# Patient Record
Sex: Male | Born: 1935 | Race: White | Hispanic: No | Marital: Married | Smoking: Former smoker
Health system: Southern US, Community
[De-identification: ages and names within clinical notes are randomized; demographics above are authoritative.]

## PROBLEM LIST (undated history)

## (undated) DIAGNOSIS — R410 Disorientation, unspecified: Secondary | ICD-10-CM

## (undated) DIAGNOSIS — M1990 Unspecified osteoarthritis, unspecified site: Secondary | ICD-10-CM

## (undated) DIAGNOSIS — E785 Hyperlipidemia, unspecified: Secondary | ICD-10-CM

## (undated) DIAGNOSIS — Z8619 Personal history of other infectious and parasitic diseases: Secondary | ICD-10-CM

## (undated) DIAGNOSIS — G9341 Metabolic encephalopathy: Secondary | ICD-10-CM

## (undated) DIAGNOSIS — I951 Orthostatic hypotension: Secondary | ICD-10-CM

## (undated) DIAGNOSIS — I2510 Atherosclerotic heart disease of native coronary artery without angina pectoris: Secondary | ICD-10-CM

## (undated) DIAGNOSIS — Z9289 Personal history of other medical treatment: Secondary | ICD-10-CM

## (undated) DIAGNOSIS — I10 Essential (primary) hypertension: Secondary | ICD-10-CM

## (undated) DIAGNOSIS — C801 Malignant (primary) neoplasm, unspecified: Secondary | ICD-10-CM

## (undated) HISTORY — DX: Metabolic encephalopathy: G93.41

## (undated) HISTORY — DX: Unspecified osteoarthritis, unspecified site: M19.90

## (undated) HISTORY — DX: Hyperlipidemia, unspecified: E78.5

## (undated) HISTORY — DX: Personal history of other medical treatment: Z92.89

## (undated) HISTORY — DX: Personal history of other infectious and parasitic diseases: Z86.19

## (undated) HISTORY — PX: LUMBAR DISC SURGERY: SHX700

## (undated) HISTORY — PX: BACK SURGERY: SHX140

## (undated) HISTORY — DX: Disorientation, unspecified: R41.0

## (undated) HISTORY — DX: Orthostatic hypotension: I95.1

## (undated) HISTORY — DX: Atherosclerotic heart disease of native coronary artery without angina pectoris: I25.10

## (undated) HISTORY — DX: Essential (primary) hypertension: I10

## (undated) HISTORY — PX: OTHER SURGICAL HISTORY: SHX169

---

## 1997-08-26 ENCOUNTER — Ambulatory Visit (HOSPITAL_COMMUNITY): Admission: RE | Admit: 1997-08-26 | Discharge: 1997-08-26 | Payer: Self-pay | Admitting: *Deleted

## 1997-09-09 ENCOUNTER — Ambulatory Visit (HOSPITAL_COMMUNITY): Admission: RE | Admit: 1997-09-09 | Discharge: 1997-09-09 | Payer: Self-pay | Admitting: *Deleted

## 1997-09-29 ENCOUNTER — Ambulatory Visit (HOSPITAL_COMMUNITY): Admission: RE | Admit: 1997-09-29 | Discharge: 1997-09-29 | Payer: Self-pay | Admitting: *Deleted

## 1998-01-04 ENCOUNTER — Encounter: Payer: Self-pay | Admitting: *Deleted

## 1998-01-04 ENCOUNTER — Inpatient Hospital Stay (HOSPITAL_COMMUNITY): Admission: RE | Admit: 1998-01-04 | Discharge: 1998-01-05 | Payer: Self-pay | Admitting: *Deleted

## 1999-03-19 ENCOUNTER — Encounter: Admission: RE | Admit: 1999-03-19 | Discharge: 1999-03-19 | Payer: Self-pay | Admitting: Family Medicine

## 1999-03-20 ENCOUNTER — Encounter: Admission: RE | Admit: 1999-03-20 | Discharge: 1999-03-20 | Payer: Self-pay | Admitting: Family Medicine

## 1999-03-20 ENCOUNTER — Encounter: Payer: Self-pay | Admitting: Family Medicine

## 1999-04-16 HISTORY — PX: CORONARY ARTERY BYPASS GRAFT: SHX141

## 1999-04-17 ENCOUNTER — Inpatient Hospital Stay (HOSPITAL_COMMUNITY): Admission: EM | Admit: 1999-04-17 | Discharge: 1999-04-26 | Payer: Self-pay | Admitting: Emergency Medicine

## 1999-04-17 ENCOUNTER — Encounter: Payer: Self-pay | Admitting: Psychiatry

## 1999-04-20 ENCOUNTER — Encounter: Payer: Self-pay | Admitting: Thoracic Surgery (Cardiothoracic Vascular Surgery)

## 1999-04-21 ENCOUNTER — Encounter: Payer: Self-pay | Admitting: Thoracic Surgery (Cardiothoracic Vascular Surgery)

## 1999-04-22 ENCOUNTER — Encounter: Payer: Self-pay | Admitting: Thoracic Surgery (Cardiothoracic Vascular Surgery)

## 1999-04-23 ENCOUNTER — Encounter: Payer: Self-pay | Admitting: Thoracic Surgery (Cardiothoracic Vascular Surgery)

## 1999-04-24 ENCOUNTER — Encounter: Payer: Self-pay | Admitting: Thoracic Surgery (Cardiothoracic Vascular Surgery)

## 1999-04-25 ENCOUNTER — Encounter: Payer: Self-pay | Admitting: Thoracic Surgery (Cardiothoracic Vascular Surgery)

## 2005-05-09 ENCOUNTER — Ambulatory Visit: Payer: Self-pay | Admitting: Family Medicine

## 2005-06-27 ENCOUNTER — Ambulatory Visit: Payer: Self-pay | Admitting: Family Medicine

## 2005-06-29 ENCOUNTER — Encounter: Admission: RE | Admit: 2005-06-29 | Discharge: 2005-06-29 | Payer: Self-pay | Admitting: Family Medicine

## 2006-01-02 ENCOUNTER — Ambulatory Visit: Payer: Self-pay | Admitting: Family Medicine

## 2006-02-02 ENCOUNTER — Encounter: Admission: RE | Admit: 2006-02-02 | Discharge: 2006-02-02 | Payer: Self-pay | Admitting: Family Medicine

## 2006-03-12 ENCOUNTER — Ambulatory Visit (HOSPITAL_COMMUNITY): Admission: RE | Admit: 2006-03-12 | Discharge: 2006-03-13 | Payer: Self-pay | Admitting: Orthopaedic Surgery

## 2007-03-31 ENCOUNTER — Ambulatory Visit: Payer: Self-pay | Admitting: Family Medicine

## 2007-03-31 DIAGNOSIS — I2510 Atherosclerotic heart disease of native coronary artery without angina pectoris: Secondary | ICD-10-CM

## 2007-03-31 DIAGNOSIS — K589 Irritable bowel syndrome without diarrhea: Secondary | ICD-10-CM | POA: Insufficient documentation

## 2007-03-31 DIAGNOSIS — I10 Essential (primary) hypertension: Secondary | ICD-10-CM

## 2007-03-31 DIAGNOSIS — E785 Hyperlipidemia, unspecified: Secondary | ICD-10-CM

## 2007-04-13 ENCOUNTER — Ambulatory Visit: Payer: Self-pay | Admitting: Gastroenterology

## 2007-04-16 LAB — HM COLONOSCOPY: HM Colonoscopy: NORMAL

## 2007-04-27 ENCOUNTER — Ambulatory Visit: Payer: Self-pay | Admitting: Gastroenterology

## 2007-04-27 ENCOUNTER — Encounter: Payer: Self-pay | Admitting: Family Medicine

## 2007-05-19 ENCOUNTER — Ambulatory Visit: Payer: Self-pay | Admitting: Gastroenterology

## 2007-05-19 LAB — CONVERTED CEMR LAB
ALT: 14 units/L (ref 0–53)
AST: 23 units/L (ref 0–37)
Albumin: 2.9 g/dL — ABNORMAL LOW (ref 3.5–5.2)
Alkaline Phosphatase: 73 units/L (ref 39–117)
Amylase: 47 units/L (ref 27–131)
BUN: 11 mg/dL (ref 6–23)
Basophils Relative: 0 % (ref 0.0–1.0)
Calcium: 8.7 mg/dL (ref 8.4–10.5)
Chloride: 106 meq/L (ref 96–112)
Eosinophils Relative: 7.5 % — ABNORMAL HIGH (ref 0.0–5.0)
Ferritin: 8.7 ng/mL — ABNORMAL LOW (ref 22.0–322.0)
Folate: 7.2 ng/mL
GFR calc non Af Amer: 87 mL/min
Iron: 41 ug/dL — ABNORMAL LOW (ref 42–165)
Lipase: 16 units/L (ref 11.0–59.0)
Monocytes Relative: 6.6 % (ref 3.0–11.0)
Platelets: 225 10*3/uL (ref 150–400)
RDW: 14.5 % (ref 11.5–14.6)
Sed Rate: 7 mm/hr (ref 0–20)
TSH: 2.9 microintl units/mL (ref 0.35–5.50)
Vitamin B-12: 223 pg/mL (ref 211–911)
WBC: 5.7 10*3/uL (ref 4.5–10.5)

## 2007-05-20 ENCOUNTER — Ambulatory Visit: Payer: Self-pay | Admitting: Gastroenterology

## 2007-05-20 ENCOUNTER — Encounter: Payer: Self-pay | Admitting: Family Medicine

## 2007-05-20 ENCOUNTER — Encounter: Payer: Self-pay | Admitting: Gastroenterology

## 2007-05-21 ENCOUNTER — Ambulatory Visit: Payer: Self-pay | Admitting: Cardiology

## 2007-05-22 ENCOUNTER — Observation Stay (HOSPITAL_COMMUNITY): Admission: EM | Admit: 2007-05-22 | Discharge: 2007-05-23 | Payer: Self-pay | Admitting: Internal Medicine

## 2007-05-22 ENCOUNTER — Ambulatory Visit: Payer: Self-pay | Admitting: Vascular Surgery

## 2007-05-29 ENCOUNTER — Ambulatory Visit: Payer: Self-pay | Admitting: Internal Medicine

## 2007-06-10 ENCOUNTER — Encounter: Payer: Self-pay | Admitting: Family Medicine

## 2007-06-10 ENCOUNTER — Ambulatory Visit: Payer: Self-pay | Admitting: Vascular Surgery

## 2007-07-16 ENCOUNTER — Encounter: Payer: Self-pay | Admitting: Family Medicine

## 2007-07-17 ENCOUNTER — Encounter: Payer: Self-pay | Admitting: Family Medicine

## 2007-11-11 ENCOUNTER — Ambulatory Visit: Payer: Self-pay | Admitting: Family Medicine

## 2007-11-11 DIAGNOSIS — Z8711 Personal history of peptic ulcer disease: Secondary | ICD-10-CM

## 2007-11-11 DIAGNOSIS — G609 Hereditary and idiopathic neuropathy, unspecified: Secondary | ICD-10-CM

## 2007-11-11 DIAGNOSIS — M949 Disorder of cartilage, unspecified: Secondary | ICD-10-CM

## 2007-11-11 DIAGNOSIS — M899 Disorder of bone, unspecified: Secondary | ICD-10-CM | POA: Insufficient documentation

## 2007-11-11 DIAGNOSIS — D649 Anemia, unspecified: Secondary | ICD-10-CM | POA: Insufficient documentation

## 2007-11-11 DIAGNOSIS — K5909 Other constipation: Secondary | ICD-10-CM

## 2007-11-11 DIAGNOSIS — E039 Hypothyroidism, unspecified: Secondary | ICD-10-CM

## 2007-11-16 LAB — CONVERTED CEMR LAB
Basophils Absolute: 0 10*3/uL (ref 0.0–0.1)
Hemoglobin: 12.7 g/dL — ABNORMAL LOW (ref 13.0–17.0)
Lymphocytes Relative: 27.2 % (ref 12.0–46.0)
MCHC: 35 g/dL (ref 30.0–36.0)
Neutro Abs: 2.5 10*3/uL (ref 1.4–7.7)
RDW: 13.2 % (ref 11.5–14.6)
Vitamin B-12: 204 pg/mL — ABNORMAL LOW (ref 211–911)

## 2007-12-22 ENCOUNTER — Telehealth: Payer: Self-pay | Admitting: Family Medicine

## 2008-04-26 ENCOUNTER — Encounter: Payer: Self-pay | Admitting: Family Medicine

## 2008-06-03 ENCOUNTER — Ambulatory Visit: Payer: Self-pay | Admitting: Family Medicine

## 2008-06-03 DIAGNOSIS — M67919 Unspecified disorder of synovium and tendon, unspecified shoulder: Secondary | ICD-10-CM | POA: Insufficient documentation

## 2008-06-03 DIAGNOSIS — M719 Bursopathy, unspecified: Secondary | ICD-10-CM

## 2008-07-20 ENCOUNTER — Ambulatory Visit: Payer: Self-pay | Admitting: Family Medicine

## 2008-07-20 DIAGNOSIS — M129 Arthropathy, unspecified: Secondary | ICD-10-CM

## 2008-07-20 DIAGNOSIS — M19019 Primary osteoarthritis, unspecified shoulder: Secondary | ICD-10-CM | POA: Insufficient documentation

## 2008-07-20 DIAGNOSIS — M7500 Adhesive capsulitis of unspecified shoulder: Secondary | ICD-10-CM

## 2008-07-20 LAB — CONVERTED CEMR LAB: Hemoglobin: 12.1 g/dL

## 2008-08-01 ENCOUNTER — Encounter: Payer: Self-pay | Admitting: Family Medicine

## 2008-09-20 DIAGNOSIS — Z9289 Personal history of other medical treatment: Secondary | ICD-10-CM

## 2008-09-20 HISTORY — DX: Personal history of other medical treatment: Z92.89

## 2009-02-23 ENCOUNTER — Ambulatory Visit: Payer: Self-pay | Admitting: Family Medicine

## 2009-02-23 DIAGNOSIS — R42 Dizziness and giddiness: Secondary | ICD-10-CM | POA: Insufficient documentation

## 2009-03-03 ENCOUNTER — Encounter: Payer: Self-pay | Admitting: Family Medicine

## 2009-04-04 ENCOUNTER — Telehealth: Payer: Self-pay | Admitting: Family Medicine

## 2009-11-17 ENCOUNTER — Ambulatory Visit: Payer: Self-pay | Admitting: Family Medicine

## 2009-11-17 LAB — CONVERTED CEMR LAB
Bilirubin Urine: NEGATIVE
Ketones, urine, test strip: NEGATIVE
Protein, U semiquant: NEGATIVE
Urobilinogen, UA: 0.2

## 2009-11-21 ENCOUNTER — Encounter: Payer: Self-pay | Admitting: Family Medicine

## 2009-11-21 LAB — CONVERTED CEMR LAB
ALT: 15 units/L (ref 0–53)
Basophils Relative: 0.4 % (ref 0.0–3.0)
Bilirubin, Direct: 0.1 mg/dL (ref 0.0–0.3)
CO2: 30 meq/L (ref 19–32)
Calcium: 10.6 mg/dL — ABNORMAL HIGH (ref 8.4–10.5)
Eosinophils Relative: 2 % (ref 0.0–5.0)
HCT: 44 % (ref 39.0–52.0)
Lymphs Abs: 1.8 10*3/uL (ref 0.7–4.0)
MCV: 93.7 fL (ref 78.0–100.0)
Monocytes Absolute: 0.5 10*3/uL (ref 0.1–1.0)
Potassium: 5.5 meq/L — ABNORMAL HIGH (ref 3.5–5.1)
RBC: 4.7 M/uL (ref 4.22–5.81)
Sodium: 142 meq/L (ref 135–145)
Total Protein: 7.8 g/dL (ref 6.0–8.3)
WBC: 7 10*3/uL (ref 4.5–10.5)

## 2009-11-29 ENCOUNTER — Ambulatory Visit: Payer: Self-pay | Admitting: Family Medicine

## 2009-11-29 LAB — CONVERTED CEMR LAB
Albumin: 3.6 g/dL (ref 3.5–5.2)
Creatinine, Ser: 0.9 mg/dL (ref 0.4–1.5)
Glucose, Bld: 81 mg/dL (ref 70–99)
Phosphorus: 3.3 mg/dL (ref 2.3–4.6)
Potassium: 4 meq/L (ref 3.5–5.1)
Sodium: 142 meq/L (ref 135–145)

## 2009-12-21 ENCOUNTER — Ambulatory Visit: Payer: Self-pay | Admitting: Family Medicine

## 2010-05-06 ENCOUNTER — Encounter: Payer: Self-pay | Admitting: Gastroenterology

## 2010-05-15 ENCOUNTER — Encounter: Payer: Self-pay | Admitting: Family Medicine

## 2010-05-15 NOTE — Letter (Signed)
Summary: Generic Letter  [place] [facility]  [address] [place], [place] [address]   Phone: [phone]  Fax: [phone]    11/21/2009  Austin Rose [address] [place], [place]  [address]  Dear [name],   Dr Abner Greenspan would like you to know that your TSH and CBC are normal. Your calcium and potassium are midly high, so please decrease these in your diet.  Dr Abner Greenspan would like you to come back towards the end of this week or beginning of next week to recheck your renal panel. Please contact our office for an appointment in the lab.  Any other questions or concerns please call [phone].     Sincerely,   Josph Macho RMA

## 2010-05-15 NOTE — Assessment & Plan Note (Signed)
Summary: 77yo ROV/cb   Vital Signs:  Patient profile:   77 year old male Height:      68 inches (172.72 cm) Weight:      162 pounds (73.64 kg) O2 Sat:      98 % on Room air Temp:     98.0 degrees F (36.67 degrees C) oral Pulse rate:   60 / minute BP sitting:   100 / 66  (left arm) Cuff size:   regular  Vitals Entered By: Josph Macho RMA (December 21, 2009 11:28 AM)  O2 Flow:  Room air CC: 1 month office visit/ CF Is Patient Diabetic? No   History of Present Illness: Patient in today for reevaluation. Last month he came in feeling weak and light headed. He was noted to be dry and on multiple meds tha tcould make him light headed. We dropped his Temazepam down from 30 to 15 mg and he is reporting that he continues to sleep well and is having decreased episodes of feeling light headed. The episodes often occur with arising and quick changes in position. No associated symptoms such as syncope, presyncope, palpitations, CP, SOB, HA or other neurologic complaints. He reports he has improved his by mouth  intake is eating more meals and less snack food and has increased his clear fluid intake. His only complaint of is his persistent burning in his feet and an occasional twitch in his left foot usually when he is sleeping at night. He denies pain/swelling but notes if he gets up and walks around and takes a Lorcet the twitch resolves. [facility] in [facility] couple weeks. He continues to have some heartburn at times but did not pick up his Ranitidine. He reports he forgot we sent it in.  Current Medications (verified): 1)  Simvastatin 40 Mg  Tabs (Simvastatin) .... 1/2 Tablet Daily 2)  Lorcet 10/650 10-650 Mg  Tabs (Hydrocodone-Acetaminophen) .[name].. 1 Q4h As Needed Pain ,not Over 4 Per Day 3)  Ferrous Sulfate 325 (65 Fe) Mg  Tabs (Ferrous Sulfate) 4)  B Complex Vitamins  Caps (B Complex Vitamins) .[name].. 1 Bid 5)  Temazepam 15 Mg Caps (Temazepam) .[name].. 1 Cap By Mouth At Bedtime As  Needed Insomnia 6)  Ranitidine Hcl 150 Mg Tabs (Ranitidine Hcl) .[name].. 1 Tab By Mouth Bid  Allergies (verified): 1)  ! Pcn  Past History:  Past medical history reviewed for relevance to current acute and chronic problems. Social history (including risk factors) reviewed for relevance to current acute and chronic problems.  Past Medical History: Reviewed history from 03/31/2007 and no changes required. ARTHIRITS Coronary artery disease Hypertension Hyperlipidemia  Social History: Reviewed history and no changes required.  Review of Systems      See HPI  Physical Exam  General:  Well-developed,well-nourished,in no acute distress; alert,appropriate and cooperative throughout examination Head:  Normocephalic and atraumatic without obvious abnormalities. No apparent alopecia or balding. Mouth:  Slightly dry mucus membranes Neck:  No deformities, masses, or tenderness noted. Lungs:  Normal respiratory effort, chest expands symmetrically. Lungs are clear to auscultation, no crackles or wheezes. Heart:  Normal rate and regular rhythm. S1 and S2 normal without gallop, click, rub or other extra sounds. Abdomen:  Bowel sounds positive,abdomen soft and non-tender without masses, organomegaly or hernias noted. Extremities:  No clubbing, cyanosis, edema, or deformity noted  Psych:  Cognition and judgment appear intact. Alert and cooperative with normal attention span and concentration. No apparent delusions, illusions, hallucinations  Impression & Recommendations:  Problem # 1:  DIZZINESS (ICD-780.4) Improved with decreased Temazepam offered to decrease dosing further but patient declined for now. If symptoms worsen he will call for reevaluation and decrease in meds. Still under hydrating, recommend increasing fluids more on a daily basis. Report any worsening of symptoms  Problem # 2:  GASTRIC ULCER, HX OF (ICD-V12.71)  His updated medication list for this problem includes:     Ranitidine Hcl 150 Mg Tabs (Ranitidine hcl) .[name]... 1 tab by mouth bid Restarted med and report worsening symptoms  Problem # 3:  HYPOTHYROIDISM (ICD-244.9) Well treated is having labs drawn at [facility] later this month, will bring [place] results  Problem # 4:  PERIPHERAL NEUROPATHY (ICD-356.9) Will continue Gabapentin two times a day for now, would consider increasing to see if symptoms improve but given his current symptoms will hold off  Complete Medication List: 1)  Simvastatin 40 Mg Tabs (Simvastatin) .... 1/2 tablet daily 2)  Lorcet 10/650 10-650 Mg Tabs (Hydrocodone-acetaminophen) .[name].. 1 q4h as needed pain ,not over 4 per day 3)  Ferrous Sulfate 325 (65 Fe) Mg Tabs (Ferrous sulfate) 4)  B Complex Vitamins Caps (B complex vitamins) .[name].. 1 bid 5)  Temazepam 15 Mg Caps (Temazepam) .[name].. 1 cap by mouth at bedtime as needed insomnia 6)  Ranitidine Hcl 150 Mg Tabs (Ranitidine hcl) .[name].. 1 tab by mouth bid  Patient Instructions: 1)  Please schedule a follow-up appointment in 3 months .  2)  Make sure you drink plenty of fluids, roughly 64 oz daily Prescriptions: RANITIDINE HCL 150 MG TABS (RANITIDINE HCL) 1 tab by mouth bid  #60 x 1   Entered and Authorized by:   Danise Edge MD   Signed by:   Danise Edge MD on 12/21/2009   Method used:   [facility]        [facility] [address] [phone]* (retail)       [address]       [place], [place]  [address]       Ph: [phone] or [phone]       Fax: [phone]   RxID:   [id number]

## 2010-05-15 NOTE — Assessment & Plan Note (Signed)
Summary: dizziness/njr   Vital Signs:  Patient profile:   77 year old male Height:      68 inches (172.72 cm) Weight:      156 pounds (70.91 kg) BMI:     23.81 O2 Sat:      87 % on Room air Temp:     97.8 degrees F (36.56 degrees C) oral Pulse rate:   61 / minute BP sitting:   120 / 82  (left arm) Cuff size:   regular  Vitals Entered By: Josph Macho RMA (November 17, 2009 1:52 PM)  O2 Flow:  Room air  Serial Vital Signs/Assessments:  Time      Position  BP       Pulse  Resp  Temp     By                              62                    Danise Edge MD                                PEF    PreRx  PostRx Time      O2 Sat  O2 Type     L/min  L/min  L/min   By           93  %   Room air                          Danise Edge MD  CC: Dizziness X1 week/ CF Is Patient Diabetic? No   History of Present Illness: Patient in today with his wife for evaluation of dizziness. he has been having trouble for quite some time but over the past week it had become slightly more pronounced. He is seeing Dr Scotty Court here but also seeing the [place]. His wife is accompanying him and she reports he does not take his meds as prescribed, nor does he eat well or hydrate well. Patient agrees with her upon questioning. He skips meals, eats alot of junk food and simple carbs. Does not drink 64 oz of fluids daily. He has not suffered any falls or had any head trauma. He has more of a sense of being off balance and a little light headed. No sense of true spinning. It happens upon arising quickly or after prolonged walking. No associated CP/palp/SOB/f/c/GI or GU c/o. Meds are reviewed and he is not taking most of his meds. No Maxzide, no prevacid and he has had some increase in heartburn since he stopped it. Is taking Tums frequently and still having dyspepsia.  Current Medications (verified): 1)  Simvastatin 40 Mg  Tabs (Simvastatin) .... 1/2 Tablet Daily 2)  Temazepam 30 Mg  Caps (Temazepam) .[name].. 1 At Bedtime  As  Needed  Sleep 3)  Prochlorperazine Maleate 10 Mg  Tabs (Prochlorperazine Maleate) .[name].. 1 Every 6-8 Hrs To Prevent Nausea 4)  Gabapentin 300 Mg  Caps (Gabapentin) .... Take 1 By Mouth Fro 1 Day Then 1 By Mouth Twice A Day Then Take 1 By Mouth Three Times A Day 5)  Lorcet 10/650 10-650 Mg  Tabs (Hydrocodone-Acetaminophen) .[name].. 1 Q4h As Needed Pain ,not Over 4 Per Day 6)  Ferrous Sulfate 325 (65 Fe) Mg  Tabs (Ferrous Sulfate) 7)  Diclofenac Sodium 75 Mg Tbec (Diclofenac Sodium) .[name].. 1 Two Times A Day For Arthritis 8)  Prevacid 15 Mg Cpdr (Lansoprazole) .... 2 Qd 9)  Amitiza 24 Mcg Caps (Lubiprostone) .[name].. 1 Each Day For Constipation 10)  B Complex Vitamins  Caps (B Complex Vitamins) .[name].. 1 Bid 11)  Maxzide 75-50 Mg Tabs (Triamterene-Hctz) .[name].. 1 Tab Each Morning For Edem  Allergies (verified): 1)  ! Pcn  Past History:  Past medical history reviewed for relevance to current acute and chronic problems. Social history (including risk factors) reviewed for relevance to current acute and chronic problems.  Past Medical History: Reviewed history from 03/31/2007 and no changes required. ARTHIRITS Coronary artery disease Hypertension Hyperlipidemia  Social History: Reviewed history and no changes required.  Review of Systems      See HPI  Physical Exam  General:  Well-developed,well-nourished,in no acute distress; alert,appropriate and cooperative throughout examination Head:  Normocephalic and atraumatic without obvious abnormalities. No apparent alopecia or balding. Eyes:  No corneal or conjunctival inflammation noted. EOMI.  Ears:  External ear exam shows no significant lesions or deformities.  Otoscopic examination reveals clear canals, tympanic membranes are intact bilaterally without bulging, retraction, inflammation or discharge. Hearing is grossly normal bilaterally. Nose:  External nasal examination shows no deformity or inflammation. Nasal mucosa are pink and moist without  lesions or exudates. Mouth:  dry mucus membranes Neck:  No deformities, masses, or tenderness noted. Lungs:  Normal respiratory effort, chest expands symmetrically. Lungs are clear to auscultation, no crackles or wheezes. Heart:  Normal rate and regular rhythm. S1 and S2 normal without gallop, click, rub or other extra sounds.grade 2 /6 systolic murmur.   Abdomen:  Bowel sounds positive,abdomen soft and non-tender without masses, organomegaly or hernias noted. Extremities:  No clubbing, cyanosis, edema, or deformity noted with normal full range of motion of all joints.   Neurologic:  No cranial nerve deficits noted. Station and gait are normal. Plantar reflexes are down-going bilaterally. DTRs are symmetrical throughout. Sensory, motor and coordinative functions appear intact. Cervical Nodes:  No lymphadenopathy noted Psych:  Cognition and judgment appear intact. Alert and cooperative with normal attention span and concentration. No apparent delusions, illusions, hallucinations   Impression & Recommendations:  Problem # 1:  DIZZINESS (ICD-780.4)  The following medications were removed from the medication list:    Meclizine Hcl 25 Mg Tabs (Meclizine hcl) .[name]... 1 morn midafternoon and hs to prevent dizziness Symptoms more suggestive of dehydration and postural hypotension. Encouraged improved hydration and small frequent meals with lean proteins and complex carbs. Report worsening or persistent symptoms  Problem # 2:  GASTRIC ULCER, HX OF (ICD-V12.71)  The following medications were removed from the medication list:    Prevacid 15 Mg Cpdr (Lansoprazole) .[name]... 2 qd His updated medication list for this problem includes:    Ranitidine Hcl 150 Mg Tabs (Ranitidine hcl) .[name]... 1 tab by mouth bid Had stopped the Prevacid will have him start Ranitidine and if inadequate response will restart PPI, avoid offending foods  Problem # 3:  HYPERTENSION (ICD-401.9)  The following medications were removed  from the medication list:    Maxzide 75-50 Mg Tabs (Triamterene-hctz) .[name]... 1 tab each morning for edem Well controlled at today's visit, no change to meds  Problem # 4:  HYPOTHYROIDISM (ICD-244.9) Due to fatigue/light headedness repeat TSH, CBC and renal panel drawn today no change to therapy  Complete Medication List: 1)  Simvastatin 40 Mg Tabs (Simvastatin) .... 1/2 tablet daily 2)  Lorcet  10/650 10-650 Mg Tabs (Hydrocodone-acetaminophen) .[name].. 1 q4h as needed pain ,not over 4 per day 3)  Ferrous Sulfate 325 (65 Fe) Mg Tabs (Ferrous sulfate) 4)  B Complex Vitamins Caps (B complex vitamins) .[name].. 1 bid 5)  Temazepam 15 Mg Caps (Temazepam) .[name].. 1 cap by mouth at bedtime as needed insomnia 6)  Ranitidine Hcl 150 Mg Tabs (Ranitidine hcl) .[name].. 1 tab by mouth bid  Other Orders: Urinalysis-dipstick only (Medicare patient) ([address]UE) Venipuncture ([address]) Specimen Handling ([address]) TLB-Renal Function Panel (80069-RENAL) TLB-CBC Platelet - w/Differential (85025-CBCD) TLB-Hepatic/Liver Function Pnl (80076-HEPATIC) TLB-TSH (Thyroid Stimulating Hormone) (47829-FAO)  Patient Instructions: 1)  Please schedule a follow-up appointment in 1 month bring meds to visit. 2)  Stop the Temazepam 30mg  and try lesser 15mg  dose as needed for poor sleep 3)  Need to eat healthy food every 4 hours, avoid simple carbs, drink 64 oz of clear liquids daily 4)  Report any concerning symptoms 5)  Take the Ranitidine twice daily for heartburn Prescriptions: RANITIDINE HCL 150 MG TABS (RANITIDINE HCL) 1 tab by mouth bid  #60 x 1   Entered and Authorized by:   Danise Edge MD   Signed by:   Danise Edge MD on 11/17/2009   Method used:   [facility]        [facility] [address] [phone]* (retail)       [address]       [place], [place]  [address]       Ph: [phone] or [phone]       Fax: [phone]   RxID:   [id number] TEMAZEPAM 15 MG CAPS (TEMAZEPAM) 1 cap by mouth at  bedtime as needed insomnia  #30 x 1   Entered and Authorized by:   Danise Edge MD   Signed by:   Danise Edge MD on 11/17/2009   Method used:   Print then Give to Patient   RxID:   [id number]   Laboratory Results   Urine Tests    Routine Urinalysis   Color: yellow Appearance: Clear Glucose: negative   (Normal Range: Negative) Bilirubin: negative   (Normal Range: Negative) Ketone: negative   (Normal Range: Negative) Spec. Gravity: >=1.030   (Normal Range: 1.003-1.035) Blood: negative   (Normal Range: Negative) pH: 6.0   (Normal Range: 5.0-8.0) Protein: negative   (Normal Range: Negative) Urobilinogen: 0.2   (Normal Range: 0-1) Nitrite: negative   (Normal Range: Negative) Leukocyte Esterace: negative   (Normal Range: Negative)    Comments: Rita Ohara  November 17, 2009 4:24 PM

## 2010-05-16 ENCOUNTER — Ambulatory Visit (INDEPENDENT_AMBULATORY_CARE_PROVIDER_SITE_OTHER): Payer: Medicare PPO | Admitting: Family Medicine

## 2010-05-16 ENCOUNTER — Encounter: Payer: Self-pay | Admitting: Family Medicine

## 2010-05-16 VITALS — BP 122/80 | HR 76 | Temp 98.1°F | Wt 162.0 lb

## 2010-05-16 DIAGNOSIS — M129 Arthropathy, unspecified: Secondary | ICD-10-CM

## 2010-05-16 DIAGNOSIS — H8309 Labyrinthitis, unspecified ear: Secondary | ICD-10-CM | POA: Insufficient documentation

## 2010-05-16 DIAGNOSIS — D649 Anemia, unspecified: Secondary | ICD-10-CM

## 2010-05-16 DIAGNOSIS — I951 Orthostatic hypotension: Secondary | ICD-10-CM | POA: Insufficient documentation

## 2010-05-16 LAB — HEMOGLOBIN: Hemoglobin: 14 g/dL (ref 13.5–17.5)

## 2010-05-16 NOTE — Progress Notes (Signed)
  Subjective:    Patient ID: Austin Rose, male    DOB: 1925/07/18, 77 y.o.   MRN: [id number]  HPIThis 77 yr old WM is  In complaining of dizziness for some \\time , in past has responded to meclizine. Stopped med and had bad episode 3 days ago . Has nausea rarely. Notices dizziness with changing position, more svee at times than other time. No chest pain nor dyspnea. Has been on Fe for anemia, no change in stools, no bleeding. No headaches nor weakness. Has ben seen at [facility] and no explanation    Review of Systems  Constitutional: Negative.   HENT: Negative.   Eyes: Negative.   Respiratory: Negative.   Cardiovascular: Negative.   Gastrointestinal: Negative.   Genitourinary: Negative.   Musculoskeletal: Positive for back pain and arthralgias.  Neurological: Positive for dizziness.  See HPI and review     Objective:   Physical Exam  Constitutional: He is oriented to person, place, and time. He appears well-developed and well-nourished. No distress.  HENT:  Head: Normocephalic and atraumatic.  Right Ear: External ear normal.  Left Ear: External ear normal.  Nose: Nose normal.  Mouth/Throat: Oropharynx is clear and moist.  Eyes: Right eye exhibits no discharge. Left eye exhibits no discharge.       No nystagmus  Cardiovascular: Normal rate, regular rhythm, normal heart sounds and intact distal pulses.  Exam reveals no gallop and no friction rub.   No murmur heard. Pulmonary/Chest: No respiratory distress. He has no wheezes. He has no rales. He exhibits no tenderness.  Neurological: He is alert and oriented to person, place, and time. He has normal reflexes.       Slight chaange in gait, rhomberg negative  Skin: He is not diaphoretic.  Psychiatric: He has a normal mood and affect.          Assessment & Plan:  Patient has had dizzines for sometime and thought to be chronic labyryrnthitis but now found to have orthostatic hypotension, sitting 120/78, standing 100/60 with  dizziness. To continue meclizine but to aADD HYDROflurocortisone .1 mg AM and midafternoon. Also to check Hgb today since has had anemia in past. To call or come in for follow up in 2 weeks. Continue other medications

## 2010-05-16 NOTE — Patient Instructions (Addendum)
Your blood pressure is dropping when you stand up called orthostatic hypotension I am calling in a new medication for you to take every day, continue taking hydroxyzine until  You n longer are dizzy To check blood count , hgb, today, will call results Call if not any better

## 2010-07-01 ENCOUNTER — Other Ambulatory Visit: Payer: Self-pay | Admitting: Family Medicine

## 2010-07-17 ENCOUNTER — Inpatient Hospital Stay (HOSPITAL_COMMUNITY)
Admission: EM | Admit: 2010-07-17 | Discharge: 2010-07-25 | DRG: 392 | Disposition: A | Discharge status: Skilled Nursing Facility | Payer: Medicare PPO | Attending: Internal Medicine | Admitting: Internal Medicine

## 2010-07-17 ENCOUNTER — Emergency Department (HOSPITAL_COMMUNITY): Payer: Medicare PPO

## 2010-07-17 DIAGNOSIS — I951 Orthostatic hypotension: Secondary | ICD-10-CM | POA: Diagnosis present

## 2010-07-17 DIAGNOSIS — E871 Hypo-osmolality and hyponatremia: Secondary | ICD-10-CM | POA: Diagnosis present

## 2010-07-17 DIAGNOSIS — I2510 Atherosclerotic heart disease of native coronary artery without angina pectoris: Secondary | ICD-10-CM | POA: Diagnosis present

## 2010-07-17 DIAGNOSIS — Z951 Presence of aortocoronary bypass graft: Secondary | ICD-10-CM

## 2010-07-17 DIAGNOSIS — K529 Noninfective gastroenteritis and colitis, unspecified: Principal | ICD-10-CM | POA: Diagnosis present

## 2010-07-17 DIAGNOSIS — H8110 Benign paroxysmal vertigo, unspecified ear: Secondary | ICD-10-CM | POA: Diagnosis present

## 2010-07-17 LAB — URINALYSIS, ROUTINE W REFLEX MICROSCOPIC
Bilirubin Urine: NEGATIVE
Glucose, UA: NEGATIVE mg/dL
Hgb urine dipstick: NEGATIVE
Ketones, ur: NEGATIVE mg/dL
Nitrite: NEGATIVE
Specific Gravity, Urine: 1.009 (ref 1.005–1.030)
pH: 7 (ref 5.0–8.0)

## 2010-07-17 LAB — COMPREHENSIVE METABOLIC PANEL
ALT: 19 U/L (ref 0–53)
AST: 20 U/L (ref 0–37)
Albumin: 3.4 g/dL — ABNORMAL LOW (ref 3.5–5.2)
CO2: 24 mEq/L (ref 19–32)
Calcium: 8.6 mg/dL (ref 8.4–10.5)
Creatinine, Ser: 1.05 mg/dL (ref 0.4–1.5)
GFR calc Af Amer: >60 mL/min (ref >60)
GFR calc non Af Amer: >60 mL/min (ref >60)
Sodium: 129 mEq/L — ABNORMAL LOW (ref 135–145)
Total Protein: 6.6 g/dL (ref 6.0–8.3)

## 2010-07-17 LAB — DIFFERENTIAL
Basophils Absolute: 0 10*3/uL (ref 0.0–0.1)
Basophils Relative: 0 % (ref 0–1)
Eosinophils Absolute: 0.1 10*3/uL (ref 0.0–0.7)
Monocytes Absolute: 1 10*3/uL (ref 0.1–1.0)
Neutro Abs: 11.7 10*3/uL — ABNORMAL HIGH (ref 1.7–7.7)
Neutrophils Relative %: 82 % — ABNORMAL HIGH (ref 43–77)

## 2010-07-17 LAB — CBC
Hemoglobin: 14.3 g/dL (ref 13.0–17.0)
MCH: 30.6 pg (ref 26.0–34.0)
MCHC: 33.7 g/dL (ref 30.0–36.0)
Platelets: 211 10*3/uL (ref 150–400)

## 2010-07-17 MED ORDER — IOHEXOL 300 MG/ML  SOLN
100.0000 mL | Freq: Once | INTRAMUSCULAR | Status: AC | PRN
  Administered 2010-07-17: 100 mL via INTRAVENOUS

## 2010-07-18 LAB — DIFFERENTIAL
Basophils Absolute: 0 10*3/uL (ref 0.0–0.1)
Eosinophils Relative: 1 % (ref 0–5)
Lymphocytes Relative: 16 % (ref 12–46)
Lymphs Abs: 1.4 10*3/uL (ref 0.7–4.0)
Monocytes Absolute: 0.4 10*3/uL (ref 0.1–1.0)
Monocytes Relative: 5 % (ref 3–12)
Neutro Abs: 6.6 10*3/uL (ref 1.7–7.7)

## 2010-07-18 LAB — LIPID PANEL
LDL Cholesterol: 64 mg/dL (ref 0–99)
Total CHOL/HDL Ratio: 2.1 RATIO
Triglycerides: 108 mg/dL (ref <150)
VLDL: 22 mg/dL (ref 0–40)

## 2010-07-18 LAB — CBC
HCT: 41.6 % (ref 39.0–52.0)
Hemoglobin: 13.4 g/dL (ref 13.0–17.0)
MCHC: 32.2 g/dL (ref 30.0–36.0)
MCV: 91.6 fL (ref 78.0–100.0)
RDW: 14.2 % (ref 11.5–15.5)

## 2010-07-18 LAB — BASIC METABOLIC PANEL
BUN: 17 mg/dL (ref 6–23)
CO2: 25 mEq/L (ref 19–32)
Calcium: 8.6 mg/dL (ref 8.4–10.5)
Glucose, Bld: 102 mg/dL — ABNORMAL HIGH (ref 70–99)
Sodium: 136 mEq/L (ref 135–145)

## 2010-07-18 LAB — TSH: TSH: 1.29 u[IU]/mL (ref 0.350–4.500)

## 2010-07-19 LAB — BASIC METABOLIC PANEL
CO2: 23 mEq/L (ref 19–32)
Chloride: 111 mEq/L (ref 96–112)
GFR calc Af Amer: >60 mL/min (ref >60)
Potassium: 3.8 mEq/L (ref 3.5–5.1)
Sodium: 139 mEq/L (ref 135–145)

## 2010-07-19 LAB — OVA AND PARASITE EXAMINATION

## 2010-07-19 LAB — CARDIAC PANEL(CRET KIN+CKTOT+MB+TROPI)
CK, MB: 0.9 ng/mL (ref 0.3–4.0)
Relative Index: INVALID (ref 0.0–2.5)
Total CK: 35 U/L (ref 7–232)

## 2010-07-19 LAB — CBC
Hemoglobin: 11.9 g/dL — ABNORMAL LOW (ref 13.0–17.0)
Platelets: 146 10*3/uL — ABNORMAL LOW (ref 150–400)
RBC: 4.08 MIL/uL — ABNORMAL LOW (ref 4.22–5.81)
WBC: 5.9 10*3/uL (ref 4.0–10.5)

## 2010-07-19 LAB — URINE CULTURE: Colony Count: 40000

## 2010-07-20 DIAGNOSIS — Z9289 Personal history of other medical treatment: Secondary | ICD-10-CM

## 2010-07-20 HISTORY — DX: Personal history of other medical treatment: Z92.89

## 2010-07-20 LAB — CORTISOL: Cortisol, Plasma: 11.8 ug/dL

## 2010-07-21 LAB — BASIC METABOLIC PANEL
Chloride: 107 mEq/L (ref 96–112)
Creatinine, Ser: 1.2 mg/dL (ref 0.4–1.5)
GFR calc Af Amer: >60 mL/min (ref >60)
Sodium: 137 mEq/L (ref 135–145)

## 2010-07-22 LAB — STOOL CULTURE

## 2010-07-23 LAB — BASIC METABOLIC PANEL
CO2: 24 mEq/L (ref 19–32)
Calcium: 8.5 mg/dL (ref 8.4–10.5)
GFR calc Af Amer: >60 mL/min (ref >60)
GFR calc non Af Amer: >60 mL/min (ref >60)
Sodium: 139 mEq/L (ref 135–145)

## 2010-07-23 NOTE — H&P (Signed)
NAME:  Austin, Rose NO.:  [id number]  MEDICAL RECORD NO.:  [id number]           PATIENT TYPE:  E  LOCATION:  [facility]                         FACILITY:  [facility]  PHYSICIAN:  Homero Fellers, MD   DATE OF BIRTH:  Aug 23, 1925  DATE OF ADMISSION:  07/17/2010 DATE OF DISCHARGE:                             HISTORY & PHYSICAL   PRIMARY CARE PHYSICIAN:  Dr. Scotty Court.  CHIEF COMPLAINT:  Diarrhea and weakness.  HISTORY OF PRESENT ILLNESS:  77 year old Caucasian gentleman who presented with profuse diarrhea for the past 2 days with vague abdominal pain.  The family described diarrhea of 10-15 times per day with no evidence of blood in the stool.  There is no vomiting or nausea.  There is no fever as well.  The patient has not been on any antibiotics recently and has had fairly good health.  He fell a few days ago and had to come to the emergency room for repair of the laceration on the back of his head.  There was no syncope present with the fall.  The patient denied any chest pain or shortness of breath.  No headaches, urinary symptoms, or leg swelling.  PAST MEDICAL HISTORY:  Significant for coronary artery disease status post CABG about 10 years ago, hyperlipidemia, and insomnia.  MEDICATIONS: 1. Meclizine 25 mg t.i.d. 2. Maxzide 75/50 one tab daily. 3. Omeprazole 20 mg daily. 4. Prednisone 20 mg daily. 5. Zocor 20 mg daily. 6. Temazepam 15 mg q.h.s.  ALLERGIES:  PENICILLIN.  SOCIAL HISTORY:  No smoking, alcohol, or drugs.  FAMILY HISTORY:  Noncontributory.  REVIEW OF SYSTEMS:  10-point review of systems is negative except as above.  PHYSICAL EXAMINATION:  VITAL SIGNS:  Blood pressure is 106/70, pulse 60, respirations 14, temperature is 98, O2 sat 96%. GENERAL EXAM:  The patient is comfortable, in no distress.  Appears weak and sleepy, but is arousable. NECK:  Supple. HEENT:  Mouth is dry. LUNGS:  Clear bilaterally to auscultation.  No wheezing or  crackles. HEART:  S1-S2.  No murmurs, rubs, or gallop. ABDOMEN:  Full, soft, vague lower abdominal tenderness.  Overt bowel sounds present.  No masses. EXTREMITIES:  No edema, clubbing, or cyanosis. NEUROLOGICAL EXAM:  Alert and oriented x3.  Cranial nerves 2-12 intact. Speech is clear.  No motor deficits. SKIN:  No rash or lesion.  LABORATORY:  White count is 14,000 with a left shift.  Hemoglobin is 14.3, platelet count is 211.  Chemistry:  Sodium is 129, potassium 4.1, BUN 25, creatinine 1.05.  Liver enzymes are normal.  Urinalysis is normal.  CT of the abdomen and pelvis with contrast showed evidence of proctitis and colitis, which favor infection.  There is also extensive aortic branch vessel atherosclerosis, including the superior mesenteric artery. There is no evidence of ischemic bowel however, reported.  He also has bilateral kidney stones and probable gallstones.  The prostate is also enlarged, with bladder wall thickening.  ASSESSMENT:  This is an 77 year old man admitted with: 1. Colitis and proctitis, likely secondary to infectious etiology. 2. Leukocytosis with a left shift. 3. Profuse diarrhea, likely secondary #1 above. 4.  Weakness, likely from excessive fluid loss. 5. Hyponatremia. 6. Clinical dehydration. 7. Coronary artery disease status post coronary artery bypass graft,     with no evidence of chest pain at this time.  PLAN:  Admit to Telemetry.  The patient will get blood culture, stool studies including stool culture, ova and parasite, and stool for C difficile.  Will put him on IV Levaquin and Flagyl.  Check PSA, follow electrolytes.  Place on gentle IV fluids to correct dehydration.  He will also be on DVT prophylaxis.  His condition is stable.     Homero Fellers, MD     FA/MEDQ  D:  07/17/2010  T:  07/17/2010  Job:  [id number]  Electronically Signed by Homero Fellers  on 07/23/2010 09:21:06 PM

## 2010-07-24 LAB — CULTURE, BLOOD (ROUTINE X 2)
Culture  Setup Time: 201204040332
Culture: NO GROWTH
Culture: NO GROWTH

## 2010-08-01 NOTE — Discharge Summary (Signed)
NAME:  Austin Rose, CHAPUT NO.:  [id number]  MEDICAL RECORD NO.:  [id number]           PATIENT TYPE:  I  LOCATION:  1438                         FACILITY:  [facility]  PHYSICIAN:  Clydia Llano, MD       DATE OF BIRTH:  [birth year]  DATE OF ADMISSION:  07/17/2010 DATE OF DISCHARGE:                        DISCHARGE SUMMARY - REFERRING   PRIMARY CARE PHYSICIAN:  Tawny Asal, MD  REASON FOR ADMISSION:  Diarrhea and weakness.  DISCHARGE DIAGNOSES: 1. Acute colitis and proctitis, assumed infectious. 2. Diarrhea, resolved. 3. Acute-on-chronic dizziness. 4. Orthostatic hypotension. 5. Benign paroxysmal positional vertigo, BPPV. 6. Hyponatremia. 7. History of coronary artery disease status post coronary artery     bypass grafting.  DISCHARGE MEDICATIONS: 1. Midodrine 5 mg p.o. 3 times daily with meals. 2. Ambien 5 mg daily at bedtime as needed for sleep. 3. Ferrous sulfate 325 mg p.o. daily. 4. Hydrocodone/APAP 10/650 mg p.o. every 4 hours as needed for pain. 5. Meclizine 25 mg 3 times a day to prevent dizziness. 6. Omeprazole 20 mg p.o. daily. 7. Simvastatin 40 mg half-tablet p.o. daily. 8. Vitamin B complex 1 tablet p.o. daily.  DISCONTINUED MEDICATIONS:  Stop taking the following medication: 1. Temazepam 15 mg daily at bedtime as needed. 2. Maxzide 75/50 p.o. daily.  BRIEF HISTORY AND EXAMINATION:  Austin Rose is an 77 year old Caucasian gentleman with history of CABG.  The patient brought to the hospital because of profuse diarrhea and abdominal pain.  Family described the diarrhea as 10 to 15 times per day with no evidence of blood in her stool.  There is no vomiting or nausea.  There is no fever as well.  The patient is not being on any antibiotic recently and has fairly been in good health.  Upon evaluation in the emergency room, the patient was found that he was here a few days ago and he has laceration in the back of his head secondary to a fall and  syncopal episode.  Initial evaluation with CT scan of abdomen showed proctitis/colitis and severe aortic arch atherosclerosis.  The patient admitted for further evaluation.  RADIOLOGIC DATA: 1. CT head without contrast showed no acute intracranial abnormalities     and small-vessel skin changes and brain atrophy. 2. CT abdomen and pelvis showed (a)  Proctitis/colitis, fever,     infection, inflammatory bowel disease can look similar but somewhat     atypical in this age group.  (b)  Extensive aortic and branch     vessel atherosclerosis including within the superior mesenteric     artery.  This could be hemodynamically significant and was detailed     in the prior study from February 2009, CT[facility].  However, the bowel     ischemia and does not typically involve the rectum/anus area.     There is a bilateral renal calculi.  BRIEF HOSPITAL STAY: 1. Proctitis/colitis, acute.  Probably, this is what caused the     abdominal pain and the diarrhea.  The patient tested negative for     C. diff.  Ova and parasites were negative.  Stool culture also is  negative.  The patient was treated with Cipro and Flagyl for 4 days     and then switched to Bactrim to complete total of 7 days of     antibiotics.  The patient is doing fine now, no pain, no diarrhea     since the second day. 2. Dizziness.  The patient has acute-on-chronic dizziness.  He has     exacerbation of his background of chronic dizziness.  The patient     evaluated by cardiology and occupational therapy.  The patient has     BPPV, benign paroxysmal positional vertigo, when he leans towards     the left side.  The patient also severely orthostatic.  It was felt     initially to be secondary to volume depletion from the diarrhea.     The patient had aggressive hydration with IV fluids, total of more     than 7 liters of IV fluids were given.  The patient was still     expressing symptoms of orthostatic hypotension.  Cardiology      recommended to start Midodrine at 5 mg and that helped somewhat.     The patient's echocardiogram showed grade 1 diastolic dysfunction     with left ventricular ejection fraction at about 58%.  Cardiology     recommended to continue the Midodrine, discontinue any blood     pressure medication as well as need followup as outpatient.  The     patient also needs compression stocking up to the thighs. 3. Hyponatremia.  The patient when came in he was mildly hyponatremic     with sodium of 129 and was probably secondary to dehydration.  The     very next day his sodium was corrected to 136.  The patient's     cortisol level was normal.  The patient's TSH level was normal. 4. Diarrhea which was secondary to colitis that has resolved     completely.  DISPOSITION:  Disposition to skilled nursing facility.  According to OT/PT recommendation.  DISCHARGE INSTRUCTIONS: 1. Diet, regular diet. 2. Activity as tolerated. 3. Disposition to skilled nursing facility. 4. Follow up with Dr. Rennis Golden with [facility]     [facility] for orthostatic hypotension.  SPECIAL INSTRUCTION:  Compression stockings up to the thigh bilaterally.     Clydia Llano, MD     ME/MEDQ  D:  07/24/2010  T:  07/24/2010  Job:  [id number]  cc:   Italy Hilty, MD  Electronically Signed by Clydia Llano  on 08/01/2010 08:17:26 PM

## 2010-08-06 ENCOUNTER — Telehealth: Payer: Self-pay | Admitting: *Deleted

## 2010-08-06 NOTE — Telephone Encounter (Signed)
Pt is refusing all PT and OT from [facility]

## 2010-08-14 NOTE — Telephone Encounter (Signed)
Will see pt this week

## 2010-08-16 ENCOUNTER — Ambulatory Visit (INDEPENDENT_AMBULATORY_CARE_PROVIDER_SITE_OTHER): Payer: Medicare PPO | Admitting: Family Medicine

## 2010-08-16 VITALS — BP 102/60 | HR 114 | Temp 98.5°F | Resp 16 | Wt 171.0 lb

## 2010-08-16 DIAGNOSIS — I951 Orthostatic hypotension: Secondary | ICD-10-CM

## 2010-08-16 DIAGNOSIS — M129 Arthropathy, unspecified: Secondary | ICD-10-CM

## 2010-08-16 DIAGNOSIS — H8309 Labyrinthitis, unspecified ear: Secondary | ICD-10-CM

## 2010-08-16 DIAGNOSIS — M1990 Unspecified osteoarthritis, unspecified site: Secondary | ICD-10-CM

## 2010-08-16 DIAGNOSIS — D649 Anemia, unspecified: Secondary | ICD-10-CM

## 2010-08-16 DIAGNOSIS — I2510 Atherosclerotic heart disease of native coronary artery without angina pectoris: Secondary | ICD-10-CM

## 2010-08-16 MED ORDER — MIDODRINE HCL 5 MG PO TABS: ORAL_TABLET | ORAL | Status: DC

## 2010-08-17 ENCOUNTER — Encounter: Payer: Self-pay | Admitting: Family Medicine

## 2010-08-17 ENCOUNTER — Other Ambulatory Visit: Payer: Self-pay | Admitting: Family Medicine

## 2010-08-17 NOTE — Progress Notes (Signed)
  Subjective:    Patient ID: Austin Rose, male    DOB: 01/03/1926, 77 y.o.   MRN: [id number] This 77 year old white male pain or was recently admitted to the hospital with diverticulitis and a problem with orthostatic hypotension, dizziness and continued joint pain all of the hospitalization he was admitted to [facility] rehabilitation [facility] for one week and then seeing me one week later He relates he feels much better but continues to have dizziness unfortunately they left all the medicine for orthostatic hypotension the medicine is midodrine 5 mg t.i.d. And will be restarted he continues to take meclizine For his chronic arthritis painful shoulders knees and back he takes prednisone 20 mg per day as well as hydrocodone for pain Blood pressure is 102/60 today with his problem of dizziness or vertigo her recommended that he does not drive He was seen in the hospital x2 with cardiologist as well as in the past week and is doing fine IBS is well controlled      HPI    Review of Systemssee history of present illness     Objective:   Physical Exam weight 171 blood pressure 102/60 The patient is a frail well-developed well nourished male who is alert cooperative and pleasant and in no distress but it is noted when he stands from sitting position he is dizzy and asked to stand for some time before he walks HEENT  negative no nystagmus carotid pulses are good noted Dopplers were negative Heart no cardiomegaly heart sounds are good without murmurs regular Lungs decreased bowel sounds no rales no wheezing no dullness Abdomen liver spleen and kidneys are nonpalpable no masses felt bowel sounds were normal Rectal not done Extremities no edema Neurological negative        Assessment & Plan:  Orthostatic hypo-tension to treat with midodrine 5mg  1AM, ,1 6pm Arthritis continue prednisone 20 mg and use hydrocodone as needed Anemia continue for sulfate Chronic dizziness  continue meclizine as needed 25 mg t.i.d.

## 2010-08-21 ENCOUNTER — Other Ambulatory Visit: Payer: Self-pay

## 2010-08-21 MED ORDER — HYDROCODONE-ACETAMINOPHEN 10-650 MG PO TABS: 1.0000 | ORAL_TABLET | ORAL | Status: DC | PRN

## 2010-08-21 NOTE — Telephone Encounter (Signed)
rx phoned in to [facility] for hydrocodone-acet 10-650 With 5 refills

## 2010-08-28 NOTE — H&P (Signed)
NAME:  SEAN, MALINOWSKI NO.:  [id number]   MEDICAL RECORD NO.:  [id number]          PATIENT TYPE:  INP   LOCATION:  5114                         FACILITY:  [facility]   PHYSICIAN:  Iva Boop, MD,FACGDATE OF BIRTH:  Feb 25, 1926   DATE OF ADMISSION:  05/22/2007  DATE OF DISCHARGE:  05/23/2007                              HISTORY & PHYSICAL   CHIEF COMPLAINT:  Anorexia and weight loss.   HISTORY OF PRESENT ILLNESS:  This is a 77 year old white male who was  seen October 3 by Dr. Jarold Motto in the office.  The patient was referred  because of a 40 pound weight loss over the past 2 years, progressive  anorexia, early satiety.  He does have chronic constipation, but this is  not a new problem.  He underwent upper endoscopy on February 4 showing  retained food, diffuse gastric and duodenal inflammation with  ulcerations, the pyloric outlet was stenosed and was dilated.  Overall,  the with imaging was concerning for ischemia of the gut.   The patient had a CT scan angiogram yesterday, showing extensive  atherosclerosis of the aortic and visceral branches.  The proximal SMA,  proximal right renal artery and left common femoral artery all showed at  least moderate to significant stenosis.  Incidentally, he also had renal  calculi bilaterally.   Dr. Jarold Motto is concerned that the patient could acutely thrombose his  mesenteric blood supply and have catastrophic problems.  So he made a  call to the patient and told him to come the hospital today for direct  admission.  The patient actually says that early satiety and burping,  which he had before the procedure, have improved.  He is now passing  more flatus.  He still is not eating much.  He had a bowel movement this  morning.  He has been taking his twice daily Aciphex which was  prescribed to him beginning on February 4.  The patient denies abdominal  pain.  He denies dizziness, denies chest pain, cough.   REVIEW OF  SYSTEMS:  He has slight chronic dependent left lower extremity  edema on the left leg which was where the graft was harvested from.  The  patient denies nocturia.  Has had increased flatus.  No presyncope.  No  significant fatigue.  No headaches.  No visual changes.  Does have some  lower extremity numbness.  No rash.  No history of thyroid disorders.  Otherwise, review of systems is negative.   ALLERGIES:  None.   MEDICATIONS:  1. Simvastatin 20 mg once daily.  2. Gabapentin 300 mg twice daily.  3. Aspirin 325 mg once daily.  4. Diclofenac twice daily.   PAST MEDICAL HISTORY:  1. Coronary artery disease.  He had a CABG in 2001.  2. Peripheral neuropathy  3. Cerebral small-vessel disease.  4. Hyperlipidemia.  5. Degenerative disk disease.  He underwent a laminectomy involving L3-      L4 and L5-S1 remotely, and a CT scan from shows that he has      recurrent degenerative disk disease and herniated disk.  SOCIAL HISTORY:  The patient lives in [place] with his wife.  He is a  retired Education administrator.  He has an eighth grade education.  He has greater than  50 pack-year history of smoking, which he quit in 1982, but he does  still chew tobacco.  Does not consume alcoholic beverages and has not  had history of excessive intake of alcohol history.   FAMILY HISTORY:  Noncontributory.   LABORATORIES:  From February 3, hemoglobin 11.7, hematocrit 35.8.  MCV  88.  Platelet count 225,000, white blood cell count 5.7.  B12 level 223,  folate level 7.2.  Sodium 140, potassium 4.8, chloride 106, CO2 30,  glucose 102, BUN 11, creatinine 0.9.  Total bilirubin 0.5, alkaline  phosphatase 73, AST 23, ALT 14.  Albumin 2.9.  Iron 41, iron saturation  13.9.  Transferrin level 210.2.  Amylase 47.  ESR 7.  Lipase 16.  Ferritin 8.7.  TSH 2.9.  H.  Pylori testing was negative.  These labs  are from February 3 with the exception of the H.  Pylori which was from  February 4.   PHYSICAL EXAMINATION:  VITAL  SIGNS:  Temperature 97.8, pulse 54,  respirations 21, blood pressure 128/76, room air saturation 96%.  Patient is a pleasant elderly white male.  He does not appear in any  distress.  Does not appear acutely or chronically ill.  HEENT: Exam sclerae are nonicteric.  Conjunctiva is pink.  Oropharynx  moist and clear.  NECK:  No JVD, no masses, no thyromegaly.  PULMONARY:  Clear to auscultation and percussion bilaterally.  Somewhat  diminished breath sounds.  No cough.  CARDIOVASCULAR:  Regular rate and rhythm.  S1-S2 audible.  No murmurs,  rubs or gallops.  GI:  Soft, nontender, nondistended.  Bowel sounds are active.  No  audible bruits.  No pulsatile masses.  RECTAL/GU:  Exams were deferred.  EXTREMITIES:  There is some slight nonpitting edema of the left lower  extremity.  Scars on his leg consistent with vein graft harvest in the  past.  Feet are warm and dry.  Dorsalis pedal pulses are 2-3+  bilaterally.  Capillary refill is not delayed.  Nails notable for fungal  type onychomycosis changes.  NEUROLOGIC:  No tremor.  The grip and pedal strength are 5/5  bilaterally.  Patient able to stand and sit up without assistance.[name]  He  is alert and oriented x3.  PSYCHIATRIC:  The patient appears to be in good spirits.   IMPRESSION:  1. Mesenteric ischemia.  Has what looks like ischemic type changes of      the gastric duodenum on recent endoscopy.  Fear that the patient      may acutely occlude his blood supply and causes catastrophic      ischemia and necrosis.  The patient being admitted to accelerate      his vascular workup.  2. History of coronary disease.  Status post coronary artery bypass      grafting in 2001.  3. Weight loss and anorexia secondary to #1.  4. Status post pyloric outlet obstruction with some retained food on      recent endoscopy.  The patient actually has a somewhat improved      food tolerance since the pylorus was dilated 2 days ago.   PLAN:  1. The patient  admitted to Dr. Izola Price service.  Calls have      been made to both [facility] as well as to  the      vascular surgeon for evaluation.  The patient will probably need a      mesenteric      angiogram, but will leave that decision to Dr. Darrick Penna, vascular      surgeon.  2. Will continue the patient's outpatient medications with the      exception of the diclofenac which we will hold.  Will substitute      with Tylenol for pain control.  Diet will be a heart healthy low      residue diet.      Jennye Moccasin, PA-C      Iva Boop, MD,FACG  Electronically Signed    SG/MEDQ  D:  05/22/2007  T:  05/25/2007  Job:  [id number]

## 2010-08-28 NOTE — Procedures (Signed)
MESENTERIC ARTERIAL DUPLEX EVALUATION   INDICATION:  Abdominal pain and weight loss.  Patient started or began  losing weight and appetite approximately 2 years ago.  Patient has had  recent weight gain since a colon procedure but still has lack of  appetite.   HISTORY:  Diabetes:  No.  Cardiac:  CABG x4 in 2003.  Hypertension:  No.  Smoking:  No.   Mesenteric Duplex Findings:  Aorta - Proximal                            37  Aorta - Mid                                 34  Aorta - Distal                              33   Celiac Trunk - Proximal (Origin)             202/50  Celiac Trunk - Distal                       196/52   Hepatic Artery                              135/42  Splenic Artery                              73/27   Superior Mesenteric Artery-Origin           140/35  Superior Mesenteric Artery-Proximal         192/43  Superior Mesenteric Artery-Mid              147/28  Superior Mesenteric Artery-Distal           196/52   Inferior Mesenteric Artery-Proximal         179   IMPRESSION:  Normal duplex evaluation without evidence of significant  flow restriction in the celiac, superior and inferior mesenteric  arteries.   ___________________________________________  Janetta Hora Fields, MD   PB/MEDQ  D:  06/10/2007  T:  06/11/2007  Job:  [id number]

## 2010-08-28 NOTE — Assessment & Plan Note (Signed)
[facility]                         GASTROENTEROLOGY OFFICE NOTE   NAME:Austin Rose, Austin Rose                     MRN:          [id number]  DATE:05/19/2007                            DOB:          1925/04/19    RE-DICTATION:  Austin Rose is a 77 year old white male referred for evaluation of  anorexia and 40-pound weight loss.   Austin Rose has had chronic constipation for many years, with laxative  dependency.  He had a negative colonoscopy several weeks in our  endoscopy lab.  He denies melena, hematochezia, or lower abdominal pain,  but does describe upper GI early satiety, nausea, and reflux symptoms,  without dysphagia or specific hepatobiliary complaints.  He has not had  prior endoscopies or ultrasound exams.  He has no history of hepatitis,  pancreatitis, or any symptoms such as clay-colored stool, dark urine,  icterus, fever, or chills.  He denies any specific food intolerances,  but has generalized anorexia.  His GI history is otherwise  noncontributory.   PAST MEDICAL HISTORY:  1. Coronary artery disease and hypercholesterolemia, with bypass      surgery in 2001.  2. He also has a peripheral neuropathy and is on gabapentin.   I do not have any recent laboratory data or x-ray reports from Dr.  Dianna Rose, his primary care physician.  He is followed by Dr.  Armanda Rose at [facility] for his cardiovascular problems.   In addition to the above-mentioned problems, he suffers from  hypercholesterolemia and has peripheral neuropathy.  He has mild COPD  with dyspnea on exertion.   DRUGS:  1. Simvastatin 20 mg a day.  2. Gabapentin 300 mg twice a day.  3. Aspirin 325 mg a day.  4. Diclofenac twice a day.   FAMILY HISTORY:  Noncontributory.   SOCIAL HISTORY:  He is married and lives with his wife.  He has an  eighth grade education.  Retired.  Worked as a Education administrator.  He used to  smoke a pack a day for 50 years, but quit in  1982, allegedly.  He  continues to use smokeless tobacco.  He does not abuse ethanol, and  gives no history of alcoholic dependency.   REVIEW OF SYSTEMS:  Otherwise negative, except for some dyspnea on  exertion.  He denies any current cardiovascular, genitourinary, other  neurological or psychiatric problems.   PHYSICAL EXAMINATION:  GENERAL:  He is an elderly, thin, slightly pale-  appearing white male in no acute distress.  VITAL SIGNS:  He is 5 feet 9-1/2 inches tall, and weighs 147 pounds.  Blood pressure 134/64, and pulse was 60 and regular.  HEENT:  I could not appreciate stigmata of chronic liver disease or  thyromegaly.  CHEST:  Generally clear.  HEART:  He was in a regular rhythm, without murmurs, gallops, or rubs.  ABDOMEN:  There was no hepatosplenomegaly, abdominal masses, or  tenderness.  EXTREMITIES:  Peripheral extremities were unremarkable.  PSYCHIATRIC:  Mental status was clear.  RECTAL:  Deferred.   ASSESSMENT:  1. Anorexia and weight loss, with early satiety, belching, and  burping, all suggestive of either an upper gastrointestinal      motility disorder or partial gastric outlet obstruction.  With his      age and weight loss, an occult malignancy is certainly possible,      although I can get no specific hepatobiliary complaints or history      of dysphagia or chronic GERD.  2. Chronic functional constipation, exacerbated by anorexia.  3. Hypercholesterolemia, with coronary artery disease and previous      bypass surgery.  4. History of peripheral neuropathy, on gabapentin.   RECOMMENDATIONS:  1. Screening laboratory parameters, including CBC, metabolic profile,      amylase, lipase, sed rate, and sprue panel, along with anemia      panel.  2. Outpatient ultrasound, endoscopic exam.  3. MiraLax at bedtime on a regular basis.  4. Trial of AcipHex 20 mg q.a.d.  5. Continue other medications per Dr. Scotty Rose.     Austin Rea. Jarold Motto, MD, Caleen Essex,  FAGA  Electronically Signed    DRP/MedQ  DD: 05/19/2007  DT: 05/19/2007  Job #: [id number]   cc:   Austin Rose., MD

## 2010-08-28 NOTE — Discharge Summary (Signed)
NAME:  CLAYTEN, ALLCOCK NO.:  [id number]   MEDICAL RECORD NO.:  [id number]          PATIENT TYPE:  INP   LOCATION:  5114                         FACILITY:  [facility]   PHYSICIAN:  Iva Boop, MD,FACGDATE OF BIRTH:  [birth year]   DATE OF ADMISSION:  05/22/2007  DATE OF DISCHARGE:  05/23/2007                               DISCHARGE SUMMARY   ADMISSION DIAGNOSES:  1. Anorexia.  2. Weight loss.  3. Mesenteric ischemia?   DISCHARGE DIAGNOSES:  1. Anorexia.  2. Weight loss.  3. Mesenteric ischemia?  4. Superior mesenteric artery stenosis only, likely does not have      significant mesenteric ischemia.   CONSULTATIONS:  1. [facility].  2. Janetta Hora. Darrick Penna, M.D.   HISTORY OF PRESENT ILLNESS:  Please see the admission note for full  details on this observation status patient.  Briefly, he was placed  under observation because of  concerns about diffuse mucosal ulceration  of the stomach and duodenum and a CT scan demonstrating significant  stenosis of his superior mesenteric artery.  He had an upper endoscopy  by Dr. Sheryn Bison on May 20, 2007 with the findings as above.  He had some pyloric stenosis, dilated, at that time. He actually feels a  little bit better and is able to eat somewhat better.  His diclofenac  and aspirin have been held.  Dr. Landry Dyke associates from [facility] saw the patient, as well as Dr. Darrick Penna, and neither  thought we needed to do any further work up at this time.  Dr. Darrick Penna  plans to see him back in the office and reassess his symptoms and  consider a mesenteric duplex scan looking for possible mesenteric  ischemia, otherwise.  Thus, the patient will be released from  observation.   DISCHARGE MEDICATIONS:  1. Simvastatin 20 mg daily.  2. Gabapentin 300 mg twice daily.  3. Zegerid 40 mg twice daily.   FOLLOWUP:  He is to call Dr. Darrick Penna' office for followup.  Followup will  be arranged  with Dr. Norval Gable office as well after the visit to Dr.  Darrick Penna.      Iva Boop, MD,FACG  Electronically Signed     CEG/MEDQ  D:  05/23/2007  T:  05/25/2007  Job:  [id number]   cc:   [name], MD, Caleen Essex, FAGA  Janetta Hora. Darrick Penna, MD  Nicki Guadalajara, M.D.

## 2010-08-28 NOTE — Assessment & Plan Note (Signed)
OFFICE VISIT   Austin Rose, Austin Rose  DOB:  02-Jul-1925                                       06/10/2007  CHART#:[id number]   The patient is a 77 year old male who was recently seen in the hospital  for possible mesenteric ischemia.  He had recently had a pyloric  dilatation for gastric outlet obstruction, and had had relief of his  symptoms from this.  He has had weight loss over the last year.  However, since having his gastric outlet obstruction corrected, he has  gained 7 to 8 pounds over the last 8 months.  He reports no postprandial  abdominal pain.  He has had no abdominal pain for the past 2 to 3 weeks.  He is eating 2 to 3 meals per day.  He does have some mild anorexia, but  in describing his diet and meals to me, it did not seem that  significant.  He states that the postprandial fullness that he had been  experiencing, has been better since his dilation procedure.   EXAM:  Blood pressure is 87/60 in the left arm, 115/69 in the right arm.  Abdomen is soft and nontender with no masses.  There are no bruits in  the abdomen.   His medications currently include AcipHex, simvastatin, gabapentin.  He  has no known drug allergies.   He had a mesenteric arterial duplex today, which showed no evidence of  significant flow restriction in the celiac, superior mesenteric artery,  or inferior mesenteric artery.  Of note, he recently had a CT angiogram,  which showed calcification around all of these arteries, but due to the  calcification, I believe artifact was limiting the CT ability to  determine stenosis.  I am much more convinced of the duplex findings.  This also agrees with his symptoms not being related to mesenteric  artery stenosis.   Overall, the patient has a duplex scan, which shows no significant flow  limiting lesions in his mesenteric arteries.  His symptoms have also  improved.  He did have some discrepancy of blood pressure in his left  arm,  suggesting subclavian artery stenosis, but this is asymptomatic.  I  believe the best option for him is to follow up in 6 months' time.  If  his symptoms continue to be improved after his dilatation procedure, I  do not think he needs any further intervention at this point.   Janetta Hora. Fields, MD  Electronically Signed   CEF/MEDQ  D:  06/10/2007  T:  06/11/2007  Job:  [id number]   cc:   Ellin Saba., MD  Vania Rea Jarold Motto, MD, Caleen Essex, FAGA

## 2010-08-31 NOTE — Op Note (Signed)
NAME:  CARMINO, OCAIN              ACCOUNT NO.:  [id number]   MEDICAL RECORD NO.:  [id number]          PATIENT TYPE:  AMB   LOCATION:  SDS                          FACILITY:  [facility]   PHYSICIAN:  Mark C. Ophelia Charter, M.D.    DATE OF BIRTH:  1925-07-06   DATE OF PROCEDURE:  03/12/2006  DATE OF DISCHARGE:                               OPERATIVE REPORT   PREOPERATIVE DIAGNOSES:  1. 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  2. Recurrent right L4-5 foraminal stenosis.   POSTOPERATIVE DIAGNOSES:  1. 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  2. Recurrent right L4-5 foraminal stenosis.   PROCEDURE:  Redo L4-5 decompression, bilaterally microdiskectomy, right  L5-S1 repeat foraminotomy and central decompression.   SURGEON:  Mark C. Ophelia Charter, M.D.   ASSISTANT:  Wende Neighbors, P.A.-C.   ANESTHESIA:  General plus local.   ESTIMATED BLOOD LOSS:  200 cc.   COMPLICATIONS:  None.   DESCRIPTION OF PROCEDURE:  After induction of general anesthesia and  orotracheal intubation, preoperative Ancef and time-out, standard  DuraPrep was used with the patient on the [place] frame.  The back was  prepped with DuraPrep and the area was squared with towels, Betadine, Vi-  Drape and laminectomy sheet and drape.  Needle localization with a  spinal needle.  Cross-table lateral x-ray confirmed the appropriate  level and a time-out was taken.  The old incision had been marked with a  sterile skin marker and a sterile skin marker was used to mark the place  for the needle, which was exactly at the 4-5 level.  Dissection started  caudally and the spinous process was half present at S1.  The lamina was  identified and the dura was exposed after removing a portion of the S1  lamina on the right, and then following along the gutter, following the  dura up using microdissection, with the operating microscope  draped and  brought in.  Disk at L5-S1 was identified.  The foramina were tight and  there were chunks of thick ligaments and recurrent growth off the  facets, and some extension at the top portion of L5, almost to the  midline.  Chunks of bone were removed with microdissection.  Chunks of  ligament and scar tissue were cut off the dura sharply, with care taken  to preserve the dura.  Continued dissection of the lateral gutter was  performed.  The nerve root was identified and the 4-5 disk showed a  very, very large rupture, which was scarred in with chunks of disk.  Microdissection was used to gently separate it, and gradually chunks of  the disk were removed.  Some of it was calcified and required the use of  the Kerrison to remove some portions of it.  I continued sweeping with  an Epstein until a hockey stick could be passed anterior to the dura  without compression.  On the opposite side, microdissection was  performed as well of chunks of disk, although there was more disk  present  on the right than the left.  There was minimal remaining disk  stuck to the midline just anterior to the dura and the foramen was  enlarged.  On the shoulder of the nerve root, there was a small defect  in the pia.  A single 6-0 Prolene was placed.  Prior to placing a single  suture, there was a check and there was no dural leakage.  A single  suture was checked again, and again there was no dural leak.  The nerve  root was completely free.  It was checked in the axilla anteriorly and  posteriorly and some additional spurs were removed.  Bone was removed  out to the level of the pedicle, and at the L4 level, there had been  recurrent bone causing stenosis dorsally to the dura, and this was  removed in chunks with microdissection, separating the dura out with the  dural separator and microdissection instruments with microcurets, and  then removing the bone with a 10-mm Kerrison.  With good decompression   on both sides at both levels, the foramen decompressed on the right at 5-  1, the operative field was irrigated.  The fascia was closed with 0  Vicryl deep, 2-0 Vicryl in the subcutaneous tissue, 4-0 Vicryl  subcuticular closure.  Tincture of benzoin and Steri-Strips.  Marcaine  infiltration.  Postoperative dressing and tape.  Instrument count and  needle count were correct.      Mark C. Ophelia Charter, M.D.  Electronically Signed     MCY/MEDQ  D:  03/12/2006  T:  03/13/2006  Job:  [id number]

## 2010-08-31 NOTE — Op Note (Signed)
[facility]  Patient:    Austin Rose                      MRN: [id number] Proc. Date: 04/20/99 Adm. Date:  [id number] Attending:  Charlett Lango CC:         Lennette Bihari, M.D.             Leroy Sea., M.D.                           Operative Report  PREOPERATIVE DIAGNOSIS:  Severe three-vessel coronary disease with unstable angina.  POSTOPERATIVE DIAGNOSIS:  Severe three-vessel coronary disease with unstable angina.  PROCEDURE:  Median sternotomy, coronary artery bypass grafting x 4 (left internal mammary artery to the left anterior descending artery, saphenous vein graft to he first obtuse marginal, saphenous vein graft to the posterior descending and posterolateral branches of the right coronary).  SURGEON:  Salvatore Decent. Dorris Fetch, M.D.  ASSISTANT:  Areta Haber, P.A.  ANESTHESIA:  General.  FINDINGS:  Severe diffuse, heavily calcified, heavily diseased coronary arteries with poor targets.  Fair quality vein grafts, good quality LIMA.  The patient is a poor candidate for redo surgery.  CLINICAL NOTE:  Mr. [name] is a 77 year old gentleman with a past history of myocardial infarction and stable angina.  He now presents with unstable angina nd underwent cardiac catheterization.  Cardiac catheterization revealed severe three vessel disease with heavily diseased, calcified and diffusely diseased coronary  arteries.  Overall left ventricular function was well preserved.  The patient was referred for coronary artery bypass grafting.  The indications, risks and benefits of the procedure were discussed in detail with the patient.  He understood that  because of his heavily calcified vessels than incomplete revascularization was ore likely than the average case, as was the possibility of continued cardiac symptoms postoperatively.  The patient understood these risks, benefits and alternatives and agreed to  proceed with coronary artery bypass grafting.  DESCRIPTION OF PROCEDURE:  Mr. [name] was brought to the preoperative holding area on April 20, 1999.  Lines were placed to monitor arterial, central venous, and  pulmonary arterial pressure.  EKG leads were placed for continuous telemetry.  The patient was taken to the operating room, anesthetized and intubated.  A Foley catheter was placed and intravenous antibiotics were administered. The chest, abdomen and legs were prepped and draped in the usual fashion.  A median sternotomy was performed and the left internal mammary artery was harvested in the standard fashion.  The patient was fully heparinized prior to dividing the distal end of the mammary vessel.  Simultaneously, incision was made in the medial aspect of the left leg and the greater saphenous vein was harvested from the ankle to the thigh.  Saphenous vein was of fair quality.  It was relatively small in size.  There was no varicosities or sclerotic areas.  There was good flow through the cut end of the mammary artery when the distal end was divided.  The mammary was placed in a prepared ___ sponge and placed into the left pleural space.  The pericardium was opened, the ascending aorta was palpated. There was no palpable atherosclerotic disease.  The aorta was cannulated via concentric tool, Ethibond pledgets and pursestring sutures.  The dual stage of this cannula was placed via pursestring suture in the right atrial appendage.  Cardiopulmonary bypass was instituted and the patient  was cooled to 32 degrees Celsius.  The coronary arteries were inspected and anastomotic sites chosen.  The conduits were inspected and cut to length.  A foam pad was placed in the pericardium.  A temperature probe was placed in the myocardial septum and a cardioplegia cannula was placed in the ascending aorta.  The aorta was cross-clamped.  The left ventricle was entered via the  left aortic root. Cardiac arrest was then achieved with combination of cold antegrade blood  cardioplegia and topical iced saline.  After achieving complete diastolic arrest and myocardial septal temperature of 10 degrees Celsius the following the distal anastomoses were performed.  First, a reverse saphenous vein graft was placed sequentially to the posterior descending and posterolateral branches of the right coronary system.  The posterior descending was a small fair quality vessel, 1.3 mm in diameter approximately. There was diffuse atherosclerotic disease, although, there was no disease at the site of the anastomosis.  This anastomosis was performed side-to-side off a branch of the vein graft and the anastomosis was probed proximally and distally before  tying the suture and there was good flow with flushing of the graft with ice cold heparinized saline.  Next and end-to-side anastomosis was constructed using the  same segment of saphenous vein to the posterolateral branch of the right coronary. It was a 1.5 mm fair vessel, again, with diffuse atherosclerotic plaques, although, probe did pass to the distal end of the vessel.  The anastomosis was performed ith a running 7-0 Prolene suture in an end-to-side fashion, again, was probed proximally and distally to ensure patency before tying the suture.  There was good flow through the graft with flushing.  Cardioplegia was administered down the vein graft.  Next, a reverse saphenous vein graft was placed end-to-side to the first obtuse  marginal branch of the left circumflex coronary.  This was a large branch.  The  lumen was only 1.5 mm in diameter.  It was poor quality vessel.  It was intramyocardial at the site of the anastomosis which was relatively distal on the vessel because of diffuse disease.  The vein graft was of fair quality.  The anastomosis was performed end-to-side with a running 7-0 Prolene  suture. Again, there was good flow with flushing of the graft and additional cardioplegia was administered down both vein grafts.  Next, the left internal mammary artery was brought through a window in the  pericardium anterior to the left phrenic nerve.  The distal end was spatulated nd anastomosed end-to-side to the distal LAD.  The distal LAD was a heavily diseased calcified vessel throughout its course.  It was totally occluded at the apex. he site of the anastomosis had moderate atherosclerotic disease and only a 1 mm probe would pass proximally and distally.  The anastomosis was performed end-to-side ith a running 8-0 Prolene suture.  At the completion of the anastomosis, bulldog clamps were removed from the mammary artery. Immediate and rapid septal rewarming was noted.  The aortic cross-clamp was removed.  Total cross-clamp time was 64 minutes.  The vein grafts were cut to length.  A partial occlusion clamp was placed on the ascending aorta. The cardioplegia cannula was removed.  The proximal vein graft  anastomoses were performed to 4.0 mm punch aortotomies with running 6-0 Prolene  sutures placed in the final proximal anastomosis.  The patient was placed in the Trendelenburg position, the clamp was slowly removed before tying final suture o the air could escape as the aorta filled  with blood.  The anastomotic suture then was tied, air was aspirated from the vein grafts, bulldog clamps were removed and flow was restored.  All proximal and distal anastomoses were inspected for hemostasis. The patient was rewarmed.  Epicardial pacing wires were placed on the right ventricle and right atrium.  The patient was weaned from cardiopulmonary bypass.  When the core temperature reached 37 degrees Celsius, the patient weaned from bypass with inotropic support.  Total bypass time was 120 minutes.  A test dose of protamine was administered and was well tolerated.  The  atrial and aortic cannula were removed.  The remainder of the protamine was administered without incident. An additional dose of antibiotics was given.  The chest irrigated with 1L of warm normal saline containing 1 g of Vancomycin.  Hemostasis was achieved.  The mediastinal fat was reapproximated over the aorta and base of the heart with interrupted 3-0 silk sutures.  The left pleural and two mediastinal chest tubes were placed through separate subcostal incisions and secured with #1 silk sutures.  The sternum was closed with stainless wires. Pectoralis fascia was closed with a running #1 Vicryl suture.  The subcutaneous tissue was closed with a running 2-0 Vicryl suture and the skin was closed with a 3-0 Vicryl subcuticular suture.  The leg incision was closed in two layers with a running 2-0 Vicryl subcutaneous suture, the skin was closed with staples.  All sponge, needle and instrument counts were correct at the end of the procedure. The patient remained hemodynamically stable throughout the post bypass course and was taken from the operating room to the surgical intensive care unit intubated and in stable condition. DD:  04/20/99 TD:  04/22/99 Job: [id number]

## 2010-08-31 NOTE — Discharge Summary (Signed)
. [facility]  Patient:    Austin Rose, Austin Rose                       MRN: [id number] Adm. Date:  04/17/99 Disc. Date: 04/26/99 Attending:  Salvatore Decent. Dorris Fetch, M.D. Dictator:   Maple Mirza, P.A. CC:         Lennette Bihari, M.D.             Leroy Sea., M.D.             Salvatore Decent Dorris Fetch, M.D.                           Discharge Summary  DATE OF BIRTH: Jan 04, 1926  FINAL DIAGNOSES: 1. Unstable angina. 2. Atherosclerotic coronary artery disease. 3. Hypertensive, asymptomatic, postoperatively. 4. Otitis media,  postoperatively placed on oral Cipro for an eight-day course. 5. Hyperlipidemia on admission laboratories.  SECONDARY DIAGNOSES: 1. History of cerebral small-vessel diseased. 2. History of tobacco habituation, having quit several years ago.  PROCEDURES: 1. April 18, 1999, left heart catheterization, left ventriculogram, and coronary    angiography, Dr. Nicki Guadalajara. This study demonstrated severe three-vessel    atherosclerotic coronary artery disease.  The left main had a 40% ostial    stenosis.  The left anterior descending had a 70% ostial stenosis and a 70%    stenosis between the first and second diagonals.  The apical left anterior    descending coronary artery was totally occluded.  The first diagonal had a 90%    proximal stenosis.  The second diagonal had a 95% proximal stenosis.  The left    circumflex had an 80% proximal stenosis.  The A-V groove was totally occluded    after the marginal.  The marginal had a diffuse 90 to 95% proximal stenosis.  The right coronary artery had a 90% proximal stenosis, a 70 to 80% midpoint    stenosis before the posterior descending coronary artery.  The posterior    descending coronary artery had an 80% ostial, 70% distal right coronary artery    inferior left ventricular branch. 2. April 20, 1999, coronary artery bypass graft surgery x 4, Dr. Charlett Lango,  surgeon.  In this procedure, the left internal mammary artery was    connected in an end-to-side fashion to the left anterior descending    coronary artery.  A reverse saphenous vein graft was fashioned from the aorta    to the first obtuse marginal, and the sequential reverse saphenous vein graft    was fashioned from the aorta to the OM and then to the posterior descending    coronary artery.  DISCHARGE DISPOSITION:  Austin Rose was judged a suitable candidate for discharge on postoperative day #6 after undergoing coronary artery bypass graft  surgery.  He has experienced no cardiac dysrhythmias.  He was relieved of all supplemental oxygen by postoperative day #3.  He is ambulating independently. is left thigh incision is draining copious serous fluid.  There is no evidence of erythema or swelling.  The sternotomy incision is healing well.  His mental status has been clear in the postoperative period.  He is taking oral nourishment and tolerating it well.  He has full GI tract function.  His pain is well controlled with oral analgesia.  DISCHARGE MEDICATIONS:  He goes home on the following medications. 1. Percocet 5/325 one to two  tablets p.o. q.4-6h. p.r.n. pain. 2. Tenormin 25 mg daily. 3. Cipro 500 mg b.i.d. for a 4-day course. 4. Niferex 150 mg b.i.d. with food. 5. Zocor 20 mg at bedtime. 6. Enteric-coated aspirin 325 mg daily.  DISCHARGE DIET:  Low-sodium, low-cholesterol diet.  DISCHARGE ACTIVITY:  Ambulation as tolerated.  He is not to lift any weight greater than 10 pounds nor to drive for the next six weeks.  WOUND CARE:  He may shower daily, keeping his incisions clean and dry.  He will  have home health to provide daily wound care at home, checking the wound and dressing it daily.  FOLLOWUP:  He also will present to the offices of [facility] Wednesday, May 02, 1999, for staple removal.  The office will  call  him with a time for that appointment, and at that time, he will have an appointment to see Dr. Dorris Fetch three weeks after discharge.  He is also asked to call Dr. Tresa Endo for a two-week followup.  Chest x-ray will be taken by his cardiologist, Dr. Tresa Endo, at that time, and he will bring the chest x-ray to his  appointment with Dr. Charlett Lango.  BRIEF HISTORY:  This is a 77 year old male with a history of angina remotely but no previous cardiac workup.  The patient was painting with oil paint on the morning of January 2 when he had sudden onset of substernal chest pain with radiation to the left elbow.  He went outside, and the chest pain did eventually resolve, but the elbow pain persisted.  He had no concurrent shortness of breath, nausea, vomiting, diaphoresis, palpitations, syncope.  In the emergency room at [facility] where he presented April 17, 1999, the elbow pain had resolved.  He was seen there and ruled out for a myocardial infarction; serial cardiac enzymes were negative.  He was placed on IV heparin and a nitroglycerin drip.  He was given aspirin, Plavix, and started on Lopressor 25 mg p.o. b.i.d.   His lipid status as also checked.  He was scheduled for left heart catheterization.  The left heart catheterization was done April 18, 1999.  This study showed severe three-vessel atherosclerotic coronary artery disease as dictated above.  In addition, he underwent carotid duplex ultrasound which showed no evidence of significant internal carotid artery stenoses.  Ankle-brachial indexes, however, were somewhat revealing.  The ankle-brachial index on the right was 0.54, on the left was 1.0.  The surgeons of the [facility] were consulted.  Dr. Charlett Lango saw Austin Rose, reviewed the angiography, and recommended coronary artery bypass graft surgery. He described the risks and benefits of this procedure,  and Austin Rose elected to undergo the procedure.  This was done on April 20, 1999.  Four bypasses were placed as previously described.  Austin Rose tolerated the procedure well and was transferred in a stable satisfactory condition to the intensive care unit.  On the day of surgery, his cardiac index was 2.99, hematocrit 29%.  He was extubated on the day of surgery.  On postoperative day #1, his cardiac index remained good at 2.35, hematocrit 24. He was given albumin for continued hypotension.  He was started on a renal dose of dopamine to initiate gentle diuresis.  On postoperative day #2, his hematocrit was 22.5%, creatinine 1.3.  He was achieving 97% oxygen saturation on 2 liters of nasal cannula.  On postoperative day #3, it was decided that the renal dose dopamine could  be weaned.  His blood pressure was 120/60.  He was achieving 97% oxygen saturation on room air.  He was still, however, 11 pounds of fluid positive.  By postoperative day #4, his weight had decreased by 4 pounds to 202 pounds. His preoperative weight was 195 pounds.  Hemoglobin was 8.3.  He was achieving 96% oxygen saturation on room air.  He was complaining of pressure in the ear, and otoscopic examination showed evidence of fluid behind the tympanum.  He was started on Cipro for this and continued on this as an outpatient.  On postoperative day #5, hemoglobin was stable at 8.3.  He was ambulating independently.  The left thigh continued with serous drainage, and dressings were changed several times daily.  He remained hypotensive, and his Tenormin dose was held.  By postoperative day #6, he was taken off Altace, Lasix, and potassium.  His blood pressure did improve.  He was given his daily dose of Tenormin.  Home physical therapy and wound care was arranged for Austin Rose, and he was judged suitable or discharge on postoperative day #6 with the medications and followup as dictated  above.DD:   04/26/99 TD:  04/26/99 Job: [id number]

## 2010-08-31 NOTE — Cardiovascular Report (Signed)
[place]. [facility]  Patient:    Austin Rose                      MRN: [id number] Proc. Date: 04/18/99 Adm. Date:  [id number] Attending:  Armanda Magic CC:         Leroy Sea., M.D.             Lennette Bihari, M.D.             Armanda Magic, M.D.                        Cardiac Catheterization  INDICATIONS:  Mr. [name] is a 77 year old white male patient of Dr. Cecille Rubin.  The patient was admitted to [facility] yesterday, April 17, 1999, with recurrent episodes of left arm and chest discomfort suggesting unstable angina pectoris.  Initial CPK enzymes were negative.  The patient was treated with IV heparin, nitroglycerin, and started on aspirin and plavix and is stable for cardiac catheterization.  HEMODYNAMIC DATA:             Central aortic pressure was 115/67.                               Left ventricular pressure 115/24.  ANGIOGRAPHIC DATA:  There was significant coronary calcification involving the eft main LAD circumflex and right coronary arteries.  The left main coronary artery had 40% ostial tapering and bifurcated into an LAD and left circumflex system.  The LAD had 70% ostial stenosis and that had diffuse 70% stenosis between the first and second diagonal vessel followed by a 90% stenosis after the takeoff of the second diagonal vessel.  The apical LAD was totally occluded.  The first diagonal vessel was diffuse with disease and had 90% proximal stenosis and then had a long 95% stenosis of a small inferior branch of this first diagonal vessel.  The second diagonal vessel had 95% stenosis.  The circumflex vessel had 80% proximal stenosis and gave rise to a major obtuse  marginal vessel.  The AV groove circumflex was totally occluded after the marginal takeoff.  The marginal vessel had diffuse 90-95% stenosis proximally and then had 40% distal stenosis.  The right coronary artery was diffusely  diseased and had 90% proximal stenosis followed by segmental tandem 70-80% mid stenosis, 80% stenosis of each of the crux, 90% stenosis before the PDA takeoff, 80% ostial PDA narrowing and 70% stenosis f the distal RCA inferior LV branch.  There was collaterals to the AV groove circumflex from the distal right coronary artery.  The left internal mammary artery was suitable for CVG surgery although there was mild 20% narrowing proximal to the LIMA takeoff in the subclavian vessel just proximal to the vertebral artery takeoff which had mild narrowing of 20%.  Distal aortography did not demonstrate any significant aortoiliac disease although there was mild vessel tortuosity.  Biplanes and left tomography revealed mild apical hypocontractility and also mild hypocontractility in the distal anterolateral wall with otherwise preserved global LV function.  IMPRESSION: 1. Mild apical and anterolateral hypocontractility. 2. Severe multivessel native coronary obstructive disease with dense coronary    calcification, 40% ostial left main stenosis, diffuse 70% ostial, 70% proximal,    90% mid and total occlusion of the apical LAD with 90% first diagonal and 95%    second diagonal stenosis;  proximal 80% circumflex stenosis with diffuse 90-95%    circumflex marginal stenosis, 40% distal marginal stenosis, and total occlusion    of the AV groove circumflex; and diffuse 90%, 70%, 80% and 90% proximal mid    distal RCA stenosis with 80% PDA, 70% infralateral branch stenosis.  RECOMMENDATION:  CVG revascularization surgery. DD:  04/18/99 TD:  04/18/99 Job: [id number]

## 2010-10-18 ENCOUNTER — Other Ambulatory Visit: Payer: Self-pay | Admitting: Family Medicine

## 2010-10-19 ENCOUNTER — Other Ambulatory Visit: Payer: Self-pay

## 2010-10-19 MED ORDER — TEMAZEPAM 15 MG PO CAPS: 15.0000 mg | ORAL_CAPSULE | Freq: Every evening | ORAL | Status: DC | PRN

## 2010-10-19 NOTE — Telephone Encounter (Signed)
rx has been faxed to pharmacy

## 2011-02-25 ENCOUNTER — Other Ambulatory Visit: Payer: Self-pay | Admitting: Family Medicine

## 2011-02-25 NOTE — Telephone Encounter (Signed)
Pt last seen 08/16/10. Rx last filled on 08/21/10 #120 with 5 rf.  Pt has an appt with Dr. Fabian Sharp on 06/12/11.Pls advise.

## 2011-02-26 ENCOUNTER — Telehealth: Payer: Self-pay | Admitting: Internal Medicine

## 2011-02-26 NOTE — Telephone Encounter (Signed)
Pt is aware can take up to 72 hours for med refills

## 2011-03-01 MED ORDER — HYDROCODONE-ACETAMINOPHEN 10-650 MG PO TABS
1.0000 | ORAL_TABLET | ORAL | Status: DC | PRN
Start: 2011-03-01 — End: 2011-04-10

## 2011-03-01 NOTE — Telephone Encounter (Signed)
Per Dr. Fabian Sharp- ask pt why he is taking this medication. How often he uses this and how long. Any side effects. She usually doesn't prescribe daily long term narcotics. However can refill one time and need needs ov to discuss. He may need to see a specialist.   Rx called in.  Spoke to pt's wife- he has been taking this for arthritis and has been taking it for a long time. He takes it at least once a day. Wife states that she thinks that he may have come dependant on this. Pt has an appt on 06/12/11 and she is aware that we will discuss going to a specialist.

## 2011-03-01 NOTE — Telephone Encounter (Signed)
Pt is out of hydrocodone please call [facility] [phone]

## 2011-04-10 ENCOUNTER — Ambulatory Visit (INDEPENDENT_AMBULATORY_CARE_PROVIDER_SITE_OTHER): Payer: Medicare PPO | Admitting: Internal Medicine

## 2011-04-10 ENCOUNTER — Encounter: Payer: Self-pay | Admitting: Internal Medicine

## 2011-04-10 VITALS — BP 110/58 | HR 58 | Temp 98.4°F | Wt 186.0 lb

## 2011-04-10 DIAGNOSIS — G609 Hereditary and idiopathic neuropathy, unspecified: Secondary | ICD-10-CM

## 2011-04-10 DIAGNOSIS — J988 Other specified respiratory disorders: Secondary | ICD-10-CM

## 2011-04-10 DIAGNOSIS — J22 Unspecified acute lower respiratory infection: Secondary | ICD-10-CM

## 2011-04-10 DIAGNOSIS — R05 Cough: Secondary | ICD-10-CM

## 2011-04-10 DIAGNOSIS — Z79899 Other long term (current) drug therapy: Secondary | ICD-10-CM

## 2011-04-10 DIAGNOSIS — R059 Cough, unspecified: Secondary | ICD-10-CM

## 2011-04-10 MED ORDER — HYDROCODONE-HOMATROPINE 5-1.5 MG/5ML PO SYRP: 5.0000 mL | ORAL_SOLUTION | ORAL | Status: DC | PRN

## 2011-04-10 MED ORDER — HYDROCODONE-ACETAMINOPHEN 10-650 MG PO TABS: 1.0000 | ORAL_TABLET | ORAL | Status: DC | PRN

## 2011-04-10 NOTE — Patient Instructions (Signed)
This acts like a viral  Respiratory infection.  That should resolved in  7-10 days or so .No current signs of pneumonia.  Call if fever increasing shortness of breath or other concerns .  Cough med ok to try for comfort but can cause drowsiness also. And just use at night .

## 2011-04-10 NOTE — Progress Notes (Signed)
  Subjective:    Patient ID: Austin Rose, male    DOB: 1925-08-25, 77 y.o.   MRN: [id number]  HPI Patient comes in today for SDA  For acute problem evaluation. With wife  Onset for a few days and coughing and worse this am .  Unsure  What this is   Wife worried about  Getting pna.   Seems like a bad cold.  No ent sx  breathing no change in  Baseline   Doe.   Past  Year has had a number of issues vertigo and divertic  And  Had rehab Now    [facility]  With a cane for balance .  Cataract  surgery ;vision good.  No copd dx  Asthma   Ex smoker.   Review of Systems No fever cp sob hemoptysis   Had tie pain at night especially  neuropathy and take narcotics for a while 3-4 x per day for this.   ? Se of some meds   Per dr Scotty Court  Uses sleep meds   Past history family history social history reviewed in the electronic medical record.     Objective:   Physical Exam  WDWN innad  Walks with a cane able to get on table with some problem Looks well  HEENT: Normocephalic ;atraumatic , Eyes;  PERRL, EOMs  Full, lids and conjunctiva clear,,Ears: no deformities, canals nl, TM landmarks normal, Nose: no deformity or discharge mild congestion Mouth : OP clear without lesion or edema . Neck: Supple without adenopathy or masses or bruits Chest:  Clear to A&P without wheezes rales or rhonchi CV:  S1-S2 no gallops or murmurs peripheral perfusion is normal Neuro oreinted non focal gait antalgic and slow but independent     Assessment & Plan:  Cough prob viral rti  No alarm features but is at risk . Close follow up no pna signs seen today  Peripheral neuropathy toe pain  rx with narcotics for help  ? Cause   Apparent hx of of other meds with se but not in the epic  Notes     Risk benefit of medication discussed. And usually more risk than benefit however has been doing this for a while .  Ok to refill for now  Consider other consultation  Perhaps neuro  At his establishment appt.   Reviewed all  meds that can cause sedation and risk for falling   Total visit > 50% spent counseling and coordinating care

## 2011-04-10 NOTE — Assessment & Plan Note (Signed)
Risk benefit of medication discussed. Refill until next appt  rec decrease pill count  .

## 2011-04-29 ENCOUNTER — Other Ambulatory Visit: Payer: Self-pay | Admitting: Family Medicine

## 2011-05-08 ENCOUNTER — Other Ambulatory Visit: Payer: Self-pay | Admitting: Internal Medicine

## 2011-05-09 ENCOUNTER — Other Ambulatory Visit: Payer: Self-pay | Admitting: Family Medicine

## 2011-05-10 ENCOUNTER — Other Ambulatory Visit: Payer: Self-pay | Admitting: Internal Medicine

## 2011-05-13 MED ORDER — MECLIZINE HCL 32 MG PO TABS: 25.0000 mg | ORAL_TABLET | Freq: Three times a day (TID) | ORAL | Status: DC | PRN

## 2011-05-13 NOTE — Telephone Encounter (Signed)
WIll rx for 60 of the lortab only  And refill the meclizine x 1  Each of these meds can cause dizziness falling and  Mental fogginess. We should work on decreasing use of these medications

## 2011-05-13 NOTE — Telephone Encounter (Signed)
Disp 30  he should not take temazepam with hydrocodone and meclizine to avoid  Memory disruption and dizziness.

## 2011-05-13 NOTE — Telephone Encounter (Signed)
Pt also need meclizine for dizziness call [facility] [phone]

## 2011-05-13 NOTE — Telephone Encounter (Signed)
Pt is out °

## 2011-05-13 NOTE — Telephone Encounter (Signed)
Meclizine sent in electronically. Lortab called in.

## 2011-05-14 NOTE — Telephone Encounter (Signed)
Called in.

## 2011-05-22 NOTE — Telephone Encounter (Signed)
Already taken care of

## 2011-06-06 ENCOUNTER — Other Ambulatory Visit: Payer: Self-pay | Admitting: Internal Medicine

## 2011-06-07 NOTE — Telephone Encounter (Signed)
Rx called in 

## 2011-06-07 NOTE — Telephone Encounter (Signed)
Ok to refill 60 x 1 . He has appt next week.

## 2011-06-12 ENCOUNTER — Ambulatory Visit (INDEPENDENT_AMBULATORY_CARE_PROVIDER_SITE_OTHER): Payer: Medicare PPO | Admitting: Internal Medicine

## 2011-06-12 ENCOUNTER — Encounter: Payer: Self-pay | Admitting: Internal Medicine

## 2011-06-12 VITALS — BP 140/90 | HR 69 | Ht 67.5 in | Wt 188.0 lb

## 2011-06-12 DIAGNOSIS — I10 Essential (primary) hypertension: Secondary | ICD-10-CM

## 2011-06-12 DIAGNOSIS — Z79899 Other long term (current) drug therapy: Secondary | ICD-10-CM

## 2011-06-12 DIAGNOSIS — R42 Dizziness and giddiness: Secondary | ICD-10-CM

## 2011-06-12 DIAGNOSIS — G609 Hereditary and idiopathic neuropathy, unspecified: Secondary | ICD-10-CM

## 2011-06-12 DIAGNOSIS — E039 Hypothyroidism, unspecified: Secondary | ICD-10-CM

## 2011-06-12 DIAGNOSIS — I2510 Atherosclerotic heart disease of native coronary artery without angina pectoris: Secondary | ICD-10-CM

## 2011-06-12 DIAGNOSIS — E785 Hyperlipidemia, unspecified: Secondary | ICD-10-CM

## 2011-06-12 LAB — CBC WITH DIFFERENTIAL/PLATELET
Eosinophils Absolute: 0.2 10*3/uL (ref 0.0–0.7)
Lymphocytes Relative: 23.1 % (ref 12.0–46.0)
MCHC: 33.3 g/dL (ref 30.0–36.0)
MCV: 90.8 fl (ref 78.0–100.0)
Monocytes Absolute: 0.4 10*3/uL (ref 0.1–1.0)
Neutrophils Relative %: 66.4 % (ref 43.0–77.0)
Platelets: 177 10*3/uL (ref 150.0–400.0)
RBC: 4.67 Mil/uL (ref 4.22–5.81)
WBC: 6.2 10*3/uL (ref 4.5–10.5)

## 2011-06-12 LAB — BASIC METABOLIC PANEL
BUN: 13 mg/dL (ref 6–23)
CO2: 25 mEq/L (ref 19–32)
Calcium: 9.5 mg/dL (ref 8.4–10.5)
Chloride: 109 mEq/L (ref 96–112)
Creatinine, Ser: 1.2 mg/dL (ref 0.4–1.5)

## 2011-06-12 LAB — HEPATIC FUNCTION PANEL
Alkaline Phosphatase: 60 U/L (ref 39–117)
Bilirubin, Direct: 0.1 mg/dL (ref 0.0–0.3)
Total Bilirubin: 0.6 mg/dL (ref 0.3–1.2)

## 2011-06-12 LAB — VITAMIN B12: Vitamin B-12: 1366 pg/mL — ABNORMAL HIGH (ref 211–911)

## 2011-06-12 MED ORDER — MECLIZINE HCL 25 MG PO TABS: 12.5000 mg | ORAL_TABLET | Freq: Three times a day (TID) | ORAL | Status: AC | PRN

## 2011-06-12 NOTE — Progress Notes (Signed)
Subjective:    Patient ID: Austin Rose, male    DOB: 02/25/1926, 78 y.o.   MRN: [id number]  HPI Patient comes in today transfer patient from Dr. Scotty Court. He also is being seen at the [place] to get his medications without charge. [facility] except for the pain medication meclizine and temazepam.  His ongoing medical problems include: Peripheral neuropathy Feet burning and hurting at times and took pain pill.  And this am and still butns.  For 8- 9 years. He is using hydrocodone twice a day for this pain. Last fall  18 month ago none recently uses a cane pretty well Recurrent dizziness; he is apparently been on meclizine as needed but is pretty much taking it daily down to 1 or 2 a day it says he helps them he denies dizziness from the pain medicine her other medicines  Sleep: He is being given Restoril at times for sleep only takes it occasionally not every night.  Coronary artery disease:  Under specialty care but medications are stable and has no active symptoms at this time on medications and lipid medicines Hypertension:  Controlled Degenerative joint disease history of back surgery has served him well. Review of Systems No chest pain shortness of breath his vision is decreased has had cataract surgery hearing is adequate. No unusual bruising or bleeding recent fractures or falls he gets orthostatic dizziness when he stands up he gets lightheaded and takes a while to get going. He does not think it's from his medicine but it's been there for a while and deals with it. Rest of ROS as per history of present illness He denies specific claudication or pain in his legs when elevated Past history family history social history reviewed  And updated  As best as possible in the electronic medical record.     Objective:   Physical Exam Well-developed well-nourished elderly alert verbal gentleman in no acute distress he rises from the chair  fairly slowly but maneuvers walking with his cane pretty well. There is no foot drop. HEENT grossly nl has no teeth has dentures. Neck: Supple without adenopathy or masses or bruits Chest:  Clear to A&P without wheezes rales or rhonchi CV:  S1-S2 no gallops or murmurs peripheral perfusion is normal but his toes are somewhat cool pulses in his feet are slightly decreased +1 DP No clubbing cyanosis or edema Feet showed decreased sensation around the toes and thickened toenails some shininess no ulcers or calluses. Slight swelling of his left ankle. No redness or warmth       Assessment & Plan:   Peripheral neuropathy  problematic by history doesn't appear to be progressing but is most problematic causing pain for which he takes daily opiates with some help. Discussed potential side effects of the medicine but he feels that he's been pretty stable and is careful Dr. Scotty Court had discussed Lyrica at some point whether the [facility] to prescribe this or not. It is unclear whether he's been tried on this. Samples given of 50 mg Lyrica to take one twice a day with care side effects explained to not take for sleep medicine with this or the same time as the Antivert. This may help his pain more than a narcotic. Caution with sedation at  Initiation.  At this time get labs  And fu depending on results      COnsider ABI   if not done .   His pulse seem decrease  but present although no claudication hx  reported  Care and med management at 2 different facilities is problematic for best care and would prefer the [facility]  Including pain management However this  Will probably not occur at this point.   He is doing this for monetary reasons.   Degenerative joint disease  history of spine surgery stable  Chronic opioid use  Apparently tolerated despite the risk .   High risk med use  antivert and  Temazepam .  Would advise  He stop the temazepam  . Limit the antivert. Curiously the antivert dose was  place as  32 mg in the ehr  which I think is incorrect  But difficult  to track .    Hypertension: stable  Coronary artery disease: stable  Dizziness grabbed in the past as labyrinthitis has been on meclizine but premeds taking it every day discussed that he may want to decrease that as this can cause foggy headedness. Also reports orthostatic dizziness  CHew  Tobacco  Denies  Mouth disease  Not that interested in stopping at his age he says.   Total visit > 50% spent counseling and coordinating care

## 2011-06-12 NOTE — Patient Instructions (Addendum)
You are on 3 medications that cause drowsiness and mental fogginess; pain pill ;the sleeping pill and the meclizine for the dizziness. I would have you try to not take the sleeping pill use pain medicine if needed. Only used the dizzy medicine as needed.  You may benefit from medication just for neuropathy pain one of these medicines his Lyrica.  It could also cause drowsiness but that goes away with time but may help your pain it is a branded medicine would ask the [facility] to prescribe this and see if this helps you.  You can try Lyrica 50 mg 1 twice a day  For  the neuropathy pain.   and can be increased slowly as directed   In the meantime continue using her cane fall prevention we'll get labs today   Impression followup have the [facility] send a copy of any laboratory evaluations that they do. Also have you cardiologist send [place] any evaluations that are done.

## 2011-06-16 ENCOUNTER — Encounter: Payer: Self-pay | Admitting: Internal Medicine

## 2011-06-16 NOTE — Assessment & Plan Note (Signed)
Disc caution and try to wean and limit these   .  Pain med use seems to be stable

## 2011-06-17 ENCOUNTER — Encounter: Payer: Self-pay | Admitting: *Deleted

## 2011-06-17 NOTE — Progress Notes (Signed)
Quick Note:    Letter sent to pt.  ______

## 2011-06-20 ENCOUNTER — Other Ambulatory Visit: Payer: Self-pay | Admitting: Internal Medicine

## 2011-06-20 NOTE — Telephone Encounter (Signed)
Last seen 06/12/11 Last written 05/09/11 # 30  0Rf Please advise

## 2011-06-21 NOTE — Telephone Encounter (Signed)
I advise he get off of this medication   For the reasons we discussed before.  Can give him 15 pills in the meantime to use only  If needed until he can stop the medicine.  This medicine can be risky to  his health.

## 2011-06-30 ENCOUNTER — Telehealth: Payer: Self-pay | Admitting: Internal Medicine

## 2011-07-02 NOTE — Telephone Encounter (Signed)
Rx called in. Pt aware that rx called. Lyrica is not helping. Pt to ask if [facility] will do pain medication.

## 2011-07-02 NOTE — Telephone Encounter (Signed)
Pt is out of hydrocodone

## 2011-07-02 NOTE — Telephone Encounter (Signed)
How is the lyrica doing .   Did he try it.  The next time he goes should ask if they can do his pain medication also.  Refill 60  X 2

## 2011-09-11 ENCOUNTER — Ambulatory Visit: Payer: Medicare PPO | Admitting: Internal Medicine

## 2011-09-20 ENCOUNTER — Telehealth: Payer: Self-pay

## 2011-09-20 NOTE — Telephone Encounter (Signed)
Rx request for hydrocodone-acetaminophen 10-650.  Rx last filled 06/30/11 #60 x 2 rf.  Pt last seen on 06/12/11.  Pls advise.

## 2011-09-23 MED ORDER — HYDROCODONE-ACETAMINOPHEN 10-650 MG PO TABS: 1.0000 | ORAL_TABLET | Freq: Four times a day (QID) | ORAL | Status: DC

## 2011-09-23 NOTE — Telephone Encounter (Signed)
Ok to refill x 2   he is due for an ROV to be completed before further refills.

## 2011-09-23 NOTE — Telephone Encounter (Signed)
Rx called in to [facility]. Called to make pt aware that per Dr. Fabian Sharp an rov is needed before further refills. Left a message with pt's spouse to return call.

## 2011-11-05 ENCOUNTER — Encounter: Payer: Self-pay | Admitting: Internal Medicine

## 2011-11-05 ENCOUNTER — Ambulatory Visit (INDEPENDENT_AMBULATORY_CARE_PROVIDER_SITE_OTHER): Payer: Medicare PPO | Admitting: Internal Medicine

## 2011-11-05 VITALS — BP 100/64 | HR 54 | Temp 98.3°F | Wt 185.0 lb

## 2011-11-05 DIAGNOSIS — M129 Arthropathy, unspecified: Secondary | ICD-10-CM

## 2011-11-05 DIAGNOSIS — I951 Orthostatic hypotension: Secondary | ICD-10-CM

## 2011-11-05 DIAGNOSIS — I2510 Atherosclerotic heart disease of native coronary artery without angina pectoris: Secondary | ICD-10-CM

## 2011-11-05 DIAGNOSIS — Z79899 Other long term (current) drug therapy: Secondary | ICD-10-CM

## 2011-11-05 DIAGNOSIS — G609 Hereditary and idiopathic neuropathy, unspecified: Secondary | ICD-10-CM

## 2011-11-05 DIAGNOSIS — R42 Dizziness and giddiness: Secondary | ICD-10-CM

## 2011-11-05 DIAGNOSIS — G479 Sleep disorder, unspecified: Secondary | ICD-10-CM

## 2011-11-05 DIAGNOSIS — E785 Hyperlipidemia, unspecified: Secondary | ICD-10-CM

## 2011-11-05 MED ORDER — MECLIZINE HCL 25 MG PO TABS: 25.0000 mg | ORAL_TABLET | Freq: Three times a day (TID) | ORAL | Status: AC | PRN

## 2011-11-05 MED ORDER — HYDROCODONE-ACETAMINOPHEN 10-650 MG PO TABS: 1.0000 | ORAL_TABLET | Freq: Four times a day (QID) | ORAL | Status: DC | PRN

## 2011-11-05 NOTE — Patient Instructions (Addendum)
I agree with stopping for temazepam as I recommended I nose asleep can be restless but I think the medicine has more risk than benefit to you.  I believe that the medication that you were using for dizziness as needed with meclizine; this can also make you drowsy but can be used as needed. It  is not usually recommended in your age group because she's to have a risk of falling and fogginess although it can help the feeling of dizziness. We used this for seasickness. I would have you use this only if absolutely necessary because of these reasons.  You can continue the pain medication as you were doing but if having to use much more than average you need to contact us.  Continued to have the [facility] get [place] the information of what they do in her lab work.  We'll have you return  in 6 months or as needed contact us for refills in the meantime.

## 2011-11-05 NOTE — Progress Notes (Signed)
Subjective:    Patient ID: Austin Rose, male    DOB: 1926-03-19, 78 y.o.   MRN: [id number]  HPI Patient comes in today for follow up of  multiple medical problems.  No major change in health status since last visit . He was seen at the [place] where he gets his meds"free" .  Labs done cbc  And lipid at goal. Pain about the same and feels controlled with bid dosing with ocass extra  so he can function and garden. NO falls . Helps burning feet and restless feeling at night.  Given sample of lyrica last visit and didn't work to help him. Has stopped the temazepam as I suggested and has some sleep issues . Still gets ocass vertigo and asks for the dizzy med refill . If needed.   Has had a shot in left knee and helps walkin and left leg pain.  Feels prestty good otherwise  LIPIDS; no se of meds CAD no new sx .  Sees cards routinely  On hydrofluorocortisone bid for dizziness but unsure who is treating him  For this. ? Cards.   Review of Systems  No cp sob bleeding vision good after cataract surgery but feels better with glasses.  No claudication otherwise.  Cough edema change ulcers  On feet. No hearing change  Outpatient Encounter Prescriptions as of 11/05/2011  Medication Sig Dispense Refill  . b complex vitamins capsule Take 1 capsule by mouth 2 (two) times daily.        . ferrous sulfate 325 (65 FE) MG tablet Take 325 mg by mouth daily with breakfast.        . HYDROcodone-acetaminophen (LORCET) 10-650 MG per tablet Take 1 tablet by mouth 4 (four) times daily.  60 tablet  2  . midodrine (PROAMATINE) 5 MG tablet 1 tab  8AM, 1 PM and 1 at 6 PM- Pt gets thru the [place]      . NON FORMULARY 1 tablet. hydroflurocortisone .1 mg AM and midafternoon to prevent low blood pressure and dizziness- Pt gets thru the [place]       . pregabalin (LYRICA) 50 MG capsule Take 1 capsule (50 mg total) by mouth 2 (two) times daily.  not taking     . ranitidine (ZANTAC) 150 MG tablet Take 150 mg by mouth 2 (two) times daily.         . simvastatin (ZOCOR) 40 MG tablet Take 40 mg by mouth. 1/2 tab daily- Gets thru the [place]      . temazepam (RESTORIL) 15 MG capsule take 1 capsule by mouth at bedtime if needed  15 capsule  Not taking   0  Past history family history social history reviewed in the electronic medical record.      Objective:   Physical Exam BP 100/64  Pulse 54  Temp 98.3 F (36.8 C) (Oral)  Wt 185 lb (83.915 kg)  SpO2 96% WDWN in nad non toxic  Looks well. Oriented x 3 and no noted deficits in memory, attention, and speech. HEENT grossly nl  Non focal Neck: Supple without adenopathy or masses or bruits Chest:  Clear to A without wheezes rales or rhonchi CV:  S1-S2 no gallops or murmurs peripheral perfusion is normal MS gait mildy antalgic favors  Left  But steady after arising from chair .  Needs to push off to arise from chair.   reviweed labs from [place]     Assessment & Plan:  DJD opiate pain control for years.  Seems to not have sig se and tolerant of such and on stable dosing continue to follow .   Other meds given at [facility] but disease states management in various places.  PN not progressed per hx  Didn't do well on lyrica  And cost an issue.  Risk of fall but not falling and pretty active for age.  Not diabetic and b12 is good. Hx of hypotension and on florinef  ? When eval was done  dont have records for this   CAD stable LIPIDS  On meds at goal  Intermittent dizziness that  In the past did better with medicine per dr Scotty Court   Reviewed record and appears to have been the antivert that we have limited.   Give small amount of lower dose with Cautions  Sleep off the sleeping pill and although some issue with sleep think risk greater than benefit. Esp since takes narcotic at night . Total visit > 50% spent counseling and coordinating care    ROV in 4-6 months or as needed

## 2011-11-06 DIAGNOSIS — G479 Sleep disorder, unspecified: Secondary | ICD-10-CM | POA: Insufficient documentation

## 2012-01-23 ENCOUNTER — Other Ambulatory Visit: Payer: Self-pay | Admitting: Family Medicine

## 2012-01-27 ENCOUNTER — Telehealth: Payer: Self-pay | Admitting: Family Medicine

## 2012-01-27 NOTE — Telephone Encounter (Signed)
Ok to refill x 3 

## 2012-01-27 NOTE — Telephone Encounter (Signed)
Pt is requesting a refill.  Last seen 11/05/11.  Has fu on 05/07/12.  Last filled on 11/05/11 #60 with 3 additional refills.  Please advise.  Thanks!!!

## 2012-01-28 ENCOUNTER — Other Ambulatory Visit: Payer: Self-pay | Admitting: Internal Medicine

## 2012-01-28 MED ORDER — HYDROCODONE-ACETAMINOPHEN 10-650 MG PO TABS: 1.0000 | ORAL_TABLET | Freq: Four times a day (QID) | ORAL | Status: DC | PRN

## 2012-01-28 NOTE — Telephone Encounter (Signed)
Called and left message on voicemail.

## 2012-03-05 ENCOUNTER — Ambulatory Visit (INDEPENDENT_AMBULATORY_CARE_PROVIDER_SITE_OTHER): Payer: Medicare PPO | Admitting: Family Medicine

## 2012-03-05 DIAGNOSIS — Z23 Encounter for immunization: Secondary | ICD-10-CM

## 2012-03-06 NOTE — Telephone Encounter (Signed)
Opened in error

## 2012-04-06 ENCOUNTER — Other Ambulatory Visit: Payer: Self-pay | Admitting: Family Medicine

## 2012-04-06 NOTE — Telephone Encounter (Signed)
Pt is requesting refills.  Last seen on 11/05/11 and has a follow up on 05/07/12.  Last filled on 01/28/12 #60 with 3 additional refills.  I verified with the pharmacy that the pt does need refills.  All refills have been used.  Please advise.  Thanks!!!

## 2012-04-07 MED ORDER — HYDROCODONE-ACETAMINOPHEN 10-650 MG PO TABS: 1.0000 | ORAL_TABLET | Freq: Four times a day (QID) | ORAL | Status: DC | PRN

## 2012-04-07 NOTE — Telephone Encounter (Signed)
Ok to refill x 3 

## 2012-04-29 ENCOUNTER — Telehealth: Payer: Self-pay | Admitting: Family Medicine

## 2012-04-29 NOTE — Telephone Encounter (Signed)
This patient is requesting a refill through [facility] on [address].  He was last seen on 11/05/11 and has a 6 month follow up on 05/07/12.  Last filled on 04/07/12 #60 with 3 refills.  Please advise.  Thanks!!!

## 2012-04-29 NOTE — Telephone Encounter (Signed)
Refill 60 x 1( are we sure he has used 3 refills less than a month ago ?)

## 2012-05-01 NOTE — Telephone Encounter (Signed)
The patient did have refills on his prescription and filled it on 04/25/12.  No other assistance needed.

## 2012-05-07 ENCOUNTER — Encounter: Payer: Self-pay | Admitting: Internal Medicine

## 2012-05-07 ENCOUNTER — Ambulatory Visit (INDEPENDENT_AMBULATORY_CARE_PROVIDER_SITE_OTHER): Payer: Medicare PPO | Admitting: Internal Medicine

## 2012-05-07 VITALS — BP 108/70 | HR 68 | Temp 97.4°F | Wt 183.0 lb

## 2012-05-07 DIAGNOSIS — I10 Essential (primary) hypertension: Secondary | ICD-10-CM

## 2012-05-07 DIAGNOSIS — Z79899 Other long term (current) drug therapy: Secondary | ICD-10-CM

## 2012-05-07 DIAGNOSIS — G609 Hereditary and idiopathic neuropathy, unspecified: Secondary | ICD-10-CM

## 2012-05-07 DIAGNOSIS — I2510 Atherosclerotic heart disease of native coronary artery without angina pectoris: Secondary | ICD-10-CM

## 2012-05-07 DIAGNOSIS — E785 Hyperlipidemia, unspecified: Secondary | ICD-10-CM

## 2012-05-07 LAB — CBC WITH DIFFERENTIAL/PLATELET
Basophils Relative: 0.8 % (ref 0.0–3.0)
Eosinophils Absolute: 0.2 10*3/uL (ref 0.0–0.7)
HCT: 41.3 % (ref 39.0–52.0)
Hemoglobin: 13.9 g/dL (ref 13.0–17.0)
Lymphocytes Relative: 31.2 % (ref 12.0–46.0)
Lymphs Abs: 1.9 10*3/uL (ref 0.7–4.0)
MCHC: 33.7 g/dL (ref 30.0–36.0)
MCV: 91 fl (ref 78.0–100.0)
Monocytes Absolute: 0.5 10*3/uL (ref 0.1–1.0)
Neutro Abs: 3.4 10*3/uL (ref 1.4–7.7)
RBC: 4.54 Mil/uL (ref 4.22–5.81)
RDW: 14.1 % (ref 11.5–14.6)

## 2012-05-07 LAB — BASIC METABOLIC PANEL
CO2: 24 mEq/L (ref 19–32)
Chloride: 109 mEq/L (ref 96–112)
Glucose, Bld: 81 mg/dL (ref 70–99)
Potassium: 4.2 mEq/L (ref 3.5–5.1)
Sodium: 141 mEq/L (ref 135–145)

## 2012-05-07 LAB — HEPATIC FUNCTION PANEL
Albumin: 4.2 g/dL (ref 3.5–5.2)
Alkaline Phosphatase: 61 U/L (ref 39–117)
Total Protein: 7 g/dL (ref 6.0–8.3)

## 2012-05-07 MED ORDER — HYDROCODONE-ACETAMINOPHEN 10-650 MG PO TABS: ORAL_TABLET | ORAL | Status: DC

## 2012-05-07 NOTE — Patient Instructions (Addendum)
Will notify you  of labs when available. Get cholesterol check   At the [place].    ROV in 6 months  Make sure that  Your other doctors and the V send [place] copies of their labs and notes.

## 2012-05-07 NOTE — Progress Notes (Signed)
Chief Complaint  Patient presents with  . Follow-up    HPI: Patient comes in today for follow up of  multiple medical problems.   [facility] for carotid and vascular assessment  No change in situation . Denies cp sob syncope or bleeding  Tries to pain pill  bid but sometimes  More than that .  Extra at night at times .  Takes hs   . To help get through the night  Checks his feet no ulcers change in vision   Last labs scanned in MAY 2013 and nl lipids checked at [place]   Wife sick recently with nv and wheezing  Was at the ed recently. ROS: See pertinent positives and negatives per HPI.  utd on hcp per pt   Past Medical History  Diagnosis Date  . Arthritis   . CAD (coronary artery disease)   . Hypertension   . Hyperlipemia   . Hx of varicella     Family History  Problem Relation Age of Onset  . Other Mother 21    brain aneursym  . Anuerysm Mother   . Heart attack Father     History   Social History  . Marital Status: Married    Spouse Name: [name]    Number of Children: [name]  . Years of Education: [name]   Social History Main Topics  . Smoking status: Former Games developer  . Smokeless tobacco: Current User    Types: Chew    Last Attempt to Quit: 04/15/1978  . Alcohol Use: No  . Drug Use: No  . Sexually Active: No   Other Topics Concern  . 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 Cyanocobalamin (B-12 PO) Take by mouth.      . ferrous sulfate 325 (65 FE) MG tablet Take 325 mg by mouth daily with breakfast.        . HYDROcodone-acetaminophen (LORCET) 10-650 MG per tablet Take 1 po maximum every 6 hours for pain.  70 tablet  2  . midodrine (PROAMATINE) 5 MG tablet 1 tab   8AM, 1 PM and 1 at 6 PM- Pt gets thru the [place]      . NON FORMULARY 1 tablet. hydroflurocortisone .1 mg AM and midafternoon to prevent low blood pressure and dizziness- Pt gets thru the [place]       . ranitidine (ZANTAC) 150 MG tablet Take 150 mg by mouth 2 (two) times daily.        . simvastatin (ZOCOR) 40 MG tablet Take 40 mg by mouth. 1/2 tab daily- Gets thru the [place]      . [DISCONTINUED] HYDROcodone-acetaminophen (LORCET) 10-650 MG per tablet Take 1 tablet by mouth every 6 (six) hours as needed for pain.  60 tablet  3  . [DISCONTINUED] b complex vitamins capsule Take 1 capsule by mouth 2 (two) times daily.          EXAM:  BP 108/70  Pulse 68  Temp 97.4 F (36.3 C) (Oral)  Wt 183 lb (83.008 kg)  There is no height on file to calculate BMI.  GENERAL: vitals reviewed and listed above, alert, oriented, appears  well hydrated and in no acute distress  HEENT: atraumatic, conjunctiva  clear, no obvious abnormalities on inspection of external nose and ears OP : no lesion edema or exudate  NECK: no obvious masses on inspection palpation  LUNGS: clear to auscultation bilaterally, no wheezes, rales or rhonchi, good air movement  CV: HRRR, no clubbing cyanosis or  peripheral edema nl cap refill  MS: moves all extremities without noticeable focal  abnormality Feet  No ulcer nl pulses some dry scale nails show onychomycotic  No redness Gait  Pretty steady  PSYCH: pleasant and cooperative, no obvious depression or anxiety Lab Results  Component Value Date   WBC 5.9 05/07/2012   HGB 13.9 05/07/2012   HCT 41.3 05/07/2012   PLT 200.0 05/07/2012   GLUCOSE 81 05/07/2012   CHOL  Value: 165        ATP III CLASSIFICATION:  <200     mg/dL   Desirable  102-725  mg/dL   Borderline High  >=366    mg/dL   High        07/17/345   TRIG 108 07/18/2010   HDL 79 07/18/2010   LDLCALC  Value: 64        Total Cholesterol/HDL:CHD Risk Coronary Heart Disease Risk Table                     Men   Women  1/2 Average Risk   3.4    3.3  Average Risk       5.0   4.4  2 X Average Risk   9.6   7.1  3 X Average Risk  23.4   11.0        Use the calculated Patient Ratio above and the CHD Risk Table to determine the patient's CHD Risk.        ATP III CLASSIFICATION (LDL):  <100     mg/dL   Optimal  425-956  mg/dL   Near or Above                    Optimal  130-159  mg/dL   Borderline  387-564  mg/dL   High  >332     mg/dL   Very High 12/19/1882   ALT 11 05/07/2012   AST 19 05/07/2012   NA 141 05/07/2012   K 4.2 05/07/2012   CL 109 05/07/2012   CREATININE 1.2 05/07/2012   BUN 18 05/07/2012   CO2 24 05/07/2012   TSH 3.18 06/12/2011   PSA 0.49 Test Methodology: Hybritech PSA 07/18/2010    ASSESSMENT AND PLAN:  Discussed the following assessment and plan:  1. PERIPHERAL NEUROPATHY  Cyanocobalamin (B-12 PO), Basic metabolic panel, CBC with Differential, Hepatic function panel  2. High risk medication use  Cyanocobalamin (B-12 PO), Basic metabolic panel, CBC with Differential, Hepatic function panel   seems the best option at present and stable  risk benefit disc   3. HYPERTENSION  Cyanocobalamin (B-12 PO), Basic metabolic panel, CBC with Differential, Hepatic function panel  4. CORONARY ARTERY DISEASE     sees cardiology no recent visit  5. HYPERLIPIDEMIA     [facility]  will [facility] med  To 70 per month for now  rov in 6 months refill as needed  Seems to be doing fairly well independent  actually care taking sick wife at present -Patient advised to return or notify health care team  immediately if symptoms worsen or persist or new concerns arise.  Patient Instructions  Will notify you  of labs when available. Get cholesterol check   At the [place].    ROV in 6 months  Make sure that  Your other doctors and the [facility] send [place] copies of their labs and notes.   Neta Mends. Mimie Goering M.D.

## 2012-05-11 ENCOUNTER — Encounter: Payer: Self-pay | Admitting: Family Medicine

## 2012-06-10 ENCOUNTER — Telehealth: Payer: Self-pay | Admitting: Family Medicine

## 2012-06-10 NOTE — Telephone Encounter (Signed)
The patient's strength of hydrocodone is no longer available.  What are you willing to change this to?

## 2012-06-11 ENCOUNTER — Telehealth: Payer: Self-pay | Admitting: Internal Medicine

## 2012-06-11 NOTE — Telephone Encounter (Signed)
Another message has been sent to [facility].  Waiting on her response.  See other message.

## 2012-06-11 NOTE — Telephone Encounter (Signed)
Caller: Austin Rose/Patient; Phone: [phone]; Reason for Call: All the pain medication with 500 mg or more of Tylenol has been removed from the shelves and not available they still can do the 325 mg Tylenol He was written for 650 mg Tylenol in his pain medication.  They will need a new order.  Please

## 2012-06-11 NOTE — Telephone Encounter (Signed)
Change to hydrocodone 10/325 same instructions.

## 2012-06-12 ENCOUNTER — Other Ambulatory Visit: Payer: Self-pay | Admitting: Family Medicine

## 2012-06-12 MED ORDER — HYDROCODONE-ACETAMINOPHEN 10-325 MG PO TABS: 1.0000 | ORAL_TABLET | Freq: Four times a day (QID) | ORAL | Status: DC | PRN

## 2012-06-12 NOTE — Telephone Encounter (Signed)
Changed in the system and called to the pharmacy.  Left on voicemail.

## 2012-06-17 ENCOUNTER — Telehealth: Payer: Self-pay | Admitting: Internal Medicine

## 2012-06-17 NOTE — Telephone Encounter (Signed)
I spoke to the pt.  He does not have a fever.  Has a cough and nasal congestion. Cough is productive of a small amount of yellow phlegm.  No sob or problems breathing.  He would like a cough medication sent to the pharmacy.

## 2012-06-17 NOTE — Telephone Encounter (Signed)
Uncertain if cough medications work.  rx cough meds are hydrocodone type that are in his pain pill.  Can rx hycodan  Disp 180 cc 1-2 tsp q 4-6 hours prn cough but dont take with his pain pill.

## 2012-06-17 NOTE — Telephone Encounter (Signed)
Patient Information:  Caller Name: Klein  Phone: [phone]  Patient: Austin Rose, Austin Rose  Gender: Male  DOB: 05/09/1925  Age: 79 Years  PCP: [facility])  Office Follow Up:  Does the office need to follow up with this patient?: Yes  Instructions For The Office: OFFICE PLEASE CALL PT BACK AT [phone] REGARDING PT REQUESTING COUGH MEDICATION BE CALLED IN TO [facility],  THE NUMBER IS IN HIS CHART.  PT REFUSED APPT.  Thanks.  RN Note:  pt refuses appt, would like note sent to MD requesting cough medication be called in.  Symptoms  Reason For Call & Symptoms: Pt calling today 06/17/12 regarding has a cough and wants to know if MD will call in some cough medication.  Also runny nose in AM.  Says coughs up yellow phlegm.  Reviewed Health History In EMR: Yes  Reviewed Medications In EMR: Yes  Reviewed Allergies In EMR: Yes  Reviewed Surgeries / Procedures: Yes  Date of Onset of Symptoms: 06/10/2012  Treatments Tried: OTC cough meds  Treatments Tried Worked: No  Guideline(s) Used:  Colds  Cough  Disposition Per Guideline:   See Today or Tomorrow in Office  Reason For Disposition Reached:   Continuous (nonstop) coughing interferes with work or school and no improvement using cough treatment per Care Advice  Advice Given:  Coughing Spasms:  Drink warm fluids. Inhale warm mist (Reason: both relax the airway and loosen up the phlegm).  Prevent Dehydration:  Drink adequate liquids.  This will help soothe an irritated or dry throat and loosen up the phlegm.  Call Back If:  Difficulty breathing  You become worse.  Patient Refused Recommendation:  Patient Requests Prescription  requests cough medication be called in.

## 2012-06-18 ENCOUNTER — Other Ambulatory Visit: Payer: Self-pay | Admitting: Family Medicine

## 2012-06-18 MED ORDER — HYDROCODONE-HOMATROPINE 5-1.5 MG/5ML PO SYRP: ORAL_SOLUTION | ORAL | Status: DC

## 2012-06-18 NOTE — Telephone Encounter (Signed)
Patient's wife notified that rx was called to the pharmacy and notified that he should NOT take with his hydrocodone.

## 2012-07-01 ENCOUNTER — Other Ambulatory Visit: Payer: Self-pay | Admitting: Internal Medicine

## 2012-08-24 ENCOUNTER — Telehealth: Payer: Self-pay | Admitting: Internal Medicine

## 2012-08-24 NOTE — Telephone Encounter (Signed)
PT called in to request a refill of his HYDROcodone-acetaminophen (NORCO) 10-325 MG per tablet. Please assist.

## 2012-08-25 ENCOUNTER — Other Ambulatory Visit: Payer: Self-pay | Admitting: Internal Medicine

## 2012-08-25 NOTE — Telephone Encounter (Signed)
Can refill x 2  

## 2012-08-25 NOTE — Telephone Encounter (Signed)
Pt is going out of town tomorrow

## 2012-08-25 NOTE — Telephone Encounter (Signed)
Last seen: 05/07/12 Last filled: 07/01/12 #70 with 2 refills Has a follow up scheduled on 11/04/12. Please advise.  Thanks!!

## 2012-08-26 NOTE — Telephone Encounter (Signed)
Called to the pharmacy and left on voicemail. 

## 2012-10-02 ENCOUNTER — Other Ambulatory Visit: Payer: Self-pay | Admitting: Internal Medicine

## 2012-10-05 NOTE — Telephone Encounter (Signed)
Pt wife calling to inquire about this refill. Please assist.

## 2012-10-27 ENCOUNTER — Other Ambulatory Visit: Payer: Self-pay | Admitting: Internal Medicine

## 2012-10-27 NOTE — Telephone Encounter (Signed)
Last filled on 10/02/12 #70 with 0 additional refills Has an upcoming appointment on 11/04/12 Last seen on 05/07/12 Please advise. Thanks!

## 2012-10-27 NOTE — Telephone Encounter (Signed)
Ok to refill x 2  

## 2012-11-04 ENCOUNTER — Encounter: Payer: Self-pay | Admitting: Internal Medicine

## 2012-11-04 ENCOUNTER — Ambulatory Visit (INDEPENDENT_AMBULATORY_CARE_PROVIDER_SITE_OTHER): Payer: Medicare PPO | Admitting: Internal Medicine

## 2012-11-04 VITALS — BP 108/64 | HR 60 | Temp 97.8°F | Wt 178.0 lb

## 2012-11-04 DIAGNOSIS — I10 Essential (primary) hypertension: Secondary | ICD-10-CM

## 2012-11-04 DIAGNOSIS — G609 Hereditary and idiopathic neuropathy, unspecified: Secondary | ICD-10-CM

## 2012-11-04 DIAGNOSIS — J069 Acute upper respiratory infection, unspecified: Secondary | ICD-10-CM | POA: Insufficient documentation

## 2012-11-04 DIAGNOSIS — Z79899 Other long term (current) drug therapy: Secondary | ICD-10-CM

## 2012-11-04 MED ORDER — HYDROCODONE-HOMATROPINE 5-1.5 MG/5ML PO SYRP: ORAL_SOLUTION | ORAL | Status: DC

## 2012-11-04 NOTE — Progress Notes (Addendum)
Chief Complaint  Patient presents with  . Follow-up    HPI: Patient comes in today for follow up of  multiple medical problems.  Has a bad cold over a week ago.   Coughing up phelgm.   In am . Coughing  Getting better . But could ulse some cough med  To help. ROS: See pertinent positives and negatives per HPI. No cp sob no falling.   But fell off chair in yard.  Plastic chair collapse  Sore on right     Last week.   Sore but not to breath no bruising  .  No fever . Wheezing  Still taking bid pain pill for neuropathic burning pain.    Past Medical History  Diagnosis Date  . Arthritis   . CAD (coronary artery disease)   . Hypertension   . Hyperlipemia   . Hx of varicella     Family History  Problem Relation Age of Onset  . Other Mother 63    brain aneursym  . Anuerysm Mother   . Heart attack Father     History   Social History  . Marital Status: Married    Spouse Name: [name]    Number of Children: [name]  . Years of Education: [name]   Social History Main Topics  . Smoking status: Former Games developer  . Smokeless tobacco: Current User    Types: Chew    Last Attempt to Quit: 04/15/1978  . Alcohol Use: No  . Drug Use: No  . Sexually Active: No   Other Topics Concern  . 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   2 use tobacco no significant alcohol   No ets   Has fa                Outpatient Encounter Prescriptions as of 11/04/2012  Medication Sig Dispense Refill  . Cyanocobalamin (B-12 PO) Take by mouth.      . ferrous sulfate 325 (65 FE) MG tablet Take 325 mg by mouth daily with breakfast.        . HYDROcodone-acetaminophen (NORCO) 10-325 MG per tablet TAKE 1 TABLET BY MOUTH EVERY 6 HOURS AS NEEDED FOR PAIN  70 tablet  1  . midodrine (PROAMATINE) 5 MG tablet 1 tab  8AM, 1 PM and 1 at 6 PM- Pt gets thru the [place]      . NON FORMULARY 1  tablet. hydroflurocortisone .1 mg AM and midafternoon to prevent low blood pressure and dizziness- Pt gets thru the [place]       . ranitidine (ZANTAC) 150 MG tablet Take 150 mg by mouth 2 (two) times daily.        . simvastatin (ZOCOR) 40 MG tablet Take 40 mg by mouth. 1/2 tab daily- Gets thru the [place]      . HYDROcodone-homatropine (HYCODAN) 5-1.5 MG/5ML syrup Take 1-2 tsp every 4-6 hours as needed for cough  180 mL  0  . [DISCONTINUED] HYDROcodone-homatropine (HYCODAN) 5-1.5 MG/5ML syrup Take 1-2 tsp every 4-6 hours as needed for cough  180 mL  0   No facility-administered encounter medications on file as of 11/04/2012.    EXAM:  BP 108/64  Pulse 60  Temp(Src) 97.8 F (36.6 C) (Oral)  Wt 178 lb (80.74 kg)  BMI  27.45 kg/m2  Body mass index is 27.45 kg/(m^2).  GENERAL: vitals reviewed and listed above, alert, oriented, appears well hydrated and in no acute distress  HEENT: atraumatic, conjunctiva  clear, no obvious abnormalities on inspection of external nose and ears OP : no lesion edema or exudate   NECK: no obvious masses on inspection palpation  No adrenopthay  LUNGS: clear to auscultation bilaterally, no wheezes, rales or rhonchi, good air movement Sore ness right upper chest wall but no creptitus bruising  Or  Chest pressure tenderness   CV: HRRR, no clubbing cyanosis or  peripheral edema nl cap refill  Skin sun changes   Some SD changes on face  MS: moves all extremities without noticeable focal  Abnormality FEET   : no ulcers some scale  Thickened toenails  Pulse present  Dec?   PSYCH: pleasant and cooperative, no obvious depression or anxiety Lab Results  Component Value Date   WBC 5.9 05/07/2012   HGB 13.9 05/07/2012   HCT 41.3 05/07/2012   PLT 200.0 05/07/2012   GLUCOSE 81 05/07/2012   CHOL  Value: 165        ATP III CLASSIFICATION:  <200     mg/dL   Desirable  784-696  mg/dL   Borderline High  >=295    mg/dL   High        05/23/4130   TRIG 108 07/18/2010   HDL 79 07/18/2010    LDLCALC  Value: 64        Total Cholesterol/HDL:CHD Risk Coronary Heart Disease Risk Table                     Men   Women  1/2 Average Risk   3.4   3.3  Average Risk       5.0   4.4  2 X Average Risk   9.6   7.1  3 X Average Risk  23.4   11.0        Use the calculated Patient Ratio above and the CHD Risk Table to determine the patient's CHD Risk.        ATP III CLASSIFICATION (LDL):  <100     mg/dL   Optimal  440-102  mg/dL   Near or Above                    Optimal  130-159  mg/dL   Borderline  725-366  mg/dL   High  >440     mg/dL   Very High 06/17/7423   ALT 11 05/07/2012   AST 19 05/07/2012   NA 141 05/07/2012   K 4.2 05/07/2012   CL 109 05/07/2012   CREATININE 1.2 05/07/2012   BUN 18 05/07/2012   CO2 24 05/07/2012   TSH 3.18 06/12/2011   PSA 0.49 Test Methodology: Hybritech PSA 07/18/2010    ASSESSMENT AND PLAN:  Discussed the following assessment and plan:  High risk medication use - risk benefit   PERIPHERAL NEUROPATHY - using norco  no change  HYPERTENSION  Viral URI with cough - resolving  nl chest exam  Can continue same regimen on pain med  For now  [place] does ? Disease management and we are tasked with the pain management.    Depression screen fall prevntion -Patient advised to return or notify health care team  if symptoms worsen or persist or new concerns arise.  Patient Instructions  Your chest exam is  Normal  Today.   But if  coughing getting worse or getting fever .Contact our office.  Cough medicine as needed  Caution to not fall.   Continue to get  [place] copies or your labs as they come in.   ROV in 6 months we can do labs if not done at [place] at that time.   We can do labs at that visit if needed         Neta Mends. Aamna Mallozzi M.D.  July 30. Received and reviewed the laboratory tests done at the [facility] and cells very. Lipids were in good range LDL 60 TSH was 7.26 and his doctor Dr. Deatra James reported they would continue to monitor the labs.

## 2012-11-04 NOTE — Patient Instructions (Addendum)
Your chest exam is  Normal  Today.   But if  coughing getting worse or getting fever .Contact our office.  Cough medicine as needed  Caution to not fall.   Continue to get  [place] copies or your labs as they come in.   ROV in 6 months we can do labs if not done at [place] at that time.   We can do labs at that visit if needed

## 2012-11-30 ENCOUNTER — Other Ambulatory Visit: Payer: Self-pay | Admitting: Internal Medicine

## 2012-12-01 NOTE — Telephone Encounter (Signed)
It appears this is early for refill request if he got   81 with a refill  At [facility].( 35 days)   Can give  Refill x 2   On or after  9 /8/14

## 2012-12-01 NOTE — Telephone Encounter (Signed)
Last filled on 10/27/12 #70 with 1 additional refill Last seen on 11/04/12 No future appt. Please advise. Thanks!

## 2013-01-28 ENCOUNTER — Telehealth: Payer: Self-pay | Admitting: Internal Medicine

## 2013-01-28 NOTE — Telephone Encounter (Signed)
Pt requesting refill of HYDROcodone-acetaminophen (NORCO) 10-325 MG per tablet please call when available for pickup.

## 2013-01-29 MED ORDER — HYDROCODONE-ACETAMINOPHEN 10-650 MG PO TABS: ORAL_TABLET | ORAL | Status: DC

## 2013-01-29 MED ORDER — HYDROCODONE-ACETAMINOPHEN 10-325 MG PO TABS: ORAL_TABLET | ORAL | Status: DC

## 2013-01-29 NOTE — Telephone Encounter (Signed)
Rx last filled 10/27/2012 #70 with 1 rf.  Pt last seen 11/04/12.  Pls advise.

## 2013-01-29 NOTE — Telephone Encounter (Signed)
Can rx  #70  With second rx #70

## 2013-01-29 NOTE — Telephone Encounter (Signed)
Called pt to make aware rx ready for pick up for Monday.

## 2013-03-12 ENCOUNTER — Telehealth: Payer: Self-pay | Admitting: Internal Medicine

## 2013-03-12 NOTE — Telephone Encounter (Signed)
Pt request refill HYDROcodone-acetaminophen (NORCO) 10-325 MG per tablet Pt aware provider out of office today.

## 2013-03-15 ENCOUNTER — Encounter: Payer: Self-pay | Admitting: Internal Medicine

## 2013-03-15 ENCOUNTER — Other Ambulatory Visit: Payer: Self-pay | Admitting: Family Medicine

## 2013-03-15 MED ORDER — HYDROCODONE-ACETAMINOPHEN 10-325 MG PO TABS: ORAL_TABLET | ORAL | Status: DC

## 2013-03-15 NOTE — Telephone Encounter (Signed)
Patient notified to pick up at the front desk.  Pt will also need urine testing and a Feb appt.

## 2013-03-15 NOTE — Telephone Encounter (Signed)
Last filled on 01/29/13 #70 with 0 additional refills Has no future appointment scheduled Last seen on 11/04/12 Please advise. Thanks!

## 2013-03-15 NOTE — Telephone Encounter (Signed)
Can refill x 1   [facility] due in February   Have him schedule

## 2013-04-02 ENCOUNTER — Telehealth: Payer: Self-pay | Admitting: Internal Medicine

## 2013-04-02 NOTE — Telephone Encounter (Signed)
Last filled on 03/15/13 #70 with 0 additional refills Has a future appointment on 05/18/13 Last seen on 11/04/12 Please advise. Thanks!

## 2013-04-02 NOTE — Telephone Encounter (Signed)
Pt needs new rx hydrocodone 10-325mg 

## 2013-04-07 MED ORDER — HYDROCODONE-ACETAMINOPHEN 10-325 MG PO TABS: ORAL_TABLET | ORAL | Status: DC

## 2013-04-07 NOTE — Telephone Encounter (Signed)
Ok to refill x 1  

## 2013-04-07 NOTE — Telephone Encounter (Signed)
Ready for pick up

## 2013-04-21 ENCOUNTER — Telehealth: Payer: Self-pay | Admitting: Internal Medicine

## 2013-04-21 NOTE — Telephone Encounter (Signed)
Last filled on 04/07/2013 #70 with 0 additional refills Has a future follow up scheduled on 05/18/2013 Was last seen on 11/04/2012 Please advise.  Thanks!

## 2013-04-21 NOTE — Telephone Encounter (Signed)
Pt needs new rx hydrocodone °

## 2013-04-22 MED ORDER — HYDROCODONE-ACETAMINOPHEN 10-325 MG PO TABS: ORAL_TABLET | ORAL | Status: DC

## 2013-04-22 NOTE — Telephone Encounter (Signed)
Ok to refill x 1  

## 2013-04-22 NOTE — Telephone Encounter (Signed)
Left message for the pt to return my call. 

## 2013-04-22 NOTE — Telephone Encounter (Signed)
Patient notified to pick up at the front desk. 

## 2013-04-22 NOTE — Telephone Encounter (Signed)
Prescription is available for pickup 

## 2013-05-07 ENCOUNTER — Encounter: Payer: Self-pay | Admitting: Internal Medicine

## 2013-05-10 ENCOUNTER — Telehealth: Payer: Self-pay | Admitting: Internal Medicine

## 2013-05-10 MED ORDER — HYDROCODONE-ACETAMINOPHEN 10-325 MG PO TABS: ORAL_TABLET | ORAL | Status: DC

## 2013-05-10 NOTE — Telephone Encounter (Signed)
Patient notified to pick up at the front desk. 

## 2013-05-10 NOTE — Telephone Encounter (Signed)
done

## 2013-05-10 NOTE — Telephone Encounter (Signed)
Pt needs new rx hydrocodone °

## 2013-05-10 NOTE — Telephone Encounter (Signed)
Last filled by [facility] on 04/22/2013 #70 with 0 additional refills Has a future appointment scheduled for 05/18/2013 Last seen on 11/04/2012. Please advise.   Thanks!

## 2013-05-18 ENCOUNTER — Encounter: Payer: Self-pay | Admitting: Internal Medicine

## 2013-05-18 ENCOUNTER — Ambulatory Visit (INDEPENDENT_AMBULATORY_CARE_PROVIDER_SITE_OTHER): Payer: Medicare PPO | Admitting: Internal Medicine

## 2013-05-18 VITALS — BP 134/80 | Temp 97.7°F | Ht 67.5 in | Wt 176.0 lb

## 2013-05-18 DIAGNOSIS — Z79899 Other long term (current) drug therapy: Secondary | ICD-10-CM

## 2013-05-18 DIAGNOSIS — E559 Vitamin D deficiency, unspecified: Secondary | ICD-10-CM | POA: Insufficient documentation

## 2013-05-18 DIAGNOSIS — Z5189 Encounter for other specified aftercare: Secondary | ICD-10-CM

## 2013-05-18 DIAGNOSIS — R946 Abnormal results of thyroid function studies: Secondary | ICD-10-CM

## 2013-05-18 DIAGNOSIS — R7989 Other specified abnormal findings of blood chemistry: Secondary | ICD-10-CM

## 2013-05-18 DIAGNOSIS — M129 Arthropathy, unspecified: Secondary | ICD-10-CM

## 2013-05-18 DIAGNOSIS — Q849 Congenital malformation of integument, unspecified: Secondary | ICD-10-CM

## 2013-05-18 DIAGNOSIS — L989 Disorder of the skin and subcutaneous tissue, unspecified: Secondary | ICD-10-CM | POA: Insufficient documentation

## 2013-05-18 DIAGNOSIS — I10 Essential (primary) hypertension: Secondary | ICD-10-CM

## 2013-05-18 DIAGNOSIS — I951 Orthostatic hypotension: Secondary | ICD-10-CM

## 2013-05-18 DIAGNOSIS — R52 Pain, unspecified: Secondary | ICD-10-CM | POA: Insufficient documentation

## 2013-05-18 MED ORDER — HYDROCODONE-ACETAMINOPHEN 10-325 MG PO TABS: ORAL_TABLET | ORAL | Status: DC

## 2013-05-18 NOTE — Patient Instructions (Signed)
Get dermatology  To see the area on your ear.  Have [facility] follow up with the low vitamin d and thyroid monitoring .  BP is good today on recheck.  Will refill  Pain med fpor 3 months  . return office visit in 4-5 months or a needed.

## 2013-05-18 NOTE — Progress Notes (Signed)
Pre visit review using our clinic review tool, if applicable. No additional management support is needed unless otherwise documented below in the visit note. Chief Complaint  Patient presents with  . Follow-up    HPI: Patient comes in today for follow up of  multiple medical problems.  Basically gets care at the [facility] including his checkups extensive blood work and other medical management but comes here for narcotic pain medication control and local care. Since his last visit he has had  No falling. Uses a cane at times to prevent a problem  Takes hydrocodone pain pill with hurting  Knee swelling left   [facility]  Feels like could lock up.   sometims at night. He has to take an extra asks about refills that can't be done now with a new DEA scheduled medication change law. Takes it about 3 times a day occasionally more sometimes twice a day.  Lipoids and labs good except tsh 7 range   7 range  For the last 2 years   bp was high and dizziness and take more takes a dizzy pill in the morning given by the [place].  To take b12 twice a day   And dec bp pill . He was noted to have a low vitamin D level to take a supplement it was 19 on the lab sheet  Uncertain whent to go back.  Sees dr berry   For card. No new cardiac symptoms at this time.  Has an area on the left ear to look at it's been going on at least a year or more gets irritated uses a lotion or cream on it and it decreases but never goes away. No pain no bleeding.  Reports that he has had a flu shot and got a pneumonia vaccine also at the [place] at his last visit ROS: See pertinent positives and negatives per HPI.  Past Medical History  Diagnosis Date  . Arthritis   . CAD (coronary artery disease)   . Hypertension   . Hyperlipemia   . Hx of varicella     Family History  Problem Relation Age of Onset  . Other Mother 23    brain aneursym  . Anuerysm Mother   . Heart attack Father     History   Social  History  . Marital Status: Married    Spouse Name: [name]    Number of Children: [name]  . Years of Education: [name]   Social History Main Topics  . Smoking status: Former Research scientist (life sciences)  . Smokeless tobacco: Current User    Types: Chew    Last Attempt to Quit: 04/15/1978  . Alcohol Use: No  . Drug Use: No  . Sexual Activity: No   Other Topics Concern  . 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   2 use tobacco no significant alcohol   No ets   Has fa                Outpatient Encounter Prescriptions as of 05/18/2013  Medication Sig  . Cyanocobalamin (B-12 PO) Take by mouth.  . ferrous sulfate 325 (65 FE) MG tablet Take 325 mg by mouth daily with breakfast.    . HYDROcodone-acetaminophen (NORCO) 10-325 MG per tablet TAKE  1 TABLET BY MOUTH EVERY 6 HOURS AS NEEDED FOR PAIN  . midodrine (PROAMATINE) 5 MG tablet 1 tab  8AM, 1 PM and 1 at 6 PM- Pt gets thru the [place]  . NON FORMULARY 1 tablet. hydroflurocortisone .1 mg AM and midafternoon to prevent low blood pressure and dizziness- Pt gets thru the [place]   . ranitidine (ZANTAC) 150 MG tablet Take 150 mg by mouth 2 (two) times daily.    . simvastatin (ZOCOR) 40 MG tablet Take 40 mg by mouth. 1/2 tab daily- Gets thru the [place]  . [DISCONTINUED] HYDROcodone-acetaminophen ([place]) 10-325 MG per tablet TAKE 1 TABLET BY MOUTH EVERY 6 HOURS AS NEEDED FOR PAIN  . [DISCONTINUED] HYDROcodone-acetaminophen (NORCO) 10-325 MG per tablet TAKE 1 TABLET BY MOUTH EVERY 6 HOURS AS NEEDED FOR PAIN  . [DISCONTINUED] HYDROcodone-acetaminophen (NORCO) 10-325 MG per tablet TAKE 1 TABLET BY MOUTH EVERY 6 HOURS AS NEEDED FOR PAIN  . [DISCONTINUED] HYDROcodone-homatropine (HYCODAN) 5-1.5 MG/5ML syrup Take 1-2 tsp every 4-6 hours as needed for cough    EXAM:  BP 134/80  Temp(Src) 97.7 F (36.5 C) (Oral)  Ht 5' 7.5" (1.715 m)  Wt 176 lb  (79.833 kg)  BMI 27.14 kg/m2  Body mass index is 27.14 kg/(m^2).  GENERAL: vitals reviewed and listed above, alert, oriented, appears well hydrated and in no acute distress HEENT: atraumatic, conjunctiva  clear,  TMs intact left pinna with a blotchy area of redness and some central scaliness with yellow golden crust without blistering and oozing or ulceration. No adenopathy noted. Right ear looks normal. NECK: no obvious masses on inspection palpation  LUNGS: clear to auscultation bilaterally, no wheezes, rales or rhonchi, good air movement CV: HRRR, no clubbing cyanosis or nl cap refill  MS: moves all extremities without noticeable focal  abnormality uses a cane to get up from the chair takes a few seconds to steady himself and walks fairly easily with a cane. PSYCH: pleasant and cooperative, no obvious depression or anxiety Laboratory studies reviewed from the [place] LDL is 55 IMA globin 14.6 creatinine 1.3 calcium 4.0. LFTs are normal TSH 7.331 vitamin D is 19.97 ASSESSMENT AND PLAN:  Discussed the following assessment and plan:  Arthropathy, unspecified, other specified sites  High risk medication use  Pain management - Benefit seems to be more than risk patient aware prescription for 70 given with 2 separate prescriptions to followup in about 4 months. Caution discussed   [facility] at [facility]  Unspecified essential hypertension - Blood pressure is good today on repeat  Chronic orthostatic hypotension  Skin abnormalities - Uncertain cause of left ear pinna skin changes cause of chronicity concern about neoplasm versus dermatitis ADVISE check into seeing dermatology at the [facility]  Ear crusting  Left  And comes   Not better.  No bleeding.  -Patient advised to return or notify health care team  if symptoms worsen or persist or new concerns arise.  Patient Instructions  Get dermatology  To see the area on your ear.  Have [facility] follow [facility] with the low  vitamin d and thyroid monitoring .  BP is good today on recheck.  Will refill  Pain med fpor 3 months  . return office visit in 4-5 months or a needed.    Standley Brooking. Panosh M.D.  Total visit 90mins > 50% spent counseling and coordinating care

## 2013-05-19 ENCOUNTER — Telehealth: Payer: Self-pay | Admitting: Internal Medicine

## 2013-05-19 NOTE — Telephone Encounter (Signed)
Relevant patient education mailed to patient.  

## 2013-06-03 ENCOUNTER — Encounter: Payer: Self-pay | Admitting: Internal Medicine

## 2013-06-03 ENCOUNTER — Ambulatory Visit (INDEPENDENT_AMBULATORY_CARE_PROVIDER_SITE_OTHER): Payer: Medicare PPO | Admitting: Internal Medicine

## 2013-06-03 VITALS — BP 92/60 | HR 60 | Temp 97.9°F | Ht 67.5 in | Wt 172.0 lb

## 2013-06-03 DIAGNOSIS — R053 Chronic cough: Secondary | ICD-10-CM

## 2013-06-03 DIAGNOSIS — R059 Cough, unspecified: Secondary | ICD-10-CM

## 2013-06-03 DIAGNOSIS — J209 Acute bronchitis, unspecified: Secondary | ICD-10-CM

## 2013-06-03 DIAGNOSIS — Z720 Tobacco use: Secondary | ICD-10-CM | POA: Insufficient documentation

## 2013-06-03 DIAGNOSIS — R05 Cough: Secondary | ICD-10-CM

## 2013-06-03 DIAGNOSIS — F17200 Nicotine dependence, unspecified, uncomplicated: Secondary | ICD-10-CM

## 2013-06-03 MED ORDER — HYDROCODONE-HOMATROPINE 5-1.5 MG/5ML PO SYRP: 5.0000 mL | ORAL_SOLUTION | ORAL | Status: AC | PRN

## 2013-06-03 MED ORDER — AZITHROMYCIN 250 MG PO TABS: 250.0000 mg | ORAL_TABLET | ORAL | Status: DC

## 2013-06-03 NOTE — Patient Instructions (Signed)
No pneumonia today  Cough med as tolerated . Antibiotic for poss bacterial bronchitis . Contact us if not a lot better in a week to 10 days. Or as needed

## 2013-06-03 NOTE — Progress Notes (Signed)
Chief Complaint  Patient presents with  . Cough    Cough is productive of a yellow sputum.  Cough is ongoing for 3 weeks.  Has tried OTC Vicks and Delsym with no relief.    HPI: Patient comes in today for SDA for  new problem evaluation. Here with wife also   cough for 3-4 weeks  Chest cold not helping.  No sob . Dry cough.  Worse at night.  Wife says is wet  deslym and vick day time.  Yellow phlegm for 2 weeks. No hemoptysis falling fever .  Chews tobacco  No smoking  ROS: See pertinent positives and negatives per HPI.no new cp sob fever chills   Similar to problem had last year and med helped  Wife not coughing at this time  Past Medical History  Diagnosis Date  . Arthritis   . CAD (coronary artery disease)   . Hypertension   . Hyperlipemia   . Hx of varicella     Family History  Problem Relation Age of Onset  . Other Mother 8    brain aneursym  . Anuerysm Mother   . Heart attack Father     History   Social History  . Marital Status: Married    Spouse Name: [name]    Number of Children: [name]  . Years of Education: [name]   Social History Main Topics  . Smoking status: Former Research scientist (life sciences)  . Smokeless tobacco: Current User    Types: Chew    Last Attempt to Quit: 04/15/1978  . Alcohol Use: No  . Drug Use: No  . Sexual Activity: No   Other Topics Concern  . 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   2 use tobacco no significant alcohol   No ets   Has fa                Outpatient Encounter Prescriptions as of 06/03/2013  Medication Sig  . Cyanocobalamin (B-12 PO) Take by mouth.  . ferrous sulfate 325 (65 FE) MG tablet Take 325 mg by mouth daily with breakfast.    . HYDROcodone-acetaminophen (NORCO) 10-325 MG per tablet TAKE 1 TABLET BY MOUTH EVERY 6 HOURS AS NEEDED FOR PAIN  . midodrine (PROAMATINE) 5 MG tablet 1 tab  8AM, 1  PM and 1 at 6 PM- Pt gets thru the [place]  . NON FORMULARY 1 tablet. hydroflurocortisone .1 mg AM and midafternoon to prevent low blood pressure and dizziness- Pt gets thru the [place]   . ranitidine (ZANTAC) 150 MG tablet Take 150 mg by mouth 2 (two) times daily.    . simvastatin (ZOCOR) 40 MG tablet Take 40 mg by mouth. 1/2 tab daily- Gets thru the [place]  . azithromycin (ZITHROMAX Z-PAK) 250 MG tablet Take 1 tablet (250 mg total) by mouth as directed. Take 2 po first day, then 1 po qd  . HYDROcodone-homatropine (HYCODAN) 5-1.5 MG/5ML syrup Take 5 mLs by mouth every 4 (four) hours as needed for cough.    EXAM:  BP 92/60  Pulse 60  Temp(Src) 97.9 F (36.6 C) (Oral)  Ht 5' 7.5" (1.715 m)  Wt 172 lb (78.019 kg)  BMI 26.53 kg/m2  SpO2 %  Body mass index is 26.53 kg/(m^2).  GENERAL: vitals reviewed and listed  above, alert, oriented, appears well hydrated and in no acute distress Face like SD redness quiet respirations  No distress  Non toxic ocass bronchial cough  HEENT: atraumatic, conjunctiva  clear, tms clear wax in right  OP : no lesion edema or exudate  Tongue brown back from chew no ob lesion face non tender mild nasal congestion NECK: no obvious masses on inspection palpation  LUNGS: clear to auscultation bilaterally, no wheezes, rales or rhonchi, good air movement CV: HRRR, no clubbing cyanosis or   nl cap refill  MS: moves all extremities without noticeable focal  Abnormality walks with cane  Slowly  PSYCH: pleasant and cooperative, no obvious depression or anxiety ASSESSMENT AND PLAN:  Discussed the following assessment and plan:  Acute bronchitis - prolonged  almost a month wihtout improvement no obv pna  empric rx and fu if needed   Cough, persistent  Smokeless tobacco use within past 30 days - disc risk   -Patient advised to return or notify health care team  if symptoms worsen or persist or new concerns arise.  Patient Instructions  No pneumonia today  Cough med as tolerated  . Antibiotic for poss bacterial bronchitis . Contact us if not a lot better in a week to 10 days. Or as needed     Standley Brooking. Diahann Guajardo M.D.  Pre visit review using our clinic review tool, if applicable. No additional management support is needed unless otherwise documented below in the visit note.  Cough tha wont go away.

## 2013-06-04 ENCOUNTER — Encounter: Payer: Self-pay | Admitting: Internal Medicine

## 2013-07-13 ENCOUNTER — Telehealth: Payer: Self-pay | Admitting: Internal Medicine

## 2013-07-13 NOTE — Telephone Encounter (Signed)
Pt request rx HYDROcodone-acetaminophen (NORCO) 10-325 MG per tablet ° °

## 2013-07-14 NOTE — Telephone Encounter (Signed)
I spoke to the pt.  He has used all 3 prescriptions that were given to him in Feb.  He says he is taking 3-4 daily and sometimes at night. Please advise.  Thanks!

## 2013-07-14 NOTE — Telephone Encounter (Signed)
Refill x 2 rx   2 separate rx . Of 90  ( this is  an increase) OV before more refills

## 2013-07-14 NOTE — Telephone Encounter (Signed)
Last filled 05/18/13 #70 with 0 additional refills.  Please advise.

## 2013-07-14 NOTE — Telephone Encounter (Signed)
Please review i think he had 3 rx at last visit and usually 30 is monthly  Allotment.

## 2013-07-15 MED ORDER — HYDROCODONE-ACETAMINOPHEN 10-325 MG PO TABS: ORAL_TABLET | ORAL | Status: DC

## 2013-07-15 NOTE — Telephone Encounter (Signed)
Pt following up on request. Pt is having a lot of feet pain. Would like to pu this afternoon. Advised pt he will need appt before the last rx is filled. Pt verbalized understanding.

## 2013-07-15 NOTE — Telephone Encounter (Signed)
Patient notified to pick up rx at the front desk.

## 2013-08-27 ENCOUNTER — Telehealth: Payer: Self-pay | Admitting: Internal Medicine

## 2013-08-27 NOTE — Telephone Encounter (Signed)
Pt has appt on 08-31-13. Pt wife is requesting new rx hydrocodone

## 2013-08-27 NOTE — Telephone Encounter (Signed)
Pt's calling to get update, pt states nurse would call her back after speaking with dr. Regis Bill.

## 2013-08-27 NOTE — Telephone Encounter (Signed)
Spoke to the patient's wife.  He received two prescriptions on 07/15/13 and has used both of them.  She stated his pain has become worse and is using more medication.  Will speak to you about this when he comes in for appt.  Please advise.  Thanks!

## 2013-08-31 ENCOUNTER — Ambulatory Visit (INDEPENDENT_AMBULATORY_CARE_PROVIDER_SITE_OTHER): Payer: Medicare PPO | Admitting: Internal Medicine

## 2013-08-31 ENCOUNTER — Encounter: Payer: Self-pay | Admitting: Internal Medicine

## 2013-08-31 VITALS — BP 112/68 | HR 55 | Temp 97.9°F | Ht 67.5 in | Wt 172.2 lb

## 2013-08-31 DIAGNOSIS — R52 Pain, unspecified: Secondary | ICD-10-CM

## 2013-08-31 DIAGNOSIS — G609 Hereditary and idiopathic neuropathy, unspecified: Secondary | ICD-10-CM

## 2013-08-31 DIAGNOSIS — Z79899 Other long term (current) drug therapy: Secondary | ICD-10-CM

## 2013-08-31 DIAGNOSIS — I2510 Atherosclerotic heart disease of native coronary artery without angina pectoris: Secondary | ICD-10-CM

## 2013-08-31 DIAGNOSIS — Z5189 Encounter for other specified aftercare: Secondary | ICD-10-CM

## 2013-08-31 MED ORDER — GABAPENTIN 100 MG PO CAPS: 100.0000 mg | ORAL_CAPSULE | Freq: Three times a day (TID) | ORAL | Status: DC

## 2013-08-31 MED ORDER — HYDROCODONE-ACETAMINOPHEN 10-325 MG PO TABS: ORAL_TABLET | ORAL | Status: DC

## 2013-08-31 NOTE — Progress Notes (Signed)
Chief Complaint  Patient presents with  . Follow-up    pain meds stc     HPI: Austin Rose  comes in today for follow up of  multiple medical problems.  Seeing in [place]   To get shot in knee.   shouder still aproblem   Left kenee feels like give out and fall.  Taking pain pills mostly 3 times a day recently In am  Then lunch and at night.    ifdont take at night then gets  movement issues and problems with  feet. Helps sleep. Hasn't tried taking half a pill denies falling walking with a cane to steady self. Needs handicapped form resigned renewed. Cardiovascular no significant changes recently sees cardiology Wife is having chronic pain also and may be going in for back surgery.  ROS: See pertinent positives and negatives per HPI. No current chest pain shortness of breath unusual bleeding. Eyes memory problem other problems taking the pain pill he states he has been on it for multiple years and it has helped him. No progression of burning in his feet.  Past Medical History  Diagnosis Date  . Arthritis   . CAD (coronary artery disease)   . Hypertension   . Hyperlipemia   . Hx of varicella     Family History  Problem Relation Age of Onset  . Other Mother 60    brain aneursym  . Anuerysm Mother   . Heart attack Father     History   Social History  . Marital Status: Married    Spouse Name: [name]    Number of Children: [name]  . Years of Education: [name]   Social History Main Topics  . Smoking status: Former Research scientist (life sciences)  . Smokeless tobacco: Current User    Types: Chew    Last Attempt to Quit: 04/15/1978  . Alcohol Use: No  . Drug Use: No  . Sexual Activity: No   Other Topics Concern  . 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   2 use tobacco no significant alcohol   No ets   Has fa                Outpatient Encounter  Prescriptions as of 08/31/2013  Medication Sig  . Cyanocobalamin (B-12 PO) Take by mouth.  . ferrous sulfate 325 (65 FE) MG tablet Take 325 mg by mouth daily with breakfast.    . HYDROcodone-acetaminophen (NORCO) 10-325 MG per tablet TAKE  1 TABLET BY MOUTH as needed  Maximum EVERY 6 HOURS AS NEEDED FOR PAIN.  . midodrine (PROAMATINE) 5 MG tablet 1 tab  8AM, 1 PM and 1 at 6 PM- Pt gets thru the [place]  . NON FORMULARY 1 tablet. hydroflurocortisone .1 mg AM and midafternoon to prevent low blood pressure and dizziness- Pt gets thru the [place]   . ranitidine (ZANTAC) 150 MG tablet Take 150 mg by mouth 2 (two) times daily.    . simvastatin (ZOCOR) 40 MG tablet Take 40 mg by mouth. 1/2 tab daily- Gets thru the [place]  . [DISCONTINUED] azithromycin (ZITHROMAX Z-PAK) 250 MG tablet Take 1 tablet (250 mg total) by mouth as directed. Take 2 po first day, then 1 po qd  . [DISCONTINUED] HYDROcodone-acetaminophen (NORCO) 10-325 MG per tablet TAKE 1  TABLET BY MOUTH EVERY 6 HOURS AS NEEDED FOR PAIN  . [DISCONTINUED] HYDROcodone-acetaminophen (NORCO) 10-325 MG per tablet TAKE 1 TABLET BY MOUTH EVERY 6 HOURS AS NEEDED FOR PAIN.  . [DISCONTINUED] HYDROcodone-acetaminophen (NORCO) 10-325 MG per tablet TAKE  1 TABLET BY MOUTH as needed  Maximum EVERY 6 HOURS AS NEEDED FOR PAIN.  [name] gabapentin (NEURONTIN) 100 MG capsule Take 1 capsule (100 mg total) by mouth 3 (three) times daily. For nerve pain can increase to 2 3 x per day  . [DISCONTINUED] gabapentin (NEURONTIN) 100 MG capsule Take 1 capsule (100 mg total) by mouth 3 (three) times daily. For nerve pain can increase to 2 3 x per day    EXAM:  BP 112/68  Pulse 55  Temp(Src) 97.9 F (36.6 C) (Oral)  Ht 5' 7.5" (1.715 m)  Wt 172 lb 3.2 oz (78.109 kg)  BMI 26.56 kg/m2  SpO2 97%  Body mass index is 26.56 kg/(m^2).  GENERAL: vitals reviewed and listed above, alert, oriented, appears well hydrated and in no acute distress slightly hard of hearing cognitively intact has a  cane ambulatory. HEENT: atraumatic, conjunctiva  clear, no obvious abnormalities on inspection of external nose and ears NECK: no obvious masses on inspection palpation  LUNGS: clear to auscultation bilaterally, no wheezes, rales or rhonchi,  CV: HRRR, no clubbing cyanosis or  peripheral edema nl cap refill  MS: moves all extremities without noticeable focal  abnormality left knee DJD changes no acute effusion or warmth larger than the right knee antalgic gait but ambulatory and dependent. No significant edema PSYCH: pleasant and cooperative, no obvious depression or anxiety  ASSESSMENT AND PLAN:  Discussed the following assessment and plan:  Pain management  High risk medication use  CORONARY ARTERY DISEASE  PERIPHERAL NEUROPATHY Viewed again medication he is up to 3 a day warned that chronic narcotic use may not help acute pain and had side effects patient is aware. Can try taking a half a pill in the middle the day instead of a whole pill. Also prescription for Neurontin to see if it is helpful with not too many side effects for his neuropathy pain. Have him send [place] a copy of labs that he gets done at the [place]   they don't do pain management. Risk benefit of medication is discussed Don't drive with medication Signed handicapped driver's application reapplication. He is up to date for tox screen plan followup visit in about 4 months. -Patient advised to return or notify health care team  if symptoms worsen ,persist or new concerns arise.  Patient Instructions  Pain pills  Lose their   Effect  Sometimes for  chronic pain.  If taking regularly. Try 1/2 dose at times in day  Can also try adding gabapentin for nerve pain    100 mg -200 mg  Up to  3 x per day in addition.    Standley Brooking. Panosh M.D.

## 2013-08-31 NOTE — Patient Instructions (Signed)
Pain pills  Lose their   Effect  Sometimes for  chronic pain.  If taking regularly. Try 1/2 dose at times in day  Can also try adding gabapentin for nerve pain    100 mg -200 mg  Up to  3 x per day in addition.

## 2013-10-05 ENCOUNTER — Telehealth: Payer: Self-pay | Admitting: Internal Medicine

## 2013-10-05 DIAGNOSIS — H61102 Unspecified noninfective disorders of pinna, left ear: Secondary | ICD-10-CM

## 2013-10-05 NOTE — Telephone Encounter (Signed)
I do not see a recommendation for dermatology in your last note.  Please advise.  Thanks!

## 2013-10-05 NOTE — Telephone Encounter (Signed)
Get more information  I hink we talked about seeing derm  but this was peripheral to visit.so didn't record this. Ok to do referral but need the information in record  And diagnosis

## 2013-10-05 NOTE — Telephone Encounter (Signed)
Pt states dr. Regis Bill informed him to see a dermatologist, pt needs a referral for [facility] dermatologist. Needing referral today if possible, [place] dermatologist have a cancellation for tomorrow at 3:30 pt is trying to get the appt.

## 2013-10-06 NOTE — Telephone Encounter (Signed)
Spoke to the patient and wife.  Painful left pinna. Reviewed note from 05/18/13.  Order placed in the system for referral to [facility].

## 2013-10-08 ENCOUNTER — Other Ambulatory Visit: Payer: Self-pay

## 2013-10-18 ENCOUNTER — Telehealth: Payer: Self-pay | Admitting: Internal Medicine

## 2013-10-18 NOTE — Telephone Encounter (Signed)
Pt req rx HYDROcodone-acetaminophen (NORCO) 10-325 MG per tablet ° °

## 2013-10-18 NOTE — Telephone Encounter (Signed)
Ok to refill x 1  

## 2013-10-18 NOTE — Telephone Encounter (Signed)
Last filled on 08/31/13 #90 with 0 additional refills and last seen on the same day. Has no future appointment scheduled.  Please advise.  Thanks!

## 2013-10-19 MED ORDER — HYDROCODONE-ACETAMINOPHEN 10-325 MG PO TABS: ORAL_TABLET | ORAL | Status: DC

## 2013-10-19 NOTE — Telephone Encounter (Signed)
Patient's wife notified that rx is ready for pickup.

## 2013-11-15 ENCOUNTER — Telehealth: Payer: Self-pay | Admitting: Internal Medicine

## 2013-11-15 MED ORDER — HYDROCODONE-ACETAMINOPHEN 10-325 MG PO TABS: ORAL_TABLET | ORAL | Status: DC

## 2013-11-15 NOTE — Telephone Encounter (Signed)
Pt needs new rx hydrocodone °

## 2013-11-15 NOTE — Telephone Encounter (Signed)
Last filled 10/19/13 #90 with 0 additional refills Has no future appt scheduled. Last seen on 08/31/13. Please advise. Thanks!

## 2013-11-15 NOTE — Telephone Encounter (Signed)
Ok x 1

## 2013-11-15 NOTE — Telephone Encounter (Signed)
Pt's wife notified that rx is ready for pick up. 

## 2013-12-03 ENCOUNTER — Telehealth: Payer: Self-pay | Admitting: Internal Medicine

## 2013-12-03 NOTE — Telephone Encounter (Signed)
Pt needs refill on HYDROcodone-acetaminophen (NORCO) 10-325 MG per tablet. °

## 2013-12-03 NOTE — Telephone Encounter (Signed)
Ok to refill x 1  

## 2013-12-06 MED ORDER — HYDROCODONE-ACETAMINOPHEN 10-325 MG PO TABS: ORAL_TABLET | ORAL | Status: DC

## 2013-12-06 NOTE — Telephone Encounter (Signed)
Pt's wife notified to have the pt pick up at the front desk.

## 2013-12-27 ENCOUNTER — Telehealth: Payer: Self-pay | Admitting: Internal Medicine

## 2013-12-27 NOTE — Telephone Encounter (Signed)
Pt needs new rx hydrocodone °

## 2013-12-27 NOTE — Telephone Encounter (Signed)
Also tox screen show positive for temazepam and gabapentin. Temazepam is not on his active med list last prescription in the electronic record was 2013 We'll ask patient about this at his visit.

## 2013-12-27 NOTE — Telephone Encounter (Signed)
Ok to refill x 1  Needs ROV before runs out of this refill

## 2013-12-28 MED ORDER — HYDROCODONE-ACETAMINOPHEN 10-325 MG PO TABS: ORAL_TABLET | ORAL | Status: DC

## 2013-12-28 NOTE — Telephone Encounter (Signed)
Pt notified to pick up rx at the front desk.  Made a follow up appt with him on 01/20/14 @ 9:30 PM.

## 2014-01-18 ENCOUNTER — Telehealth: Payer: Self-pay | Admitting: Internal Medicine

## 2014-01-18 NOTE — Telephone Encounter (Signed)
Pt needs rx hydrocodone

## 2014-01-18 NOTE — Telephone Encounter (Signed)
Ok to refill x 1   Please keep appt this week

## 2014-01-19 MED ORDER — HYDROCODONE-ACETAMINOPHEN 10-325 MG PO TABS: ORAL_TABLET | ORAL | Status: DC

## 2014-01-19 NOTE — Telephone Encounter (Signed)
Pt notified to pick up at the front desk.  Advised to keep his appointment on 01/20/14 @ 9:30.

## 2014-01-20 ENCOUNTER — Encounter: Payer: Self-pay | Admitting: Internal Medicine

## 2014-01-20 ENCOUNTER — Ambulatory Visit (INDEPENDENT_AMBULATORY_CARE_PROVIDER_SITE_OTHER): Payer: Medicare PPO | Admitting: Internal Medicine

## 2014-01-20 VITALS — BP 116/70 | Temp 97.9°F | Ht 67.5 in | Wt 171.2 lb

## 2014-01-20 DIAGNOSIS — R52 Pain, unspecified: Secondary | ICD-10-CM

## 2014-01-20 DIAGNOSIS — I2510 Atherosclerotic heart disease of native coronary artery without angina pectoris: Secondary | ICD-10-CM | POA: Insufficient documentation

## 2014-01-20 DIAGNOSIS — T887XXA Unspecified adverse effect of drug or medicament, initial encounter: Secondary | ICD-10-CM | POA: Insufficient documentation

## 2014-01-20 DIAGNOSIS — M150 Primary generalized (osteo)arthritis: Secondary | ICD-10-CM

## 2014-01-20 DIAGNOSIS — R54 Age-related physical debility: Secondary | ICD-10-CM

## 2014-01-20 DIAGNOSIS — I2583 Coronary atherosclerosis due to lipid rich plaque: Secondary | ICD-10-CM

## 2014-01-20 DIAGNOSIS — IMO0001 Reserved for inherently not codable concepts without codable children: Secondary | ICD-10-CM

## 2014-01-20 DIAGNOSIS — T50905S Adverse effect of unspecified drugs, medicaments and biological substances, sequela: Secondary | ICD-10-CM

## 2014-01-20 DIAGNOSIS — T887XXS Unspecified adverse effect of drug or medicament, sequela: Secondary | ICD-10-CM

## 2014-01-20 DIAGNOSIS — G609 Hereditary and idiopathic neuropathy, unspecified: Secondary | ICD-10-CM

## 2014-01-20 DIAGNOSIS — Z5189 Encounter for other specified aftercare: Secondary | ICD-10-CM

## 2014-01-20 DIAGNOSIS — M159 Polyosteoarthritis, unspecified: Secondary | ICD-10-CM

## 2014-01-20 DIAGNOSIS — Z23 Encounter for immunization: Secondary | ICD-10-CM

## 2014-01-20 DIAGNOSIS — Z79899 Other long term (current) drug therapy: Secondary | ICD-10-CM

## 2014-01-20 NOTE — Patient Instructions (Signed)
Make sure  You get [place] a lab  Blood report done at [facility].      continue same  Pain med for now with caution.  ROV   In 4 months   Or as needed.

## 2014-01-20 NOTE — Progress Notes (Signed)
Pre visit review using our clinic review tool, if applicable. No additional management support is needed unless otherwise documented below in the visit note.  Chief Complaint  Patient presents with  . Follow-up    pain meds     HPI: Austin Rose  comes in today for follow up of   medical problems. '  Since last visit : PAIN:  Gabapentin     Tried   And made him forgetfall and side effect .  Family noted "something was wrong with him "behaviorly Took for about 3-4 days. Stopped and sx subsided  takin  Hydrocodone   About the same   3-4 x per day sometimes 1/2 Arthritis.  Bending over is hard taking  Care of   Still when needed. Wife has been in hospital and he was care taking  CV:   [facility]  Seeing check up  This week.  Usually gets labs at that time ROS: See pertinent positives and negatives per HPI.no cough falling foot ulcers  Ear lesion was scca moderate  Past Medical History  Diagnosis Date  . Arthritis   . CAD (coronary artery disease)   . Hypertension   . Hyperlipemia   . Hx of varicella   . Squamous cell skin cancer     pinna     Family History  Problem Relation Age of Onset  . Other Mother 52    brain aneursym  . Anuerysm Mother   . Heart attack Father     History   Social History  . Marital Status: Married    Spouse Name: [name]    Number of Children: [name]  . Years of Education: [name]   Social History Main Topics  . Smoking status: Former Research scientist (life sciences)  . Smokeless tobacco: Current User    Types: Chew    Last Attempt to Quit: 04/15/1978  . Alcohol Use: No  . Drug Use: No  . Sexual Activity: No   Other Topics Concern  . 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   2 use tobacco no significant alcohol   No ets   Has fa                Outpatient Encounter Prescriptions as of 01/20/2014  Medication Sig  .  Cyanocobalamin (B-12 PO) Take by mouth.  . ferrous sulfate 325 (65 FE) MG tablet Take 325 mg by mouth daily with breakfast.    . HYDROcodone-acetaminophen (NORCO) 10-325 MG per tablet TAKE  1 TABLET BY MOUTH as needed  Maximum EVERY 6 HOURS AS NEEDED FOR PAIN.  . midodrine (PROAMATINE) 5 MG tablet 1 tab  8AM, 1 PM and 1 at 6 PM- Pt gets thru the [place]  . NON FORMULARY 1 tablet. hydroflurocortisone .1 mg AM and midafternoon to prevent low blood pressure and dizziness- Pt gets thru the [place]   . ranitidine (ZANTAC) 150 MG tablet Take 150 mg by mouth 2 (two) times daily.    . simvastatin (ZOCOR) 40 MG tablet Take 40 mg by mouth. 1/2 tab daily- Gets thru the [place]  . [DISCONTINUED] gabapentin (NEURONTIN) 100 MG capsule Take 1 capsule (100 mg total) by mouth 3 (three) times daily. For nerve pain can increase to 2 3 x per day  EXAM:  BP 116/70  Temp(Src) 97.9 F (36.6 C) (Oral)  Ht 5' 7.5" (1.715 m)  Wt 171 lb 3.2 oz (77.656 kg)  BMI 26.40 kg/m2  Body mass index is 26.4 kg/(m^2).  GENERAL: vitals reviewed and listed above, alert, oriented, appears well hydrated and in no acute distress elederly ambulatory without assistance but slow shuffling gait  HEENT: atraumatic, conjunctiva  clear, no obvious abnormalities on inspection of external nose and ears   NECK: no obvious masses on inspection palpation  LUNGS: clear to auscultation bilaterally, no wheezes, rales or rhonchi, good air movement CV: HRRR, no clubbing cyanosis or  peripheral edema nl   PSYCH: pleasant and cooperative, no obvious depression or anxiety Skin senile changes  Gait  Slow Pn type gait  Gowers getting up from chair but independent  And steady otherwise   ASSESSMENT AND PLAN:  Discussed the following assessment and plan:  Hereditary and idiopathic peripheral neuropathy  Pain management  High risk medication use  Need for prophylactic vaccination and inoculation against influenza - Plan: Flu Vaccine QUAD 36+ mos PF IM  (Fluarix Quad PF)  Primary osteoarthritis involving multiple joints  Coronary artery disease due to lipid rich plaque - per cardiology   Age factor  Medication side effect, sequela - gabapentin per pt report   -Patient advised to return or notify health care team  if symptoms worsen ,persist or new concerns arise.  Patient Instructions  Make sure  You get [place] a lab  Blood report done at [facility].      continue same  Pain med for now with caution.  ROV   In 4 months   Or as needed.     Austin Rose. Austin Rose M.D.  Total visit 40mins > 50% spent counseling and coordinating care

## 2014-01-22 ENCOUNTER — Encounter (HOSPITAL_COMMUNITY): Payer: Self-pay | Admitting: Emergency Medicine

## 2014-01-22 ENCOUNTER — Emergency Department (INDEPENDENT_AMBULATORY_CARE_PROVIDER_SITE_OTHER)
Admission: EM | Admit: 2014-01-22 | Discharge: 2014-01-22 | Disposition: A | Discharge status: Home / Self Care | Payer: Medicare PPO | Source: Home / Self Care | Attending: Emergency Medicine | Admitting: Emergency Medicine

## 2014-01-22 ENCOUNTER — Emergency Department (INDEPENDENT_AMBULATORY_CARE_PROVIDER_SITE_OTHER): Payer: Medicare PPO

## 2014-01-22 DIAGNOSIS — S93402A Sprain of unspecified ligament of left ankle, initial encounter: Secondary | ICD-10-CM

## 2014-01-22 DIAGNOSIS — S99919A Unspecified injury of unspecified ankle, initial encounter: Secondary | ICD-10-CM
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
## 2014-01-22 NOTE — ED Provider Notes (Signed)
CSN: [id number]     Arrival date & time 01/22/14  0932 History   First MD Initiated Contact with Patient 01/22/14 [phone]     Chief Complaint  Patient presents with  . Ankle Injury   (Consider location/radiation/quality/duration/timing/severity/associated sxs/prior Treatment) HPI He is an 80 year old man here today for evaluation of left ankle injury. Yesterday, he was mowing his lawn on a riding lawnmower and try to take a branch out of the way with his left foot and his foot got pulled outwards. He reports immediate pain and swelling. He has been able to bear weight on it.  Past Medical History  Diagnosis Date  . Arthritis   . CAD (coronary artery disease)   . Hypertension   . Hyperlipemia   . Hx of varicella   . Squamous cell skin cancer     pinna    Past Surgical History  Procedure Laterality Date  . Coronary artery bypass graft    . Lumbar disc surgery      L4-5  decompression  . Microdisscectomy      bilateral  . Back surgery      15s1Repeat Foraminotomy  . Other surgical history      central decompression  . Cataract surgery      digby    Family History  Problem Relation Age of Onset  . Other Mother 71    brain aneursym  . Anuerysm Mother   . Heart attack Father    History  Substance Use Topics  . Smoking status: Former Research scientist (life sciences)  . Smokeless tobacco: Current User    Types: Chew    Last Attempt to Quit: 04/15/1978  . Alcohol Use: No    Review of Systems  Allergies  Gabapentin and Penicillins  Home Medications   Prior to Admission medications   Medication Sig Start Date End Date Taking? Authorizing Provider  Cyanocobalamin (B-12 PO) Take by mouth.   Yes Historical Provider, MD  ferrous sulfate 325 (65 FE) MG tablet Take 325 mg by mouth daily with breakfast.     Yes Historical Provider, MD  HYDROcodone-acetaminophen (NORCO) 10-325 MG per tablet TAKE  1 TABLET BY MOUTH as needed  Maximum EVERY 6 HOURS AS NEEDED FOR PAIN. 01/19/14  Yes Burnis Medin, MD   midodrine (PROAMATINE) 5 MG tablet 1 tab  8AM, 1 PM and 1 at 6 PM- Pt gets thru the [facility] 08/16/10  Yes Historical Provider, MD  NON FORMULARY 1 tablet. hydroflurocortisone .1 mg AM and midafternoon to prevent low blood pressure and dizziness- Pt gets thru the [place]    Yes Historical Provider, MD  ranitidine (ZANTAC) 150 MG tablet Take 150 mg by mouth 2 (two) times daily.     Yes Historical Provider, MD  simvastatin (ZOCOR) 40 MG tablet Take 40 mg by mouth. 1/2 tab daily- Gets thru the [place]   Yes Historical Provider, MD   BP 91/57  Pulse 57  Temp(Src) 99 F (37.2 C) (Oral)  Resp 16  SpO2 98% Physical Exam  ED Course  Procedures (including critical care time) Labs Review Labs Reviewed - No data to display  Imaging Review Dg Ankle Complete Left  01/22/2014   CLINICAL DATA:  Initial encounter for left ankle injury. Jeanne Ivan left ankle pain and soft tissue swelling. The patient attempted to [place] stick out of the way well riding on a lawn mower and the left foot was pulled backwards.  EXAM: LEFT ANKLE COMPLETE - 3+ VIEW  COMPARISON:  None.  FINDINGS: Soft  tissue swelling is present over the lateral malleolus without underlying fracture. The ankle is located. There is no significant joint effusion. Diabetic vascular calcifications are present.  IMPRESSION: 1. Soft tissue swelling over the lateral malleolus without an underlying fracture. Ligamentous injury is not excluded. 2. Diabetic vascular calcifications.   Electronically Signed   By: Lawrence Santiago M.D.   On: 01/22/2014 10:41     MDM   1. Ankle injury   2. Left ankle sprain, initial encounter    X-rays negative for fracture. Apply ASO splint. Weightbearing as tolerated. Recommended rest, elevation, and icing. Use Tylenol as needed for pain. Followup with primary care provider if needed.    Melony Overly, MD 01/22/14 [phone]

## 2014-01-22 NOTE — Discharge Instructions (Signed)
Ankle Sprain An ankle sprain is an injury to the strong, fibrous tissues (ligaments) that hold your ankle bones together.  HOME CARE   Put ice on your ankle for 1-2 days or as told by your doctor.  Put ice in a plastic bag.  Place a towel between your skin and the bag.  Leave the ice on for 15-20 minutes at a time, every 2 hours while you are awake.  Only take medicine as told by your doctor.  Take tylenol every 8 hours as needed.  Raise (elevate) your injured ankle above the level of your heart as much as possible for 2-3 days.  Use crutches if your doctor tells you to. Slowly put your own weight on the affected ankle. Use the crutches until you can walk without pain.  If you have an air splint:  Add or let out air to make it comfortable.  Take it off at night and to shower and bathe.  Wiggle your toes and move your ankle up and down often while you are wearing it. GET HELP IF:  You have rapidly increasing bruising or puffiness (swelling).  Your toes feel very cold.  You lose feeling in your foot.  Your medicine does not help your pain. GET HELP RIGHT AWAY IF:   Your toes lose feeling (numb) or turn blue.  You have severe pain that is increasing. MAKE SURE YOU:   Understand these instructions.  Will watch your condition.  Will get help right away if you are not doing well or get worse. Document Released: 09/18/2007 Document Revised: 08/16/2013 Document Reviewed: 10/14/2011 [facility] Patient Information 2015 [place], [place]. This information is not intended to replace advice given to you by your health care provider. Make sure you discuss any questions you have with your health care provider.

## 2014-02-10 ENCOUNTER — Telehealth: Payer: Self-pay | Admitting: Internal Medicine

## 2014-02-10 NOTE — Telephone Encounter (Signed)
Ok x 1

## 2014-02-10 NOTE — Telephone Encounter (Signed)
Pt needs new rx hydrocodone °

## 2014-02-11 MED ORDER — HYDROCODONE-ACETAMINOPHEN 10-325 MG PO TABS: ORAL_TABLET | ORAL | Status: DC

## 2014-02-11 NOTE — Telephone Encounter (Signed)
Patient's wife notified to have the pt pick up rx at the front desk.

## 2014-03-02 ENCOUNTER — Telehealth: Payer: Self-pay | Admitting: Internal Medicine

## 2014-03-02 MED ORDER — HYDROCODONE-ACETAMINOPHEN 10-325 MG PO TABS: ORAL_TABLET | ORAL | Status: DC

## 2014-03-02 NOTE — Telephone Encounter (Signed)
Patient would like a re-fill HYDROcodone-acetaminophen (NORCO) 10-325 MG per tablet.  Patient he hurt his ankle and is having a lot of pain.

## 2014-03-02 NOTE — Telephone Encounter (Signed)
Advise  check ankle  Ortho or sports medicine .[name] May we do referral? ( see ed visit  From 10 10 15  about this ankle injury since not better   (Or  OV with me first  If needed)  Ok to refill x 1

## 2014-03-02 NOTE — Telephone Encounter (Signed)
Spoke to the pt.  He does not want a referral at this time.  Informed me that he was seen in an urgent care over the weekend.  Had x-rays done.  No breaks or fractures.  Just sprained.  Advised that he call back to come in and be seen or for a referral if not getting better.  He will pick up his prescription at the front desk.  Pt will call back if needed.

## 2014-03-23 ENCOUNTER — Telehealth: Payer: Self-pay | Admitting: Internal Medicine

## 2014-03-23 NOTE — Telephone Encounter (Signed)
Pt needs new rx hydrocodone. Pt does not have enough med to last until md return on 03-28-14

## 2014-03-24 MED ORDER — HYDROCODONE-ACETAMINOPHEN 10-325 MG PO TABS: ORAL_TABLET | ORAL | Status: DC

## 2014-03-24 NOTE — Telephone Encounter (Signed)
done

## 2014-03-24 NOTE — Telephone Encounter (Signed)
Patient's wife notified to have the pt pick up rx at the front desk.

## 2014-04-01 ENCOUNTER — Telehealth: Payer: Self-pay | Admitting: Internal Medicine

## 2014-04-01 NOTE — Telephone Encounter (Signed)
Pt forgot to call dr Regis Bill this week. Dr fry gave hime 30 tabs on 12/10.  Pt states dr Regis Bill usually gives him 66. Do you think Dr Elease Hashimoto will write him a rx?

## 2014-04-01 NOTE — Telephone Encounter (Signed)
Should wait for Dr. Regis Bill

## 2014-04-01 NOTE — Telephone Encounter (Signed)
Pt needs new rx hydrocodone °

## 2014-04-04 NOTE — Telephone Encounter (Signed)
Pt needs new rx for HYDROcodone-acetaminophen (NORCO) 10-325 MG per tablet. Pt called last Friday but could not a refill.

## 2014-04-05 MED ORDER — HYDROCODONE-ACETAMINOPHEN 10-325 MG PO TABS: ORAL_TABLET | ORAL | Status: DC

## 2014-04-05 NOTE — Telephone Encounter (Addendum)
Pt states he will be in the area this afternoon.  Pt is out of his meds and wants to know if dr Regis Bill can get for him today? Pt states he had a rough night. He has been out all weekend.

## 2014-04-05 NOTE — Telephone Encounter (Signed)
Pt aware a 30 tab script will be given until until Dr Regis Bill can write his reg rx. Pt is going to [facility] today.

## 2014-04-05 NOTE — Telephone Encounter (Signed)
Refilled #30 by Dr. Sarajane Jews.

## 2014-04-06 ENCOUNTER — Encounter: Payer: Self-pay | Admitting: Internal Medicine

## 2014-04-13 ENCOUNTER — Telehealth: Payer: Self-pay | Admitting: Internal Medicine

## 2014-04-13 NOTE — Telephone Encounter (Signed)
Patient is requesting re-fill on HYDROcodone-acetaminophen (NORCO) 10-325 MG per tablet

## 2014-04-14 MED ORDER — HYDROCODONE-ACETAMINOPHEN 10-325 MG PO TABS: ORAL_TABLET | ORAL | Status: DC

## 2014-04-14 NOTE — Telephone Encounter (Signed)
He received 30 on 12 22 in my absence      Dr Sarajane Jews  Madaline Brilliant to refill x 1 disp 90 #

## 2014-04-14 NOTE — Telephone Encounter (Signed)
Pt notified that the office is closing early and should pick up his rx at the front desk by 2 PM.

## 2014-05-09 ENCOUNTER — Telehealth: Payer: Self-pay | Admitting: Internal Medicine

## 2014-05-09 NOTE — Telephone Encounter (Signed)
Ok to refill x 1  

## 2014-05-09 NOTE — Telephone Encounter (Signed)
Pt request refill of the following: HYDROcodone-acetaminophen (NORCO) 10-325 MG per tablet ° ° °Phamacy: ° °

## 2014-05-10 MED ORDER — HYDROCODONE-ACETAMINOPHEN 10-325 MG PO TABS: ORAL_TABLET | ORAL | Status: DC

## 2014-05-10 NOTE — Telephone Encounter (Signed)
Pt notified to pick up at the front desk. 

## 2014-05-10 NOTE — Telephone Encounter (Signed)
Printed for WP to sign. 

## 2014-05-30 ENCOUNTER — Ambulatory Visit: Payer: Medicare PPO | Admitting: Internal Medicine

## 2014-05-30 ENCOUNTER — Telehealth: Payer: Self-pay | Admitting: Internal Medicine

## 2014-05-30 MED ORDER — HYDROCODONE-ACETAMINOPHEN 10-325 MG PO TABS: ORAL_TABLET | ORAL | Status: DC

## 2014-05-30 NOTE — Telephone Encounter (Signed)
OK x 1  Make ROV  To be completed before mediation runs out . He is due for ROV med eval.

## 2014-05-30 NOTE — Telephone Encounter (Signed)
Pt notified to pick up at the front desk.   Has made a future appt for 06/06/14

## 2014-05-30 NOTE — Telephone Encounter (Signed)
Needs a refill on his Hydrocodone. Please call when rx is ready for pick up.

## 2014-06-06 ENCOUNTER — Ambulatory Visit (INDEPENDENT_AMBULATORY_CARE_PROVIDER_SITE_OTHER): Payer: Commercial Managed Care - HMO | Admitting: Internal Medicine

## 2014-06-06 ENCOUNTER — Encounter: Payer: Self-pay | Admitting: Internal Medicine

## 2014-06-06 VITALS — BP 106/64 | Temp 97.3°F | Ht 66.75 in | Wt 166.9 lb

## 2014-06-06 DIAGNOSIS — E785 Hyperlipidemia, unspecified: Secondary | ICD-10-CM

## 2014-06-06 DIAGNOSIS — M150 Primary generalized (osteo)arthritis: Secondary | ICD-10-CM | POA: Diagnosis not present

## 2014-06-06 DIAGNOSIS — R946 Abnormal results of thyroid function studies: Secondary | ICD-10-CM | POA: Diagnosis not present

## 2014-06-06 DIAGNOSIS — R7989 Other specified abnormal findings of blood chemistry: Secondary | ICD-10-CM | POA: Diagnosis not present

## 2014-06-06 DIAGNOSIS — Z79899 Other long term (current) drug therapy: Secondary | ICD-10-CM

## 2014-06-06 DIAGNOSIS — G609 Hereditary and idiopathic neuropathy, unspecified: Secondary | ICD-10-CM

## 2014-06-06 DIAGNOSIS — Z23 Encounter for immunization: Secondary | ICD-10-CM | POA: Diagnosis not present

## 2014-06-06 DIAGNOSIS — I2583 Coronary atherosclerosis due to lipid rich plaque: Secondary | ICD-10-CM

## 2014-06-06 DIAGNOSIS — I2510 Atherosclerotic heart disease of native coronary artery without angina pectoris: Secondary | ICD-10-CM

## 2014-06-06 DIAGNOSIS — M159 Polyosteoarthritis, unspecified: Secondary | ICD-10-CM

## 2014-06-06 DIAGNOSIS — L989 Disorder of the skin and subcutaneous tissue, unspecified: Secondary | ICD-10-CM

## 2014-06-06 DIAGNOSIS — Z5189 Encounter for other specified aftercare: Secondary | ICD-10-CM

## 2014-06-06 DIAGNOSIS — R52 Pain, unspecified: Secondary | ICD-10-CM

## 2014-06-06 LAB — T4, FREE: Free T4: 0.72 ng/dL (ref 0.60–1.60)

## 2014-06-06 LAB — TSH: TSH: 2.62 u[IU]/mL (ref 0.35–4.50)

## 2014-06-06 LAB — CBC WITH DIFFERENTIAL/PLATELET
Basophils Absolute: 0 10*3/uL (ref 0.0–0.1)
Basophils Relative: 0.4 % (ref 0.0–3.0)
EOS PCT: 3.1 % (ref 0.0–5.0)
Eosinophils Absolute: 0.2 10*3/uL (ref 0.0–0.7)
HCT: 40.8 % (ref 39.0–52.0)
Hemoglobin: 13.6 g/dL (ref 13.0–17.0)
Lymphocytes Relative: 29.8 % (ref 12.0–46.0)
Lymphs Abs: 1.9 10*3/uL (ref 0.7–4.0)
MCHC: 33.4 g/dL (ref 30.0–36.0)
MCV: 87.8 fl (ref 78.0–100.0)
MONOS PCT: 8.4 % (ref 3.0–12.0)
Monocytes Absolute: 0.5 10*3/uL (ref 0.1–1.0)
Neutro Abs: 3.7 10*3/uL (ref 1.4–7.7)
Neutrophils Relative %: 58.3 % (ref 43.0–77.0)
PLATELETS: 211 10*3/uL (ref 150.0–400.0)
RBC: 4.64 Mil/uL (ref 4.22–5.81)
RDW: 15.7 % — ABNORMAL HIGH (ref 11.5–15.5)
WBC: 6.4 10*3/uL (ref 4.0–10.5)

## 2014-06-06 NOTE — Progress Notes (Signed)
Pre visit review using our clinic review tool, if applicable. No additional management support is needed unless otherwise documented below in the visit note.  Chief Complaint  Patient presents with  . Follow-up    medication    HPI: Austin Rose 81 y.o. comesi n for med check  Since last bisit no falling says doing ok. [facility] . Has spot on left neck to check . Neuropathy about the same Taking opiate one en am and one in evening  Adds extra or 12/ in night if needed for pain . Says can remember these meds and    Aware ness do sent accidentally take differently ROS: See pertinent postives and negatives per HPI. No cp sob syncope vision changes  Hx of pos tsh in past  Past Medical History  Diagnosis Date  . Arthritis   . CAD (coronary artery disease)   . Hypertension   . Hyperlipemia   . Hx of varicella   . Squamous cell skin cancer     pinna     Family History  Problem Relation Age of Onset  . Other Mother 61    brain aneursym  . Anuerysm Mother   . Heart attack Father     History   Social History  . Marital Status: Married    Spouse Name: [name]  . Number of Children: [name]  . Years of Education: [name]   Social History Main Topics  . Smoking status: Former Research scientist (life sciences)  . Smokeless tobacco: Current User    Types: Chew    Last Attempt to Quit: 04/15/1978  . Alcohol Use: No  . Drug Use: No  . Sexual Activity: No   Other Topics Concern  . 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   2 use tobacco no significant alcohol   No ets   Has fa                Outpatient Encounter Prescriptions as of 06/06/2014  Medication Sig  . Cyanocobalamin (B-12 PO) Take by mouth.  . ferrous sulfate 325 (65 FE) MG tablet Take 325 mg by mouth daily with breakfast.    . HYDROcodone-acetaminophen (NORCO) 10-325  MG per tablet TAKE  1 TABLET BY MOUTH as needed  Maximum EVERY 6 HOURS AS NEEDED FOR PAIN.  . midodrine (PROAMATINE) 5 MG tablet 1 tab  8AM, 1 PM and 1 at 6 PM- Pt gets thru the [place]  . NON FORMULARY 1 tablet. hydroflurocortisone .1 mg AM and midafternoon to prevent low blood pressure and dizziness- Pt gets thru the [place]   . ranitidine (ZANTAC) 150 MG tablet Take 150 mg by mouth 2 (two) times daily.    . simvastatin (ZOCOR) 40 MG tablet Take 40 mg by mouth. 1/2 tab daily- Gets thru the [place]    EXAM:  BP 106/64 mmHg  Temp(Src) 97.3 F (36.3 C) (Oral)  Ht 5' 6.75" (1.695 m)  Wt 166 lb 14.4 oz (75.705 kg)  BMI 26.35 kg/m2  Body mass index is 26.35 kg/(m^2).  GENERAL: vitals reviewed and listed above, alert, oriented, appears well hydrated and in no acute distress ambulatory non toxic looks well   Gait mildy flat footed   HEENT: atraumatic, conjunctiva  clear,  no obvious abnormalities on inspection of external nose and ears OP : no lesion edema or exudate  Tongue midline  NECK: no obvious masses on inspection palpation  LUNGS: clear to auscultation bilaterally, no wheezes, rales or rhonchi, good air movement CV: HRRR, no clubbing cyanosis onl cap refill  MS: moves all extremities without noticeable focal  Abnormality Skin left neck 2-mm verrucous like lesion white tan color  PSYCH: pleasant and cooperative, no obvious depression or anxiety Lab Results  Component Value Date   WBC 6.4 06/06/2014   HGB 13.6 06/06/2014   HCT 40.8 06/06/2014   PLT 211.0 06/06/2014   GLUCOSE 88 06/06/2014   CHOL 136 06/06/2014   TRIG 132.0 06/06/2014   HDL 51.80 06/06/2014   LDLCALC 58 06/06/2014   ALT 7 06/06/2014   AST 16 06/06/2014   NA 139 06/06/2014   K 4.5 06/06/2014   CL 107 06/06/2014   CREATININE 1.24 06/06/2014   BUN 17 06/06/2014   CO2 24 06/06/2014   TSH 2.62 06/06/2014   PSA 0.49 Test Methodology: [employer] PSA 07/18/2010   See labs from [place] last year  ASSESSMENT AND PLAN:  Discussed  the following assessment and plan:  Hereditary and idiopathic peripheral neuropathy - Plan: Basic metabolic panel, CBC with Differential/Platelet, Hepatic function panel, Lipid panel, TSH, T4, free  Medication management - Plan: Basic metabolic panel, CBC with Differential/Platelet, Hepatic function panel, Lipid panel, TSH, T4, free  Pain management - Plan: Basic metabolic panel, CBC with Differential/Platelet, Hepatic function panel, Lipid panel, TSH, T4, free  High risk medication use - Plan: Basic metabolic panel, CBC with Differential/Platelet, Hepatic function panel, Lipid panel, TSH, T4, free  Coronary artery disease due to lipid rich plaque - Plan: Basic metabolic panel, CBC with Differential/Platelet, Hepatic function panel, Lipid panel, TSH, T4, free  Primary osteoarthritis involving multiple joints - Plan: Basic metabolic panel, CBC with Differential/Platelet, Hepatic function panel, Lipid panel, TSH, T4, free  Abnormal TSH - Plan: Basic metabolic panel, CBC with Differential/Platelet, Hepatic function panel, Lipid panel, TSH, T4, free  Hyperlipidemia - Plan: Basic metabolic panel, CBC with Differential/Platelet, Hepatic function panel, Lipid panel, TSH, T4, free  Elevated TSH - Plan: Basic metabolic panel, CBC with Differential/Platelet, Hepatic function panel, Lipid panel, TSH, T4, free  Skin lesion  Need for vaccination with 13-polyvalent pneumococcal conjugate vaccine - Plan: Pneumococcal conjugate vaccine 13-valent Due for lab monitoring get labs today non fasting  Fu if needed otherwise in about 4 months or so  Get skina rea [facility])?  No new cv sx noted  reviewed risk benefit of opiates has been on these for a lont time and says helps him function  Better  I dont have other  Ideas for options to substitute at this time had se of gabapentin -Patient advised to return or notify health care team  if symptoms worsen ,persist or new concerns arise. Total visit 56mins >  50% spent counseling and coordinating care   Patient Instructions  continued caution  To avoid falling and injury . espiecally with the pain medication. Get [facility] lesion left neck   .   Will notify you  of labs when available. And get get the  [place] a copy also .  If all ok then can ROV in 4- 5 months or as needed    Mariann Laster K. Zeynab Klett M.D.  Lab Results  Component Value Date   WBC 6.4 06/06/2014   HGB 13.6 06/06/2014   HCT  40.8 06/06/2014   PLT 211.0 06/06/2014   GLUCOSE 88 06/06/2014   CHOL 136 06/06/2014   TRIG 132.0 06/06/2014   HDL 51.80 06/06/2014   LDLCALC 58 06/06/2014   ALT 7 06/06/2014   AST 16 06/06/2014   NA 139 06/06/2014   K 4.5 06/06/2014   CL 107 06/06/2014   CREATININE 1.24 06/06/2014   BUN 17 06/06/2014   CO2 24 06/06/2014   TSH 2.62 06/06/2014   PSA 0.49 Test Methodology: [employer] PSA 07/18/2010

## 2014-06-06 NOTE — Patient Instructions (Signed)
continued caution  To avoid falling and injury . espiecally with the pain medication. [facility] lesion left neck   .   Will notify you  of labs when available. And get get the  [place] a copy also .  If all ok then can ROV in 4- 5 months or as needed

## 2014-06-07 DIAGNOSIS — E785 Hyperlipidemia, unspecified: Secondary | ICD-10-CM | POA: Insufficient documentation

## 2014-06-07 LAB — BASIC METABOLIC PANEL
BUN: 17 mg/dL (ref 6–23)
CALCIUM: 9.7 mg/dL (ref 8.4–10.5)
CO2: 24 meq/L (ref 19–32)
CREATININE: 1.24 mg/dL (ref 0.40–1.50)
Chloride: 107 mEq/L (ref 96–112)
GFR: 58.63 mL/min — ABNORMAL LOW (ref >60.00)
Glucose, Bld: 88 mg/dL (ref 70–99)
Potassium: 4.5 mEq/L (ref 3.5–5.1)
SODIUM: 139 meq/L (ref 135–145)

## 2014-06-07 LAB — LIPID PANEL
Cholesterol: 136 mg/dL (ref 0–200)
HDL: 51.8 mg/dL (ref >39.00)
LDL Cholesterol: 58 mg/dL (ref 0–99)
NONHDL: 84.2
Total CHOL/HDL Ratio: 3
Triglycerides: 132 mg/dL (ref 0.0–149.0)
VLDL: 26.4 mg/dL (ref 0.0–40.0)

## 2014-06-07 LAB — HEPATIC FUNCTION PANEL
ALT: 7 U/L (ref 0–53)
AST: 16 U/L (ref 0–37)
Albumin: 4 g/dL (ref 3.5–5.2)
Alkaline Phosphatase: 71 U/L (ref 39–117)
Bilirubin, Direct: 0.1 mg/dL (ref 0.0–0.3)
Total Bilirubin: 0.5 mg/dL (ref 0.2–1.2)
Total Protein: 6.9 g/dL (ref 6.0–8.3)

## 2014-06-20 ENCOUNTER — Telehealth: Payer: Self-pay | Admitting: Internal Medicine

## 2014-06-20 NOTE — Telephone Encounter (Signed)
Pt request refill HYDROcodone-acetaminophen (NORCO) 10-325 MG per tablet Advised pt to call back on friday

## 2014-06-21 NOTE — Telephone Encounter (Signed)
Ok to rx on march 11  I will be out of town next week.

## 2014-06-22 ENCOUNTER — Telehealth: Payer: Self-pay | Admitting: Internal Medicine

## 2014-06-22 NOTE — Telephone Encounter (Signed)
Pt notified to pick up his prescription on 06/24/14

## 2014-06-22 NOTE — Telephone Encounter (Signed)
Pt needs new hydrocodone °

## 2014-06-22 NOTE — Telephone Encounter (Signed)
Pt notified to pick up his rx on 06/24/14

## 2014-06-24 MED ORDER — HYDROCODONE-ACETAMINOPHEN 10-325 MG PO TABS
ORAL_TABLET | ORAL | Status: DC
Start: 2014-06-24 — End: 2014-07-19

## 2014-06-24 NOTE — Telephone Encounter (Signed)
Pt picked up at front desk.

## 2014-07-18 ENCOUNTER — Telehealth: Payer: Self-pay | Admitting: Internal Medicine

## 2014-07-18 NOTE — Telephone Encounter (Signed)
Pt request refill HYDROcodone-acetaminophen (NORCO) 10-325 MG per tablet °

## 2014-07-18 NOTE — Telephone Encounter (Signed)
Ok to refill x 2   disp 90 each.

## 2014-07-19 MED ORDER — HYDROCODONE-ACETAMINOPHEN 10-325 MG PO TABS: ORAL_TABLET | ORAL | Status: DC

## 2014-07-19 NOTE — Telephone Encounter (Signed)
Refilled X 1  Pt notified to pick up rx at the front desk.

## 2014-08-09 ENCOUNTER — Telehealth: Payer: Self-pay | Admitting: Internal Medicine

## 2014-08-09 NOTE — Telephone Encounter (Signed)
Pt request refill HYDROcodone-acetaminophen (NORCO) 10-325 MG per tablet °

## 2014-08-10 MED ORDER — HYDROCODONE-ACETAMINOPHEN 10-325 MG PO TABS: ORAL_TABLET | ORAL | Status: DC

## 2014-08-10 NOTE — Telephone Encounter (Signed)
Left message notifying pt to pick up at the front desk.

## 2014-08-10 NOTE — Telephone Encounter (Signed)
okx 1

## 2014-08-30 ENCOUNTER — Telehealth: Payer: Self-pay | Admitting: Internal Medicine

## 2014-08-30 NOTE — Telephone Encounter (Signed)
Pt needs new rx hydrocodone °

## 2014-08-30 NOTE — Telephone Encounter (Signed)
Last OV 06/06/14. Last filled 08/10/14.  Please send back to [facility]'s basket

## 2014-08-30 NOTE — Telephone Encounter (Signed)
Can refill next week but i wont be  In office  Then . Can rx on Friday.  5 20

## 2014-09-02 MED ORDER — HYDROCODONE-ACETAMINOPHEN 10-325 MG PO TABS: ORAL_TABLET | ORAL | Status: DC

## 2014-09-02 NOTE — Telephone Encounter (Signed)
Pt notified to pick up at the front desk. 

## 2014-09-04 DIAGNOSIS — R079 Chest pain, unspecified: Secondary | ICD-10-CM | POA: Diagnosis not present

## 2014-09-20 ENCOUNTER — Encounter: Payer: Self-pay | Admitting: Internal Medicine

## 2014-09-20 ENCOUNTER — Ambulatory Visit (INDEPENDENT_AMBULATORY_CARE_PROVIDER_SITE_OTHER): Payer: Commercial Managed Care - HMO | Admitting: Internal Medicine

## 2014-09-20 VITALS — BP 120/80 | HR 68 | Temp 98.0°F | Wt 169.0 lb

## 2014-09-20 DIAGNOSIS — Z5189 Encounter for other specified aftercare: Secondary | ICD-10-CM

## 2014-09-20 DIAGNOSIS — G609 Hereditary and idiopathic neuropathy, unspecified: Secondary | ICD-10-CM

## 2014-09-20 DIAGNOSIS — R54 Age-related physical debility: Secondary | ICD-10-CM

## 2014-09-20 DIAGNOSIS — Z79899 Other long term (current) drug therapy: Secondary | ICD-10-CM

## 2014-09-20 DIAGNOSIS — R233 Spontaneous ecchymoses: Secondary | ICD-10-CM

## 2014-09-20 DIAGNOSIS — IMO0001 Reserved for inherently not codable concepts without codable children: Secondary | ICD-10-CM

## 2014-09-20 DIAGNOSIS — Z79891 Long term (current) use of opiate analgesic: Secondary | ICD-10-CM | POA: Diagnosis not present

## 2014-09-20 DIAGNOSIS — R52 Pain, unspecified: Secondary | ICD-10-CM

## 2014-09-20 MED ORDER — HYDROCODONE-ACETAMINOPHEN 10-325 MG PO TABS: ORAL_TABLET | ORAL | Status: DC

## 2014-09-20 NOTE — Progress Notes (Signed)
Pre visit review using our clinic review tool, if applicable. No additional management support is needed unless otherwise documented below in the visit note. 

## 2014-09-20 NOTE — Patient Instructions (Signed)
Continue caution with medication for pain .seems to be helpful for you. contkinue paying attention  To avoid falls.  Continue regular feet check   Plan ROV and med check in another 4 months .

## 2014-09-20 NOTE — Progress Notes (Signed)
Chief Complaint  Patient presents with  . Follow-up    medication    HPI: Austin Rose 81 y.o.  comes in for chronic disease/ medication management  He gets his pain medication from os for his painful legs and residual back arthritis. He reports that the pain medicine which is hydrocodone 325 acetaminophen is helping him stay active. He takes 1 in the morning and 1 in the evening and one or 2 in the day depending on his pain level. 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 He feels good when he is working in the garden and doing these things he doesn't get up on a ladder he has no recent falls. He watches his feet and there are no infections or ulcers. He has used Vicks vapor rub on his thickened nails.  He usually gets his labs and checkup at the [place] doesn't know when his next check gets. ROS: See pertinent positives and negatives per HPI. Denies any syncope current chest pain shortness of breath. Gets a lot of skin bruising when he hits things has been treated for skin cancer no recent ones. Does get a skin check.  Past Medical History  Diagnosis Date  . Arthritis   . CAD (coronary artery disease)   . Hypertension   . Hyperlipemia   . Hx of varicella   . Squamous cell skin cancer     pinna     Family History  Problem Relation Age of Onset  . Other Mother 48    brain aneursym  . Anuerysm Mother   . Heart attack Father     History   Social History  . Marital Status: Married    Spouse Name: [name]  . Number of Children: [name]  . Years of Education: [name]   Social History Main Topics  . Smoking status: Former Research scientist (life sciences)  . Smokeless tobacco: Current User    Types: Chew    Last Attempt to Quit: 04/15/1978  . Alcohol Use: No  . Drug Use: No  . Sexual Activity: No   Other Topics Concern  . 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   2 use tobacco no significant alcohol   No ets   Has fa                Outpatient Prescriptions Prior to Visit  Medication Sig Dispense Refill  . Cyanocobalamin (B-12 PO) Take by mouth.    . ferrous sulfate 325 (65 FE) MG tablet Take 325 mg by mouth daily with breakfast.      . midodrine (PROAMATINE) 5 MG tablet 1 tab  8AM, 1 PM and 1 at 6 PM- Pt gets thru the [place]    . NON FORMULARY 1 tablet. hydroflurocortisone .1 mg AM and midafternoon to prevent low blood pressure and dizziness- Pt gets thru the [place]     . simvastatin (ZOCOR) 40 MG tablet Take 40 mg by mouth. 1/2 tab daily- Gets thru the [place]    . HYDROcodone-acetaminophen (NORCO) 10-325 MG per tablet TAKE  1 TABLET BY MOUTH as needed  Maximum EVERY 6 HOURS AS NEEDED FOR PAIN. 90 tablet 0  . ranitidine (ZANTAC) 150 MG tablet Take 150 mg by mouth 2 (two) times daily.  No facility-administered medications prior to visit.     EXAM:  BP 120/80 mmHg  Pulse 68  Temp(Src) 98 F (36.7 C) (Oral)  Wt 169 lb (76.658 kg)  SpO2 96%  Body mass index is 26.68 kg/(m^2).  GENERAL: vitals reviewed and listed above, alert, oriented, appears well hydrated and in no acute distress HEENT: atraumatic, conjunctiva  clear, no obvious abnormalities on inspection of external nose and earsNECK: no obvious masses on inspection palpation  LUNGS: clear to auscultation bilaterally, no wheezes, rales or rhonchi, good air movement CV: HRRR, no clubbing cyanosis or  peripheral edema nl cap refill  MS: moves all extremities without noticeable focal  Abnormality ambulatory reasonable balance walks slowly oriented and normal speech. Skin shows numerous actinic changes and some senile ecchymosis PSYCH: pleasant and cooperative, no obvious depression or anxiety BP Readings from Last 3 Encounters:  09/20/14 120/80  06/06/14 106/64  01/22/14 91/57   Wt Readings from Last 3 Encounters:  09/20/14 169 lb (76.658 kg)  06/06/14 166  lb 14.4 oz (75.705 kg)  01/20/14 171 lb 3.2 oz (77.656 kg)    ASSESSMENT AND PLAN:  Discussed the following assessment and plan:  Hereditary and idiopathic peripheral neuropathy  Medication management  Pain management  Senile ecchymosis - Not pathologic  High risk medication use  Age factor Due for tox screen to be done today reviewed management medication benefit more than risk still again he states that the hydrocodone when he takes it at night helps his right leg restlessness.. Of note we had tried gabapentin in the past and he had side effects. Patient i reports awareness of fall prevention concentration avoidance from distraction. -Patient advised to return or notify health care team  if symptoms worsen ,persist or new concerns arise. Total visit 81mins > 50% spent counseling and coordinating care as indicated in above note and in instructions to patient .      Patient Instructions  Continue caution with medication for pain .seems to be helpful for you. contkinue paying attention  To avoid falls.  Continue regular feet check   Plan ROV and med check in another 4 months .     Standley Brooking. Bodi Palmeri M.D.

## 2014-10-05 ENCOUNTER — Encounter: Payer: Self-pay | Admitting: Internal Medicine

## 2014-10-11 ENCOUNTER — Telehealth: Payer: Self-pay | Admitting: Internal Medicine

## 2014-10-11 NOTE — Telephone Encounter (Signed)
Pt request refill of the following: HYDROcodone-acetaminophen (NORCO) 10-325 MG per tablet ° ° °Phamacy: ° °

## 2014-10-13 MED ORDER — HYDROCODONE-ACETAMINOPHEN 10-325 MG PO TABS: ORAL_TABLET | ORAL | Status: DC

## 2014-10-13 NOTE — Telephone Encounter (Signed)
Ok to refill x 1  

## 2014-10-13 NOTE — Telephone Encounter (Signed)
Pt notified to pick up at the front desk. 

## 2014-10-13 NOTE — Telephone Encounter (Signed)
Pt is out of med

## 2014-11-02 ENCOUNTER — Telehealth: Payer: Self-pay | Admitting: Internal Medicine

## 2014-11-02 NOTE — Telephone Encounter (Signed)
Can refill x 1 

## 2014-11-02 NOTE — Telephone Encounter (Signed)
Pt need new hydrocodone °

## 2014-11-03 MED ORDER — HYDROCODONE-ACETAMINOPHEN 10-325 MG PO TABS: ORAL_TABLET | ORAL | Status: DC

## 2014-11-03 NOTE — Telephone Encounter (Signed)
Pt's wife notified to pick up at the front desk. 

## 2014-11-15 ENCOUNTER — Encounter: Payer: Self-pay | Admitting: *Deleted

## 2014-11-23 ENCOUNTER — Telehealth: Payer: Self-pay | Admitting: Internal Medicine

## 2014-11-23 MED ORDER — HYDROCODONE-ACETAMINOPHEN 10-325 MG PO TABS: ORAL_TABLET | ORAL | Status: DC

## 2014-11-23 NOTE — Telephone Encounter (Signed)
Patient called in for RX refill on hydrocodone

## 2014-11-23 NOTE — Telephone Encounter (Signed)
OK to refill for one month 

## 2014-11-23 NOTE — Telephone Encounter (Signed)
Pt notified to pick up at the front desk. 

## 2014-12-09 ENCOUNTER — Encounter: Payer: Self-pay | Admitting: Internal Medicine

## 2014-12-09 ENCOUNTER — Encounter: Payer: Self-pay | Admitting: Cardiovascular Disease

## 2014-12-13 ENCOUNTER — Telehealth: Payer: Self-pay | Admitting: Internal Medicine

## 2014-12-13 MED ORDER — HYDROCODONE-ACETAMINOPHEN 10-325 MG PO TABS: ORAL_TABLET | ORAL | Status: DC

## 2014-12-13 NOTE — Telephone Encounter (Signed)
Ok to refill x 1  

## 2014-12-13 NOTE — Telephone Encounter (Signed)
Pt notified to pick up at the front desk. 

## 2014-12-13 NOTE — Telephone Encounter (Signed)
Pt request refill HYDROcodone-acetaminophen (NORCO) 10-325 MG per tablet °

## 2015-01-02 ENCOUNTER — Telehealth: Payer: Self-pay | Admitting: Internal Medicine

## 2015-01-02 MED ORDER — HYDROCODONE-ACETAMINOPHEN 10-325 MG PO TABS: ORAL_TABLET | ORAL | Status: DC

## 2015-01-02 NOTE — Telephone Encounter (Signed)
Patient request refill on HYDROcodone-acetaminophen (NORCO) 10-325 MG per tablet

## 2015-01-02 NOTE — Telephone Encounter (Signed)
Pt notified to pick up at the front desk morning on 01/03/15.

## 2015-01-02 NOTE — Telephone Encounter (Signed)
Refill x1 

## 2015-01-20 ENCOUNTER — Ambulatory Visit: Payer: Commercial Managed Care - HMO | Admitting: Internal Medicine

## 2015-01-23 ENCOUNTER — Telehealth: Payer: Self-pay | Admitting: Internal Medicine

## 2015-01-23 MED ORDER — HYDROCODONE-ACETAMINOPHEN 10-325 MG PO TABS: ORAL_TABLET | ORAL | Status: DC

## 2015-01-23 NOTE — Telephone Encounter (Signed)
Ok to refill x 1  

## 2015-01-23 NOTE — Telephone Encounter (Signed)
Pt notified to pick up at the front desk. 

## 2015-01-23 NOTE — Telephone Encounter (Addendum)
Pt request refill HYDROcodone-acetaminophen (NORCO) 10-325 MG per tablet   FYI:  Pt resc 10/7 appointment to thurs at 10:15am

## 2015-01-26 ENCOUNTER — Encounter: Payer: Self-pay | Admitting: Internal Medicine

## 2015-01-26 ENCOUNTER — Ambulatory Visit (INDEPENDENT_AMBULATORY_CARE_PROVIDER_SITE_OTHER): Payer: Commercial Managed Care - HMO | Admitting: Internal Medicine

## 2015-01-26 VITALS — BP 122/70 | Temp 97.7°F | Ht 66.75 in | Wt 164.4 lb

## 2015-01-26 DIAGNOSIS — Z5189 Encounter for other specified aftercare: Secondary | ICD-10-CM | POA: Diagnosis not present

## 2015-01-26 DIAGNOSIS — Z79899 Other long term (current) drug therapy: Secondary | ICD-10-CM | POA: Diagnosis not present

## 2015-01-26 DIAGNOSIS — H8110 Benign paroxysmal vertigo, unspecified ear: Secondary | ICD-10-CM | POA: Diagnosis not present

## 2015-01-26 DIAGNOSIS — IMO0001 Reserved for inherently not codable concepts without codable children: Secondary | ICD-10-CM

## 2015-01-26 DIAGNOSIS — G609 Hereditary and idiopathic neuropathy, unspecified: Secondary | ICD-10-CM

## 2015-01-26 DIAGNOSIS — R54 Age-related physical debility: Secondary | ICD-10-CM

## 2015-01-26 DIAGNOSIS — R52 Pain, unspecified: Secondary | ICD-10-CM

## 2015-01-26 NOTE — Progress Notes (Signed)
Pre visit review using our clinic review tool, if applicable. No additional management support is needed unless otherwise documented below in the visit note.  Chief Complaint  Patient presents with  . Follow-up    meds  had episodes of dizzy    HPI: Austin Rose 81 y.o.  comes in for chronic disease/ medication management  He has djd back disease and  Peripheral neuropathy . He has  Taken long term opiates for years managed byy his previous PCP and continued   Because he  that reports help him function on a daily basesi and otehrwise hard to get anything g done or  Get out o =bed. He has stab;e vascular disease and gets  Care and blood tests via the [facility]     He takes the medicine and denies any significant side effects. Walks has a cane no falling still paints avoids getting on ladders. The medicine is mostly for the pain in his feet neuropathy doesn't really help his back that much. Asks about some nocturnal incontinence that comes and goes sometimes worse when he drinks a lot of water at night denies any major changes in his stream Using depends at night  comes nad oges   Not in day. Not really interested in seeing urology no blood. Doesn't happen all the time.  Recently has had daily dizziness when he gets up from bed. Last a few seconds and then goes away. It occurs when he lays down and sits up but not when sitting to standing. He is fine the rest of the day. No major vision changes or neurologic changes. ROS: See pertinent positives and negatives per HPI.  Past Medical History  Diagnosis Date  . Arthritis   . CAD (coronary artery disease)   . Hypertension   . Hyperlipemia   . Hx of varicella   . Squamous cell skin cancer     pinna   . Hx of echocardiogram 07/20/2010    The cavity size was normal, there is mild aortic stenosis,the aortic root was normal is size, the ascending aorta was normal in size.  [name] History of stress test 09/20/2008    Family History  Problem Relation  Age of Onset  . Other Mother 53    brain aneursym  . Anuerysm Mother   . Heart attack Father     Social History   Social History  . Marital Status: Married    Spouse Name: [name]  . Number of Children: [name]  . Years of Education: [name]   Social History Main Topics  . Smoking status: Former Research scientist (life sciences)  . Smokeless tobacco: Current User    Types: Chew    Last Attempt to Quit: 04/15/1978  . Alcohol Use: No  . Drug Use: No  . Sexual Activity: No   Other Topics Concern  . 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   2 use tobacco no significant alcohol   No ets   Has fa                Outpatient Prescriptions Prior to Visit  Medication Sig Dispense Refill  . Cyanocobalamin (B-12 PO) Take by mouth.    . ferrous sulfate 325 (65 FE) MG tablet Take 325 mg by mouth daily with breakfast.      .  HYDROcodone-acetaminophen (NORCO) 10-325 MG tablet TAKE  1 TABLET BY MOUTH as needed  Maximum EVERY 6 HOURS AS NEEDED FOR PAIN. 90 tablet 0  . midodrine (PROAMATINE) 5 MG tablet 1 tab  8AM, 1 PM and 1 at 6 PM- Pt gets thru the [place]    . NON FORMULARY 1 tablet. hydroflurocortisone .1 mg AM and midafternoon to prevent low blood pressure and dizziness- Pt gets thru the [place]     . ranitidine (ZANTAC) 150 MG tablet Take 150 mg by mouth 2 (two) times daily.      . simvastatin (ZOCOR) 40 MG tablet Take 40 mg by mouth. 1/2 tab daily- Gets thru the [place]     No facility-administered medications prior to visit.     EXAM:  BP 122/70 mmHg  Temp(Src) 97.7 F (36.5 C) (Oral)  Ht 5' 6.75" (1.695 m)  Wt 164 lb 6.4 oz (74.571 kg)  BMI 25.96 kg/m2  Body mass index is 25.96 kg/(m^2).  GENERAL: vitals reviewed and listed above, alert, oriented, appears well hydrated and in no acute distress walks independently with a cane able to get up on the table HEENT: atraumatic,  conjunctiva  clear, no obvious abnormalities on inspection of external nose and ears TMs are clear EOMs are full when he goes from laying to sitting he gets vertigo stops after 5-10 seconds also occurs when he shakes his head really fast NECK: no obvious masses on inspection palpation  LUNGS: clear to auscultation bilaterally, no wheezes, rales or rhonchi, CV: HRRR, no clubbing cyanosis or  peripheral edema nl cap refill  MS: moves all extremities PSYCH: pleasant and cooperative, no obvious depression or anxiety Lab Results  Component Value Date   WBC 6.4 06/06/2014   HGB 13.6 06/06/2014   HCT 40.8 06/06/2014   PLT 211.0 06/06/2014   GLUCOSE 88 06/06/2014   CHOL 136 06/06/2014   TRIG 132.0 06/06/2014   HDL 51.80 06/06/2014   LDLCALC 58 06/06/2014   ALT 7 06/06/2014   AST 16 06/06/2014   NA 139 06/06/2014   K 4.5 06/06/2014   CL 107 06/06/2014   CREATININE 1.24 06/06/2014   BUN 17 06/06/2014   CO2 24 06/06/2014   TSH 2.62 06/06/2014   PSA 0.49 Test Methodology: [employer] PSA 07/18/2010   BP Readings from Last 3 Encounters:  01/26/15 122/70  09/20/14 120/80  06/06/14 106/64   Wt Readings from Last 3 Encounters:  01/26/15 164 lb 6.4 oz (74.571 kg)  09/20/14 169 lb (76.658 kg)  06/06/14 166 lb 14.4 oz (75.705 kg)    ASSESSMENT AND PLAN:  Discussed the following assessment and plan:  Hereditary and idiopathic peripheral neuropathy  Medication management  Pain management  Benign paroxysmal positional vertigo, unspecified laterality - See text work on this consider physical therapy for ongoing doesn't occur when he is upright.  Age factor Benefit still more than risk okay to refill his opiates. He is due for labs in the spring plan wellness visit with labs of hasn't been done at the [place]. -Patient advised to return or notify health care team  if symptoms worsen ,persist or new concerns arise. Total visit 29mins > 50% spent counseling and coordinating care as indicated in  above note and in instructions to patient .   Patient Instructions  dizziiness sounds like  Benign  Positional vertigo   If  continuing and there is   Physical therapy for this .  Let us know .  Wellness visit    In  6 months  February     Or march 2017 .  Refill medication for your pain   neuropathy .     Benign Positional Vertigo Vertigo is the feeling that you or your surroundings are moving when they are not. Benign positional vertigo is the most common form of vertigo. The cause of this condition is not serious (is benign). This condition is triggered by certain movements and positions (is positional). This condition can be dangerous if it occurs while you are doing something that could endanger you or others, such as driving.  CAUSES In many cases, the cause of this condition is not known. It may be caused by a disturbance in an area of the inner ear that helps your brain to sense movement and balance. This disturbance can be caused by a viral infection (labyrinthitis), head injury, or repetitive motion. RISK FACTORS This condition is more likely to develop in:  Women.  People who are 51 years of age or older. SYMPTOMS Symptoms of this condition usually happen when you move your head or your eyes in different directions. Symptoms may start suddenly, and they usually last for less than a minute. Symptoms may include:  Loss of balance and falling.  Feeling like you are spinning or moving.  Feeling like your surroundings are spinning or moving.  Nausea and vomiting.  Blurred vision.  Dizziness.  Involuntary eye movement (nystagmus). Symptoms can be mild and cause only slight annoyance, or they can be severe and interfere with daily life. Episodes of benign positional vertigo may return (recur) over time, and they may be triggered by certain movements. Symptoms may improve over time. DIAGNOSIS This condition is usually diagnosed by medical history and a physical exam of the  head, neck, and ears. You may be referred to a health care provider who specializes in ear, nose, and throat (ENT) problems (otolaryngologist) or a provider who specializes in disorders of the nervous system (neurologist). You may have additional testing, including:  MRI.  A CT scan.  Eye movement tests. Your health care provider may ask you to change positions quickly while he or she watches you for symptoms of benign positional vertigo, such as nystagmus. Eye movement may be tested with an electronystagmogram (ENG), caloric stimulation, the Dix-Hallpike test, or the roll test.  An electroencephalogram (EEG). This records electrical activity in your brain.  Hearing tests. TREATMENT Usually, your health care provider will treat this by moving your head in specific positions to adjust your inner ear back to normal. Surgery may be needed in severe cases, but this is rare. In some cases, benign positional vertigo may resolve on its own in 2-4 weeks. HOME CARE INSTRUCTIONS Safety  Move slowly.Avoid sudden body or head movements.  Avoid driving.  Avoid operating heavy machinery.  Avoid doing any tasks that would be dangerous to you or others if a vertigo episode would occur.  If you have trouble walking or keeping your balance, try using a cane for stability. If you feel dizzy or unstable, sit down right away.  Return to your normal activities as told by your health care provider. Ask your health care provider what activities are safe for you. General Instructions  Take over-the-counter and prescription medicines only as told by your health care provider.  Avoid certain positions or movements as told by your health care provider.  Drink enough fluid to keep your urine clear or pale yellow.  Keep all follow-up visits as told by your health care provider. This  is important. SEEK MEDICAL CARE IF:  You have a fever.  Your condition gets worse or you develop new symptoms.  Your  family or friends notice any behavioral changes.  Your nausea or vomiting gets worse.  You have numbness or a "pins and needles" sensation. SEEK IMMEDIATE MEDICAL CARE IF:  You have difficulty speaking or moving.  You are always dizzy.  You faint.  You develop severe headaches.  You have weakness in your legs or arms.  You have changes in your hearing or vision.  You develop a stiff neck.  You develop sensitivity to light.   This information is not intended to replace advice given to you by your health care provider. Make sure you discuss any questions you have with your health care provider.   Document Released: 01/07/2006 Document Revised: 12/21/2014 Document Reviewed: 07/25/2014 Elsevier Interactive Patient Education 2016 [place] K. Panosh M.D. Health Maintenance Due  Topic Date Due  . TETANUS/TDAP  07/14/2013

## 2015-01-26 NOTE — Patient Instructions (Signed)
dizziiness sounds like  Benign  Positional vertigo   If  continuing and there is   Physical therapy for this .  Let us know .  Wellness visit    In  6 months  February     Or march 2017 .  Refill medication for your pain   neuropathy .     Benign Positional Vertigo Vertigo is the feeling that you or your surroundings are moving when they are not. Benign positional vertigo is the most common form of vertigo. The cause of this condition is not serious (is benign). This condition is triggered by certain movements and positions (is positional). This condition can be dangerous if it occurs while you are doing something that could endanger you or others, such as driving.  CAUSES In many cases, the cause of this condition is not known. It may be caused by a disturbance in an area of the inner ear that helps your brain to sense movement and balance. This disturbance can be caused by a viral infection (labyrinthitis), head injury, or repetitive motion. RISK FACTORS This condition is more likely to develop in:  Women.  People who are 79 years of age or older. SYMPTOMS Symptoms of this condition usually happen when you move your head or your eyes in different directions. Symptoms may start suddenly, and they usually last for less than a minute. Symptoms may include:  Loss of balance and falling.  Feeling like you are spinning or moving.  Feeling like your surroundings are spinning or moving.  Nausea and vomiting.  Blurred vision.  Dizziness.  Involuntary eye movement (nystagmus). Symptoms can be mild and cause only slight annoyance, or they can be severe and interfere with daily life. Episodes of benign positional vertigo may return (recur) over time, and they may be triggered by certain movements. Symptoms may improve over time. DIAGNOSIS This condition is usually diagnosed by medical history and a physical exam of the head, neck, and ears. You may be referred to a health care provider who  specializes in ear, nose, and throat (ENT) problems (otolaryngologist) or a provider who specializes in disorders of the nervous system (neurologist). You may have additional testing, including:  MRI.  A CT scan.  Eye movement tests. Your health care provider may ask you to change positions quickly while he or she watches you for symptoms of benign positional vertigo, such as nystagmus. Eye movement may be tested with an electronystagmogram (ENG), caloric stimulation, the Dix-Hallpike test, or the roll test.  An electroencephalogram (EEG). This records electrical activity in your brain.  Hearing tests. TREATMENT Usually, your health care provider will treat this by moving your head in specific positions to adjust your inner ear back to normal. Surgery may be needed in severe cases, but this is rare. In some cases, benign positional vertigo may resolve on its own in 2-4 weeks. HOME CARE INSTRUCTIONS Safety  Move slowly.Avoid sudden body or head movements.  Avoid driving.  Avoid operating heavy machinery.  Avoid doing any tasks that would be dangerous to you or others if a vertigo episode would occur.  If you have trouble walking or keeping your balance, try using a cane for stability. If you feel dizzy or unstable, sit down right away.  Return to your normal activities as told by your health care provider. Ask your health care provider what activities are safe for you. General Instructions  Take over-the-counter and prescription medicines only as told by your health care provider.  Avoid  certain positions or movements as told by your health care provider.  Drink enough fluid to keep your urine clear or pale yellow.  Keep all follow-up visits as told by your health care provider. This is important. SEEK MEDICAL CARE IF:  You have a fever.  Your condition gets worse or you develop new symptoms.  Your family or friends notice any behavioral changes.  Your nausea or vomiting  gets worse.  You have numbness or a "pins and needles" sensation. SEEK IMMEDIATE MEDICAL CARE IF:  You have difficulty speaking or moving.  You are always dizzy.  You faint.  You develop severe headaches.  You have weakness in your legs or arms.  You have changes in your hearing or vision.  You develop a stiff neck.  You develop sensitivity to light.   This information is not intended to replace advice given to you by your health care provider. Make sure you discuss any questions you have with your health care provider.   Document Released: 01/07/2006 Document Revised: 12/21/2014 Document Reviewed: 07/25/2014 Elsevier Interactive Patient Education [employer].

## 2015-02-13 ENCOUNTER — Telehealth: Payer: Self-pay | Admitting: Internal Medicine

## 2015-02-13 MED ORDER — HYDROCODONE-ACETAMINOPHEN 10-325 MG PO TABS: ORAL_TABLET | ORAL | Status: DC

## 2015-02-13 NOTE — Telephone Encounter (Signed)
° ° °  Pt request refill of the following: ° ° °HYDROcodone-acetaminophen (NORCO) 10-325 MG tablet ° ° °Phamacy: °

## 2015-02-13 NOTE — Telephone Encounter (Signed)
Ok to refill   X 2 separate rx  Of   90

## 2015-02-13 NOTE — Telephone Encounter (Signed)
Patient's wife notified to pick up at the front desk.

## 2015-03-23 ENCOUNTER — Telehealth: Payer: Self-pay | Admitting: Internal Medicine

## 2015-03-23 NOTE — Telephone Encounter (Signed)
° ° °  Pt request refill of the following: ° ° °HYDROcodone-acetaminophen (NORCO) 10-325 MG tablet ° ° °Phamacy: °

## 2015-03-23 NOTE — Telephone Encounter (Signed)
Ok x 1

## 2015-03-24 MED ORDER — HYDROCODONE-ACETAMINOPHEN 10-325 MG PO TABS: ORAL_TABLET | ORAL | Status: DC

## 2015-03-24 NOTE — Telephone Encounter (Signed)
Pt notified to pick up rx at the front desk.

## 2015-03-30 ENCOUNTER — Encounter: Payer: Self-pay | Admitting: Gastroenterology

## 2015-04-11 ENCOUNTER — Telehealth: Payer: Self-pay | Admitting: Internal Medicine

## 2015-04-11 NOTE — Telephone Encounter (Signed)
Pt needs new rx hydrocodone °

## 2015-04-13 NOTE — Telephone Encounter (Signed)
Pt called back saying he needs a Rx for Hydrocodone. Please give him a call if needed.  Pt's ph# [phone] Thank you.

## 2015-04-13 NOTE — Telephone Encounter (Signed)
Last  rx 12 9  Ok to refill x 1

## 2015-04-14 MED ORDER — HYDROCODONE-ACETAMINOPHEN 10-325 MG PO TABS: ORAL_TABLET | ORAL | Status: DC

## 2015-04-14 NOTE — Telephone Encounter (Signed)
Pt's wife notified to pick up at the front desk.  Also notified the office will be closed on Monday for New Years.

## 2015-05-04 ENCOUNTER — Telehealth: Payer: Self-pay | Admitting: Internal Medicine

## 2015-05-04 MED ORDER — HYDROCODONE-ACETAMINOPHEN 10-325 MG PO TABS: ORAL_TABLET | ORAL | Status: DC

## 2015-05-04 NOTE — Telephone Encounter (Signed)
Pt needs new rx hydrocodone °

## 2015-05-04 NOTE — Telephone Encounter (Signed)
Left a message on machine for the pt to pick up prescription at the front desk.

## 2015-05-04 NOTE — Telephone Encounter (Signed)
Refill x1 

## 2015-05-15 ENCOUNTER — Ambulatory Visit (INDEPENDENT_AMBULATORY_CARE_PROVIDER_SITE_OTHER): Payer: Commercial Managed Care - HMO | Admitting: Internal Medicine

## 2015-05-15 ENCOUNTER — Encounter: Payer: Self-pay | Admitting: Internal Medicine

## 2015-05-15 VITALS — BP 140/72 | Temp 97.9°F | Ht 66.75 in | Wt 169.5 lb

## 2015-05-15 DIAGNOSIS — N3944 Nocturnal enuresis: Secondary | ICD-10-CM | POA: Diagnosis not present

## 2015-05-15 DIAGNOSIS — R300 Dysuria: Secondary | ICD-10-CM | POA: Diagnosis not present

## 2015-05-15 DIAGNOSIS — R32 Unspecified urinary incontinence: Secondary | ICD-10-CM

## 2015-05-15 LAB — POCT URINALYSIS DIPSTICK
Bilirubin, UA: NEGATIVE
Glucose, UA: NEGATIVE
Ketones, UA: NEGATIVE
Leukocytes, UA: NEGATIVE
NITRITE UA: NEGATIVE
PROTEIN UA: NEGATIVE
RBC UA: NEGATIVE
SPEC GRAV UA: 1.02
UROBILINOGEN UA: 1
pH, UA: 7

## 2015-05-15 NOTE — Progress Notes (Signed)
Pre visit review using our clinic review tool, if applicable. No additional management support is needed unless otherwise documented below in the visit note.  Chief Complaint  Patient presents with  . Urinary Incontinence    2-3 months    HPI: Patient Austin Rose  comes in today for SDA for   problem evaluation.  Ongoing  For  2 months and   Not getting better  No fever chills hematuria   Nocturnal enuresis    Wetting    Depends .  Once in a while wakes up.   No fallin g.     No  weakness in legs.  Weakened  stream  Recently .   ? [facility] and [facility]'t [facility]  Was flet to be dehydrated with drop in bp when standing . Doing better   Doesn't wake up when wets the bed at night having to use depends a lot  . Never had this before  . No change legs . Other health status.   ROS: See pertinent positives and negatives per HPI. No fever chills  ocass consitpation    Past Medical History  Diagnosis Date  . Arthritis   . CAD (coronary artery disease)   . Hypertension   . Hyperlipemia   . Hx of varicella   . Squamous cell skin cancer     pinna   . Hx of echocardiogram 07/20/2010    The cavity size was normal, there is mild aortic stenosis,the aortic root was normal is size, the ascending aorta was normal in size.  [name] History of stress test 09/20/2008    Family History  Problem Relation Age of Onset  . Other Mother 35    brain aneursym  . Anuerysm Mother   . Heart attack Father     Social History   Social History  . Marital Status: Married    Spouse Name: [name]  . Number of Children: [name]  . Years of Education: [name]   Social History Main Topics  . Smoking status: Former Research scientist (life sciences)  . Smokeless tobacco: Current User    Types: Chew    Last Attempt to Quit: 04/15/1978  . Alcohol Use: No  . Drug Use: No  . Sexual Activity: No   Other Topics Concern  . 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   2 use tobacco no significant alcohol   No ets   Has fa                Outpatient Prescriptions Prior to Visit  Medication Sig Dispense Refill  . Cyanocobalamin (B-12 PO) Take by mouth.    . ferrous sulfate 325 (65 FE) MG tablet Take 325 mg by mouth daily with breakfast.      . HYDROcodone-acetaminophen (NORCO) 10-325 MG tablet Take 1 tablet by mouth as needed for pain.  Maximum of every 6 hours. 90 tablet 0  . midodrine (PROAMATINE) 5 MG tablet 1 tab  8AM, 1 PM and 1 at 6 PM- Pt gets thru the [place]    . NON FORMULARY 1 tablet. hydroflurocortisone .1 mg AM and midafternoon to prevent low blood pressure and dizziness- Pt gets thru the [place]     . ranitidine (ZANTAC) 150 MG tablet Take 150 mg by  mouth 2 (two) times daily.      . simvastatin (ZOCOR) 40 MG tablet Take 40 mg by mouth. 1/2 tab daily- Gets thru the [place]     No facility-administered medications prior to visit.     EXAM:  BP 140/72 mmHg  Temp(Src) 97.9 F (36.6 C) (Oral)  Ht 5' 6.75" (1.695 m)  Wt 169 lb 8 oz (76.885 kg)  BMI 26.76 kg/m2  Body mass index is 26.76 kg/(m^2).  GENERAL: vitals reviewed and listed above, alert, oriented, appears well hydrated and in no acute distress walks cane  HEENT: atraumatic, conjunctiva  clear, no obvious abnormalities on inspection of external nose and ears PSYCH: pleasant and cooperative, no obvious depression or anxiety  ua clear   ASSESSMENT AND PLAN:  Discussed the following assessment and plan:  Urinary incontinence, unspecified incontinence type - Plan: Ambulatory referral to Urology  Dysuria - Plan: POC Urinalysis Dipstick  Enuresis, nocturnal only - Plan: Ambulatory referral to Urology Reports only nocturnal  Incontinence  And not in day although has sx of  bph dec flow recenetly   Risk of meds etc . [facility] ( [facility]  Instead [facility] will contact us )  -Patient advised to return or  notify health care team  if symptoms worsen ,persist or new concerns arise.  Patient Instructions  No signs of infection  At this time.  Would like you to see  Urology to decide on next best step.  Possible  not emptying bladder   Fully .   This is  Nocturnal  Wetting     There may be interventions but should be defined by urologic  Evaluation.    Urinary Incontinence Urinary incontinence is the involuntary loss of urine from your bladder. CAUSES  There are many causes of urinary incontinence. They include:  Medicines.  Infections.  Prostatic enlargement, leading to overflow of urine from your bladder.  Surgery.  Neurological diseases.  Emotional factors. SIGNS AND SYMPTOMS Urinary Incontinence can be divided into four types: 1. Urge incontinence. Urge incontinence is the involuntary loss of urine before you have the opportunity to go to the bathroom. There is a sudden urge to void but not enough time to reach a bathroom. 2. Stress incontinence. Stress incontinence is the sudden loss of urine with any activity that forces urine to pass. It is commonly caused by anatomical changes to the pelvis and sphincter areas of your body. 3. Overflow incontinence. Overflow incontinence is the loss of urine from an obstructed opening to your bladder. This results in a backup of urine and a resultant buildup of pressure within the bladder. When the pressure within the bladder exceeds the closing pressure of the sphincter, the urine overflows, which causes incontinence, similar to water overflowing a dam. 4. Total incontinence. Total incontinence is the loss of urine as a result of the inability to store urine within your bladder. DIAGNOSIS  Evaluating the cause of incontinence may require:  A thorough and complete medical and obstetric history.  A complete physical exam.  Laboratory tests such as a urine culture and sensitivities. When additional tests are indicated, they can  include:  An ultrasound exam.  Kidney and bladder X-rays.  Cystoscopy. This is an exam of the bladder using a narrow scope.  Urodynamic testing to test the nerve function to the bladder and sphincter areas. TREATMENT  Treatment for urinary incontinence depends on the cause:  For urge incontinence caused by a bacterial infection, antibiotics will  be prescribed. If the urge incontinence is related to medicines you take, your health care provider may have you change the medicine.  For stress incontinence, surgery to re-establish anatomical support to the bladder or sphincter, or both, will often correct the condition.  For overflow incontinence caused by an enlarged prostate, an operation to open the channel through the enlarged prostate will allow the flow of urine out of the bladder. In women with fibroids, a hysterectomy may be recommended.  For total incontinence, surgery on your urinary sphincter may help. An artificial urinary sphincter (an inflatable cuff placed around the urethra) may be required. In women who have developed a hole-like passage between their bladder and vagina (vesicovaginal fistula), surgery to close the fistula often is required. HOME CARE INSTRUCTIONS  Normal daily hygiene and the use of pads or adult diapers that are changed regularly will help prevent odors and skin damage.  Avoid caffeine. It can overstimulate your bladder.  Use the bathroom regularly. Try about every 2-3 hours to go to the bathroom, even if you do not feel the need to do so. Take time to empty your bladder completely. After urinating, wait a minute. Then try to urinate again.  For causes involving nerve dysfunction, keep a log of the medicines you take and a journal of the times you go to the bathroom. SEEK MEDICAL CARE IF:  You experience worsening of pain instead of improvement in pain after your procedure.  Your incontinence becomes worse instead of better. SEE IMMEDIATE MEDICAL CARE  IF:  You experience fever or shaking chills.  You are unable to pass your urine.  You have redness spreading into your groin or down into your thighs. MAKE SURE YOU:   Understand these instructions.   Will watch your condition.  Will get help right away if you are not doing well or get worse.   This information is not intended to replace advice given to you by your health care provider. Make sure you discuss any questions you have with your health care provider.   Document Released: 05/09/2004 Document Revised: 04/22/2014 Document Reviewed: 09/08/2012 Elsevier Interactive Patient Education 2016 [place] K. Tymesha Ditmore M.D.

## 2015-05-15 NOTE — Patient Instructions (Addendum)
No signs of infection  At this time.  Would like you to see  Urology to decide on next best step.  Possible  not emptying bladder   Fully .   This is  Nocturnal  Wetting     There may be interventions but should be defined by urologic  Evaluation.    Urinary Incontinence Urinary incontinence is the involuntary loss of urine from your bladder. CAUSES  There are many causes of urinary incontinence. They include:  Medicines.  Infections.  Prostatic enlargement, leading to overflow of urine from your bladder.  Surgery.  Neurological diseases.  Emotional factors. SIGNS AND SYMPTOMS Urinary Incontinence can be divided into four types: 1. Urge incontinence. Urge incontinence is the involuntary loss of urine before you have the opportunity to go to the bathroom. There is a sudden urge to void but not enough time to reach a bathroom. 2. Stress incontinence. Stress incontinence is the sudden loss of urine with any activity that forces urine to pass. It is commonly caused by anatomical changes to the pelvis and sphincter areas of your body. 3. Overflow incontinence. Overflow incontinence is the loss of urine from an obstructed opening to your bladder. This results in a backup of urine and a resultant buildup of pressure within the bladder. When the pressure within the bladder exceeds the closing pressure of the sphincter, the urine overflows, which causes incontinence, similar to water overflowing a dam. 4. Total incontinence. Total incontinence is the loss of urine as a result of the inability to store urine within your bladder. DIAGNOSIS  Evaluating the cause of incontinence may require:  A thorough and complete medical and obstetric history.  A complete physical exam.  Laboratory tests such as a urine culture and sensitivities. When additional tests are indicated, they can include:  An ultrasound exam.  Kidney and bladder X-rays.  Cystoscopy. This is an exam of the bladder using  a narrow scope.  Urodynamic testing to test the nerve function to the bladder and sphincter areas. TREATMENT  Treatment for urinary incontinence depends on the cause:  For urge incontinence caused by a bacterial infection, antibiotics will be prescribed. If the urge incontinence is related to medicines you take, your health care provider may have you change the medicine.  For stress incontinence, surgery to re-establish anatomical support to the bladder or sphincter, or both, will often correct the condition.  For overflow incontinence caused by an enlarged prostate, an operation to open the channel through the enlarged prostate will allow the flow of urine out of the bladder. In women with fibroids, a hysterectomy may be recommended.  For total incontinence, surgery on your urinary sphincter may help. An artificial urinary sphincter (an inflatable cuff placed around the urethra) may be required. In women who have developed a hole-like passage between their bladder and vagina (vesicovaginal fistula), surgery to close the fistula often is required. HOME CARE INSTRUCTIONS  Normal daily hygiene and the use of pads or adult diapers that are changed regularly will help prevent odors and skin damage.  Avoid caffeine. It can overstimulate your bladder.  Use the bathroom regularly. Try about every 2-3 hours to go to the bathroom, even if you do not feel the need to do so. Take time to empty your bladder completely. After urinating, wait a minute. Then try to urinate again.  For causes involving nerve dysfunction, keep a log of the medicines you take and a journal of the times you go to the bathroom. [place]  CARE IF:  You experience worsening of pain instead of improvement in pain after your procedure.  Your incontinence becomes worse instead of better. SEE IMMEDIATE MEDICAL CARE IF:  You experience fever or shaking chills.  You are unable to pass your urine.  You have redness spreading  into your groin or down into your thighs. MAKE SURE YOU:   Understand these instructions.   Will watch your condition.  Will get help right away if you are not doing well or get worse.   This information is not intended to replace advice given to you by your health care provider. Make sure you discuss any questions you have with your health care provider.   Document Released: 05/09/2004 Document Revised: 04/22/2014 Document Reviewed: 09/08/2012 Elsevier Interactive Patient Education [employer].

## 2015-05-23 ENCOUNTER — Telehealth: Payer: Self-pay | Admitting: Internal Medicine

## 2015-05-23 NOTE — Telephone Encounter (Signed)
Patient would like his Hydrocodone prescription refilled. °

## 2015-05-24 MED ORDER — HYDROCODONE-ACETAMINOPHEN 10-325 MG PO TABS: ORAL_TABLET | ORAL | Status: DC

## 2015-05-24 NOTE — Telephone Encounter (Signed)
Left a message on machine for the pt to pick up at the front desk.

## 2015-05-24 NOTE — Telephone Encounter (Signed)
Can refill x 1 

## 2015-06-05 ENCOUNTER — Ambulatory Visit (INDEPENDENT_AMBULATORY_CARE_PROVIDER_SITE_OTHER): Payer: Commercial Managed Care - HMO | Admitting: Family Medicine

## 2015-06-05 ENCOUNTER — Encounter: Payer: Self-pay | Admitting: Family Medicine

## 2015-06-05 ENCOUNTER — Ambulatory Visit: Payer: Commercial Managed Care - HMO | Admitting: Internal Medicine

## 2015-06-05 VITALS — BP 90/60 | HR 59 | Temp 97.5°F | Wt 165.4 lb

## 2015-06-05 DIAGNOSIS — J209 Acute bronchitis, unspecified: Secondary | ICD-10-CM

## 2015-06-05 DIAGNOSIS — R05 Cough: Secondary | ICD-10-CM

## 2015-06-05 DIAGNOSIS — R059 Cough, unspecified: Secondary | ICD-10-CM

## 2015-06-05 NOTE — Progress Notes (Signed)
Subjective:    Patient ID: Austin Rose, male    DOB: [birth year], 82 y.o.   MRN: [id number]  HPI  Austin Rose is an 82 year old male  who presents today accompanied by his wife with a cough that started 2 weeks ago and  occurs in the morning and at night. Cough is productive "at times" with clear or yellow sputum. Austin Rose denies chills, fever, sweats, rhinitis, sinus pressure/pain, ear pain, dental pain, reflux, N/V, and diarrhea.  Austin Rose reports no recent sick contact exposure and also no history of asthma and bronchitis. No history of ACE inhibitor use. Treatments at home include Theraflu and Delsym which have provided moderate benefit. Austin Rose/wife state that symptoms are improving.   Review of Systems  Constitutional: Negative for fever, chills and fatigue.  HENT: Positive for postnasal drip. Negative for congestion, ear pain, sinus pressure, sneezing, sore throat and tinnitus.   Respiratory: Negative for cough, chest tightness, shortness of breath and wheezing.   Cardiovascular: Negative for chest pain and palpitations.  Gastrointestinal: Negative for nausea, vomiting, diarrhea and constipation.  Genitourinary: Negative for dysuria, urgency and hematuria.  Musculoskeletal:       History of osteoarthritis.   Skin: Negative for rash.  Neurological: Negative for dizziness, light-headedness and headaches.    Past Medical History  Diagnosis Date  . Arthritis   . CAD (coronary artery disease)   . Hypertension   . Hyperlipemia   . Hx of varicella   . Squamous cell skin cancer     pinna   . Hx of echocardiogram 07/20/2010    The cavity size was normal, there is mild aortic stenosis,the aortic root was normal is size, the ascending aorta was normal in size.  [name] History of stress test 09/20/2008    Social History   Social History  . Marital Status: Married    Spouse Name: [name]  . Number of Children: [name]  . Years of Education: [name]   Occupational History  . Not on file.   Social History  Main Topics  . Smoking status: Former Research scientist (life sciences)  . Smokeless tobacco: Current User    Types: Chew    Last Attempt to Quit: 04/15/1978  . Alcohol Use: No  . Drug Use: No  . Sexual Activity: No   Other Topics Concern  . 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   2 use tobacco no significant alcohol   No ets   Has fa                Past Surgical History  Procedure Laterality Date  . Coronary artery bypass graft  04/1999    Dr Roxan Hockey, showing 4 vessel disease and underwent multivessel CABG aththat time.  . Lumbar disc surgery      L4-5  decompression  . Microdisscectomy      bilateral  . Back surgery      15s1Repeat Foraminotomy  . Other surgical history      central decompression  . Cataract surgery      digby     Family History  Problem Relation Age of Onset  . Other Mother 60    brain aneursym  . Anuerysm Mother   . Heart attack Father     Allergies  Allergen Reactions  . Gabapentin Other (See Comments)    Behavior changes noted by family  Better when stopped  2015  . Penicillins     Current Outpatient Prescriptions on File Prior to Visit  Medication Sig Dispense Refill  . Cyanocobalamin (B-12 PO) Take by mouth.    . ferrous sulfate 325 (65 FE) MG tablet Take 325 mg by mouth daily with breakfast.      . HYDROcodone-acetaminophen (NORCO) 10-325 MG tablet Take 1 tablet by mouth as needed for pain.  Maximum of every 6 hours. 90 tablet 0  . midodrine (PROAMATINE) 5 MG tablet 1 tab  8AM, 1 PM and 1 at 6 PM- Pt gets thru the [place]    . NON FORMULARY 1 tablet. hydroflurocortisone .1 mg AM and midafternoon to prevent low blood pressure and dizziness- Pt gets thru the [place]     . ranitidine (ZANTAC) 150 MG tablet Take 150 mg by mouth 2 (two) times daily.      . simvastatin (ZOCOR) 40 MG tablet Take 40 mg by mouth. 1/2 tab  daily- Gets thru the [place]     No current facility-administered medications on file prior to visit.    BP 90/60 mmHg  Pulse 59  Temp(Src) 97.5 F (36.4 C) (Oral)  Wt 165 lb 6.4 oz (75.025 kg)  SpO2 98%      Objective:   Physical Exam  Constitutional: Austin Rose is oriented to person, place, and time.  HENT:  Right Ear: Tympanic membrane normal.  Left Ear: Tympanic membrane normal.  Nose: Right sinus exhibits no maxillary sinus tenderness and no frontal sinus tenderness. Left sinus exhibits no maxillary sinus tenderness and no frontal sinus tenderness.  Mouth/Throat: No posterior oropharyngeal erythema.  Cardiovascular: Normal rate, regular rhythm and intact distal pulses.   Pulmonary/Chest: Austin Rose has no wheezes. Austin Rose has rales.  Faint basilar rales noted bilaterally. Oxygen saturation 98%  Abdominal: Soft. There is no tenderness.  Lymphadenopathy:    Austin Rose has no cervical adenopathy.  Neurological: Austin Rose is alert and oriented to person, place, and time.  Skin: Skin is warm and dry. No rash noted.  Psychiatric: Austin Rose has a normal mood and affect. His behavior is normal.      Assessment & Plan:  1. Acute bronchitis, unspecified organism Advised supportive measures of rest, increase fluids, and Mucinex DM for cough.  Discussed with patient that if symptoms do not improve in 2-3 days, worsen, or Austin Rose if develops a fever >101 or SOB, contact office for further evaluation. Chest X-ray will be ordered if any of these symptoms are noted. 2. Cough Advised patient to stop TheraFlu and use Mucinex DM for cough. Currently, Austin Rose has been prescribed hydrocodone-acetaminophen 10-325 for arthritis which Austin Rose takes without difficulty. Discussed with patient that a night time cough syrup is not advised since Austin Rose currently takes this medication at night that has hydrocodone. Advised patient to continue medication regimen as prescribed by his PCP. Austin Rose voiced understanding and agreed with plan.

## 2015-06-05 NOTE — Patient Instructions (Addendum)
Please increase fluids, rest, and use Mucinex DM for cough. Continue with current medications as prescribed. If you develop a fever, chills, shortness of breath, or any new symptoms, please contact clinic for further evaluation.  Acute Bronchitis Bronchitis is inflammation of the airways that extend from the windpipe into the lungs (bronchi). The inflammation often causes mucus to develop. This leads to a cough, which is the most common symptom of bronchitis.  In acute bronchitis, the condition usually develops suddenly and goes away over time, usually in a couple weeks. Smoking, allergies, and asthma can make bronchitis worse. Repeated episodes of bronchitis may cause further lung problems.  CAUSES Acute bronchitis is most often caused by the same virus that causes a cold. The virus can spread from person to person (contagious) through coughing, sneezing, and touching contaminated objects. SIGNS AND SYMPTOMS   Cough.   Fever.   Coughing up mucus.   Body aches.   Chest congestion.   Chills.   Shortness of breath.   Sore throat.  DIAGNOSIS  Acute bronchitis is usually diagnosed through a physical exam. Your health care provider will also ask you questions about your medical history. Tests, such as chest X-rays, are sometimes done to rule out other conditions.  TREATMENT  Acute bronchitis usually goes away in a couple weeks. Oftentimes, no medical treatment is necessary. Medicines are sometimes given for relief of fever or cough. Antibiotic medicines are usually not needed but may be prescribed in certain situations. In some cases, an inhaler may be recommended to help reduce shortness of breath and control the cough. A cool mist vaporizer may also be used to help thin bronchial secretions and make it easier to clear the chest.  HOME CARE INSTRUCTIONS  Get plenty of rest.   Drink enough fluids to keep your urine clear or pale yellow (unless you have a medical condition that  requires fluid restriction). Increasing fluids may help thin your respiratory secretions (sputum) and reduce chest congestion, and it will prevent dehydration.   Take medicines only as directed by your health care provider.  If you were prescribed an antibiotic medicine, finish it all even if you start to feel better.  Avoid smoking and secondhand smoke. Exposure to cigarette smoke or irritating chemicals will make bronchitis worse. If you are a smoker, consider using nicotine gum or skin patches to help control withdrawal symptoms. Quitting smoking will help your lungs heal faster.   Reduce the chances of another bout of acute bronchitis by washing your hands frequently, avoiding people with cold symptoms, and trying not to touch your hands to your mouth, nose, or eyes.   Keep all follow-up visits as directed by your health care provider.  SEEK MEDICAL CARE IF: Your symptoms do not improve after 1 week of treatment.  SEEK IMMEDIATE MEDICAL CARE IF:  You develop an increased fever or chills.   You have chest pain.   You have severe shortness of breath.  You have bloody sputum.   You develop dehydration.  You faint or repeatedly feel like you are going to pass out.  You develop repeated vomiting.  You develop a severe headache. MAKE SURE YOU:   Understand these instructions.  Will watch your condition.  Will get help right away if you are not doing well or get worse.   This information is not intended to replace advice given to you by your health care provider. Make sure you discuss any questions you have with your health care provider.  Document Released: 05/09/2004 Document Revised: 04/22/2014 Document Reviewed: 09/22/2012 Elsevier Interactive Patient Education [employer].

## 2015-06-13 ENCOUNTER — Telehealth: Payer: Self-pay | Admitting: Internal Medicine

## 2015-06-13 NOTE — Telephone Encounter (Signed)
Patient need a prescription for medication Hydrocodone.[name]

## 2015-06-14 NOTE — Telephone Encounter (Signed)
Refill x1 

## 2015-06-15 NOTE — Telephone Encounter (Signed)
Patient's wife calling about husband medication, he really need to pick it up today,

## 2015-06-16 MED ORDER — HYDROCODONE-ACETAMINOPHEN 10-325 MG PO TABS: ORAL_TABLET | ORAL | Status: DC

## 2015-06-16 NOTE — Telephone Encounter (Signed)
Pt's wife notified to pick up at the front desk. 

## 2015-06-26 ENCOUNTER — Encounter: Payer: Commercial Managed Care - HMO | Admitting: Internal Medicine

## 2015-07-07 ENCOUNTER — Other Ambulatory Visit: Payer: Self-pay | Admitting: Internal Medicine

## 2015-07-07 NOTE — Telephone Encounter (Signed)
Pt needs a new Rx hydrocodone

## 2015-07-07 NOTE — Telephone Encounter (Signed)
Can refill x 1 

## 2015-07-10 MED ORDER — HYDROCODONE-ACETAMINOPHEN 10-325 MG PO TABS: ORAL_TABLET | ORAL | Status: DC

## 2015-07-10 NOTE — Telephone Encounter (Signed)
Printed and given to Dr. Suzzette Righter to sign.

## 2015-07-10 NOTE — Telephone Encounter (Signed)
Patient notified by telephone that script is up front ready for pickup. 

## 2015-07-10 NOTE — Telephone Encounter (Signed)
Pt is out of med

## 2015-07-25 NOTE — Patient Instructions (Addendum)
Will arrange  A referral to  orthopedist in town about your left knee predicament .  GET [place] a copy of Blood tests from the [place] .  Due for a urine tox screen    Today to continue on the pain pills.    Health Maintenance, Male A healthy lifestyle and preventative care can promote health and wellness.  Maintain regular health, dental, and eye exams.  Eat a healthy diet. Foods like vegetables, fruits, whole grains, low-fat dairy products, and lean protein foods contain the nutrients you need and are low in calories. Decrease your intake of foods high in solid fats, added sugars, and salt. Get information about a proper diet from your health care provider, if necessary.  Regular physical exercise is one of the most important things you can do for your health. Most adults should get at least 150 minutes of moderate-intensity exercise (any activity that increases your heart rate and causes you to sweat) each week. In addition, most adults need muscle-strengthening exercises on 2 or more days a week.   Maintain a healthy weight. The body mass index (BMI) is a screening tool to identify possible weight problems. It provides an estimate of body fat based on height and weight. Your health care provider can find your BMI and can help you achieve or maintain a healthy weight. For males 20 years and older:  A BMI below 18.5 is considered underweight.  A BMI of 18.5 to 24.9 is normal.  A BMI of 25 to 29.9 is considered overweight.  A BMI of 30 and above is considered obese.  Maintain normal blood lipids and cholesterol by exercising and minimizing your intake of saturated fat. Eat a balanced diet with plenty of fruits and vegetables. Blood tests for lipids and cholesterol should begin at age 6 and be repeated every 5 years. If your lipid or cholesterol levels are high, you are over age 63, or you are at high risk for heart disease, you may need your cholesterol levels checked more frequently.Ongoing high  lipid and cholesterol levels should be treated with medicines if diet and exercise are not working.  If you smoke, find out from your health care provider how to quit. If you do not use tobacco, do not start.  Lung cancer screening is recommended for adults aged 67-80 years who are at high risk for developing lung cancer because of a history of smoking. A yearly low-dose CT scan of the lungs is recommended for people who have at least a 30-pack-year history of smoking and are current smokers or have quit within the past 15 years. A pack year of smoking is smoking an average of 1 pack of cigarettes a day for 1 year (for example, a 30-pack-year history of smoking could mean smoking 1 pack a day for 30 years or 2 packs a day for 15 years). Yearly screening should continue until the smoker has stopped smoking for at least 15 years. Yearly screening should be stopped for people who develop a health problem that would prevent them from having lung cancer treatment.  If you choose to drink alcohol, do not have more than 2 drinks per day. One drink is considered to be 12 oz (360 mL) of beer, 5 oz (150 mL) of wine, or 1.5 oz (45 mL) of liquor.  Avoid the use of street drugs. Do not share needles with anyone. Ask for help if you need support or instructions about stopping the use of drugs.  High blood pressure  causes heart disease and increases the risk of stroke. High blood pressure is more likely to develop in:  People who have blood pressure in the end of the normal range (100-139/85-89 mm Hg).  People who are overweight or obese.  People who are African American.  If you are 50-19 years of age, have your blood pressure checked every 3-5 years. If you are 80 years of age or older, have your blood pressure checked every year. You should have your blood pressure measured twice--once when you are at a hospital or clinic, and once when you are not at a hospital or clinic. Record the average of the two  measurements. To check your blood pressure when you are not at a hospital or clinic, you can use:  An automated blood pressure machine at a pharmacy.  A home blood pressure monitor.  If you are 71-20 years old, ask your health care provider if you should take aspirin to prevent heart disease.  Diabetes screening involves taking a blood sample to check your fasting blood sugar level. This should be done once every 3 years after age 72 if you are at a normal weight and without risk factors for diabetes. Testing should be considered at a younger age or be carried out more frequently if you are overweight and have at least 1 risk factor for diabetes.  Colorectal cancer can be detected and often prevented. Most routine colorectal cancer screening begins at the age of 75 and continues through age 67. However, your health care provider may recommend screening at an earlier age if you have risk factors for colon cancer. On a yearly basis, your health care provider may provide home test kits to check for hidden blood in the stool. A small camera at the end of a tube may be used to directly examine the colon (sigmoidoscopy or colonoscopy) to detect the earliest forms of colorectal cancer. Talk to your health care provider about this at age 24 when routine screening begins. A direct exam of the colon should be repeated every 5-10 years through age 63, unless early forms of precancerous polyps or small growths are found.  People who are at an increased risk for hepatitis B should be screened for this virus. You are considered at high risk for hepatitis B if:  You were born in a country where hepatitis B occurs often. Talk with your health care provider about which countries are considered high risk.  Your parents were born in a high-risk country and you have not received a shot to protect against hepatitis B (hepatitis B vaccine).  You have HIV or AIDS.  You use needles to inject street drugs.  You live  with, or have sex with, someone who has hepatitis B.  You are a man who has sex with other men (MSM).  You get hemodialysis treatment.  You take certain medicines for conditions like cancer, organ transplantation, and autoimmune conditions.  Hepatitis C blood testing is recommended for all people born from [birth year] through 1963 and any individual with known risk factors for hepatitis C.  Healthy men should no longer receive prostate-specific antigen (PSA) blood tests as part of routine cancer screening. Talk to your health care provider about prostate cancer screening.  Testicular cancer screening is not recommended for adolescents or adult males who have no symptoms. Screening includes self-exam, a health care provider exam, and other screening tests. Consult with your health care provider about any symptoms you have or any concerns you have  about testicular cancer.  Practice safe sex. Use condoms and avoid high-risk sexual practices to reduce the spread of sexually transmitted infections (STIs).  You should be screened for STIs, including gonorrhea and chlamydia if:  You are sexually active and are younger than 24 years.  You are older than 24 years, and your health care provider tells you that you are at risk for this type of infection.  Your sexual activity has changed since you were last screened, and you are at an increased risk for chlamydia or gonorrhea. Ask your health care provider if you are at risk.  If you are at risk of being infected with HIV, it is recommended that you take a prescription medicine daily to prevent HIV infection. This is called pre-exposure prophylaxis (PrEP). You are considered at risk if:  You are a man who has sex with other men (MSM).  You are a heterosexual man who is sexually active with multiple partners.  You take drugs by injection.  You are sexually active with a partner who has HIV.  Talk with your health care provider about whether you are at  high risk of being infected with HIV. If you choose to begin PrEP, you should first be tested for HIV. You should then be tested every 3 months for as long as you are taking PrEP.  Use sunscreen. Apply sunscreen liberally and repeatedly throughout the day. You should seek shade when your shadow is shorter than you. Protect yourself by wearing long sleeves, pants, a wide-brimmed hat, and sunglasses year round whenever you are outdoors.  Tell your health care provider of new moles or changes in moles, especially if there is a change in shape or color. Also, tell your health care provider if a mole is larger than the size of a pencil eraser.  A one-time screening for abdominal aortic aneurysm (AAA) and surgical repair of large AAAs by ultrasound is recommended for men aged 81-75 years who are current or former smokers.  Stay current with your vaccines (immunizations).   This information is not intended to replace advice given to you by your health care provider. Make sure you discuss any questions you have with your health care provider.   Document Released: 09/28/2007 Document Revised: 04/22/2014 Document Reviewed: 08/27/2010 [employer]vier Interactive Patient Education [employer].

## 2015-07-25 NOTE — Progress Notes (Signed)
Pre visit review using our clinic review tool, if applicable. No additional management support is needed unless otherwise documented below in the visit note.  Chief Complaint  Patient presents with  . Medicare Wellness    probelm with knee  med review    HPI: Austin Rose 82 y.o. comes in today for Preventive Medicare wellness visit .Since last visit. More problems with left knee  Swelling  Seen at [facility] and got full labs and x ray iof knee showing djd chondrocalcinosis    Neuropathy the same but taking pain med at times for the knee  . Cannot got ot [place] for ortho  ? What to do  To see uro next week about the worsening incontinence  Health Maintenance  Topic Date Due  . TETANUS/TDAP  07/14/2013  . INFLUENZA VACCINE  11/14/2015  . ZOSTAVAX  Completed  . PNA vac Low Risk Adult  Completed   Health Maintenance Review LIFESTYLE:  TADn except rare etoh  Sugar beverages:n Sleep: 7 hours  Still tries to work in garden    [place]   Hearing:   Aids   Are crazy   Vision:  No limitations at present . Last eye check   2 years  ago has glasses    At the [place]   Safety:  Has smoke detector and wears seat belts.  No firearms. No excess sun exposure. Sees dentist regularly.  Falls: n walks with cane  carelful  Advance directive :  Reviewed  Doesn't have a formal one   Memory: Felt to be good    Mild  family.  Depression: No anhedonia unusual crying or depressive symptoms  Nutrition: Eats well balanced diet; adequate calcium and vitamin D. No swallowing chewing problems.  Injury: no major injuries in the last six months.  Other healthcare providers:  Reviewed today .  Social:  Lives with spouse married. No pets.   Preventive parameters: up-to-date  Reviewed   ADLS:   There are no problems or need for assistance  driving, feeding, obtaining food, dressing, toileting and bathing, managing money using phone. is independent. Walks with cane         ROS:  GEN/ HEENT: No fever, significant weight changes sweats headaches vision problems hearing changes, CV/ PULM; No chest pain shortness of breath cough, syncope,edema  change in exercise tolerance. GI /GU: No adominal pain, vomiting, change in bowel habits. No blood in the stool. No significant GU symptoms. SKIN/HEME: ,no acute skin rashes suspicious lesions or bleeding. No lymphadenopathy, nodules, masses.  NEURO/ PSYCH:  No neurologic signs such as weakness numbness. No depression anxiety. IMM/ Allergy: No unusual infections.  Allergy .   REST of 12 system review negative except as per HPI   Past Medical History  Diagnosis Date  . Arthritis   . CAD (coronary artery disease)   . Hypertension   . Hyperlipemia   . Hx of varicella   . Squamous cell skin cancer     pinna   . Hx of echocardiogram 07/20/2010    The cavity size was normal, there is mild aortic stenosis,the aortic root was normal is size, the ascending aorta was normal in size.  [name] History of stress test 09/20/2008  . Orthostatic hypotension     [facility]    Family History  Problem Relation Age of Onset  . Other Mother 59    brain aneursym  . Anuerysm Mother   . Heart attack Father  Social History   Social History  . Marital Status: Married    Spouse Name: [name]  . Number of Children: [name]  . Years of Education: [name]   Social History Main Topics  . Smoking status: Former Research scientist (life sciences)  . Smokeless tobacco: Current User    Types: Chew    Last Attempt to Quit: 04/15/1978  . Alcohol Use: No  . Drug Use: No  . Sexual Activity: No   Other Topics Concern  . 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   2 use tobacco no significant alcohol   No ets   Has fa                Outpatient Encounter Prescriptions as of 07/26/2015  Medication Sig  .  Cyanocobalamin (B-12 PO) Take by mouth.  . ferrous sulfate 325 (65 FE) MG tablet Take 325 mg by mouth daily with breakfast.    . HYDROcodone-acetaminophen (NORCO) 10-325 MG tablet Take 1 tablet by mouth as needed for pain.  Maximum of every 6 hours.  . midodrine (PROAMATINE) 5 MG tablet 1 tab  8AM, 1 PM and 1 at 6 PM- Pt gets thru the [place]  . NON FORMULARY 1 tablet. hydroflurocortisone .1 mg AM and midafternoon to prevent low blood pressure and dizziness- Pt gets thru the [place]   . ranitidine (ZANTAC) 150 MG tablet Take 150 mg by mouth 2 (two) times daily.    . simvastatin (ZOCOR) 40 MG tablet Take 40 mg by mouth. 1/2 tab daily- Gets thru the [place]   No facility-administered encounter medications on file as of 07/26/2015.    EXAM:  BP 132/82 mmHg  Temp(Src) 98.4 F (36.9 C) (Oral)  Ht 5\' 8"  (1.727 m)  Wt 167 lb 3.2 oz (75.841 kg)  BMI 25.43 kg/m2  Body mass index is 25.43 kg/(m^2).  Physical Exam: Vital signs reviewed [id number] is a well-developed well-nourished alert cooperative   who appears stated age in no acute distress.  Hearing aids in place   Nl interaction HEENT: normocephalic atraumatic , Eyes: PERRL EOM's full, conjunctiva clear, Nares: paten,t no deformity discharge or tenderness., Ears: aids in place TMs with normal landmarks. Mouth: clear OP, no lesions, edema.  Moist mucous membranes. Dentures NECK: supple without masses, thyromegaly or bruits. CHEST/PULM:  Clear to auscultation and percussion breath sounds equal no wheeze , rales or rhonchi. No chest wall deformities or tenderness. CV: PMI is nondisplaced, S1 S2 no gallops, murmurs, rubs. Peripheral pulses a not delayed ? 1+ left vs right nl cap refill .No JVD . Well healed scar midline  ABDOMEN: Bowel sounds normal nontender  No guard or rebound, no hepato splenomegal no CVA tenderness.   Extremtities:  No clubbing cyanosis or edema, no acute joint swelling or redness no focal atrophy NEURO:  Oriented x3, cranial nerves 3-12  appear to be intact, no obvious focal weakness,walks with cane  Flat foot   nueropathic gait but steadySKIN: No acute rashes normal turgor, color, no bruising or petechiae. Changes  Feet  Thickened toe nails   No ulcer or callus PSYCH: Oriented, good eye contact, no obvious depression anxiety, cognition and judgment appear normal. LN: no cervical axillary inguinal adenopathy No noted gross  deficits in memory, attention, and speech.   Lab Results  Component Value Date   WBC 6.4 06/06/2014   HGB 13.6 06/06/2014   HCT 40.8 06/06/2014   PLT 211.0 06/06/2014   GLUCOSE 88 06/06/2014   CHOL 136 06/06/2014   TRIG 132.0 06/06/2014   HDL 51.80 06/06/2014   LDLCALC 58 06/06/2014   ALT 7 06/06/2014   AST 16 06/06/2014   NA 139 06/06/2014   K 4.5 06/06/2014   CL 107 06/06/2014   CREATININE 1.24 06/06/2014   BUN 17 06/06/2014   CO2 24 06/06/2014   TSH 2.62 06/06/2014   PSA 0.49 Test Methodology: Hybritech PSA 07/18/2010    ASSESSMENT AND PLAN:  Discussed the following assessment and plan:  Visit for preventive health examination  Medicare annual wellness visit, subsequent  Medication management  Hereditary and idiopathic peripheral neuropathy - the same cane and   pain meds   Pain management  Hyperlipidemia  [facility]ondrocalcinosis [name] will refer in town problematic nueroppthy stable  Age factor Multi site care   We dont get records from the [place] so pat will get [place] labs  Gets all from the [place] except pain medication     For whatever reason  And labs to get to [place]   barrieres to seeing ortho and will do referral in town. Caution with pain =ilss that he has taken for years... Pt aware and cautious of falling tox screen today  Over due Patient Care Team: Burnis Medin, MD as PCP - General (Internal Medicine) Rivis, Lorenza Cambridge, MD as Attending Physician (Cardiology) Sable Feil, MD as Attending Physician (Gastroenterology) Calvert Cantor, MD (Ophthalmology) [facility]    Patient Instructions  Will arrange  A referral to  orthopedist in town about your left knee predicament .  GET [place] a copy of Blood tests from the [place] .  Due for a urine tox screen    Today to continue on the pain pills.    Health Maintenance, Male A healthy lifestyle and preventative care can promote health and wellness.  Maintain regular health, dental, and eye exams.  Eat a healthy diet. Foods like vegetables, fruits, whole grains, low-fat dairy products, and lean protein foods contain the nutrients you need and are low in calories. Decrease your intake of foods high in solid fats, added sugars, and salt. Get information about a proper diet from your health care provider, if necessary.  Regular physical exercise is one of the most important things you can do for your health. Most adults should get at least 150 minutes of moderate-intensity exercise (any activity that increases your heart rate and causes you to sweat) each week. In addition, most adults need muscle-strengthening exercises on 2 or more days a week.   Maintain a healthy weight. The body mass index (BMI) is a screening tool to identify possible weight problems. It provides an estimate of body fat based on height and weight. Your health care provider can find your BMI and can help you achieve or maintain a healthy weight. For males 20 years and older:  A BMI below 18.5 is considered underweight.  A BMI of 18.5 to 24.9 is normal.  A BMI of 25 to 29.9 is considered overweight.  A BMI of 30 and above is considered obese.  Maintain normal blood lipids and cholesterol by exercising and minimizing your intake of saturated fat. Eat a balanced diet with plenty of fruits and vegetables. Blood tests for lipids and cholesterol should begin at age  20 and be repeated every 5 years. If your lipid or cholesterol levels are high, you are over age 47, or you are at high risk for heart disease, you may need  your cholesterol levels checked more frequently.Ongoing high lipid and cholesterol levels should be treated with medicines if diet and exercise are not working.  If you smoke, find out from your health care provider how to quit. If you do not use tobacco, do not start.  Lung cancer screening is recommended for adults aged 42-80 years who are at high risk for developing lung cancer because of a history of smoking. A yearly low-dose CT scan of the lungs is recommended for people who have at least a 30-pack-year history of smoking and are current smokers or have quit within the past 15 years. A pack year of smoking is smoking an average of 1 pack of cigarettes a day for 1 year (for example, a 30-pack-year history of smoking could mean smoking 1 pack a day for 30 years or 2 packs a day for 15 years). Yearly screening should continue until the smoker has stopped smoking for at least 15 years. Yearly screening should be stopped for people who develop a health problem that would prevent them from having lung cancer treatment.  If you choose to drink alcohol, do not have more than 2 drinks per day. One drink is considered to be 12 oz (360 mL) of beer, 5 oz (150 mL) of wine, or 1.5 oz (45 mL) of liquor.  Avoid the use of street drugs. Do not share needles with anyone. Ask for help if you need support or instructions about stopping the use of drugs.  High blood pressure causes heart disease and increases the risk of stroke. High blood pressure is more likely to develop in:  People who have blood pressure in the end of the normal range (100-139/85-89 mm Hg).  People who are overweight or obese.  People who are African American.  If you are 52-99 years of age, have your blood pressure checked every 3-5 years. If you are 71 years of age or older, have your blood pressure checked every year. You should have your blood pressure measured twice--once when you are at a hospital or clinic, and once when you are not  at a hospital or clinic. Record the average of the two measurements. To check your blood pressure when you are not at a hospital or clinic, you can use:  An automated blood pressure machine at a pharmacy.  A home blood pressure monitor.  If you are 46-1 years old, ask your health care provider if you should take aspirin to prevent heart disease.  Diabetes screening involves taking a blood sample to check your fasting blood sugar level. This should be done once every 3 years after age 31 if you are at a normal weight and without risk factors for diabetes. Testing should be considered at a younger age or be carried out more frequently if you are overweight and have at least 1 risk factor for diabetes.  Colorectal cancer can be detected and often prevented. Most routine colorectal cancer screening begins at the age of 56 and continues through age 62. However, your health care provider may recommend screening at an earlier age if you have risk factors for colon cancer. On a yearly basis, your health care provider may provide home test kits to check for hidden blood in the stool. A small camera at the end of a tube may  be used to directly examine the colon (sigmoidoscopy or colonoscopy) to detect the earliest forms of colorectal cancer. Talk to your health care provider about this at age 67 when routine screening begins. A direct exam of the colon should be repeated every 5-10 years through age 69, unless early forms of precancerous polyps or small growths are found.  People who are at an increased risk for hepatitis B should be screened for this virus. You are considered at high risk for hepatitis B if:  You were born in a country where hepatitis B occurs often. Talk with your health care provider about which countries are considered high risk.  Your parents were born in a high-risk country and you have not received a shot to protect against hepatitis B (hepatitis B vaccine).  You have HIV or  AIDS.  You use needles to inject street drugs.  You live with, or have sex with, someone who has hepatitis B.  You are a man who has sex with other men (MSM).  You get hemodialysis treatment.  You take certain medicines for conditions like cancer, organ transplantation, and autoimmune conditions.  Hepatitis C blood testing is recommended for all people born from [birth year] through 1963 and any individual with known risk factors for hepatitis C.  Healthy men should no longer receive prostate-specific antigen (PSA) blood tests as part of routine cancer screening. Talk to your health care provider about prostate cancer screening.  Testicular cancer screening is not recommended for adolescents or adult males who have no symptoms. Screening includes self-exam, a health care provider exam, and other screening tests. Consult with your health care provider about any symptoms you have or any concerns you have about testicular cancer.  Practice safe sex. Use condoms and avoid high-risk sexual practices to reduce the spread of sexually transmitted infections (STIs).  You should be screened for STIs, including gonorrhea and chlamydia if:  You are sexually active and are younger than 24 years.  You are older than 24 years, and your health care provider tells you that you are at risk for this type of infection.  Your sexual activity has changed since you were last screened, and you are at an increased risk for chlamydia or gonorrhea. Ask your health care provider if you are at risk.  If you are at risk of being infected with HIV, it is recommended that you take a prescription medicine daily to prevent HIV infection. This is called pre-exposure prophylaxis (PrEP). You are considered at risk if:  You are a man who has sex with other men (MSM).  You are a heterosexual man who is sexually active with multiple partners.  You take drugs by injection.  You are sexually active with a partner who has  HIV.  Talk with your health care provider about whether you are at high risk of being infected with HIV. If you choose to begin PrEP, you should first be tested for HIV. You should then be tested every 3 months for as long as you are taking PrEP.  Use sunscreen. Apply sunscreen liberally and repeatedly throughout the day. You should seek shade when your shadow is shorter than you. Protect yourself by wearing long sleeves, pants, a wide-brimmed hat, and sunglasses year round whenever you are outdoors.  Tell your health care provider of new moles or changes in moles, especially if there is a change in shape or color. Also, tell your health care provider if a mole is larger than the size of a  pencil eraser.  A one-time screening for abdominal aortic aneurysm (AAA) and surgical repair of large AAAs by ultrasound is recommended for men aged 95-75 years who are current or former smokers.  Stay current with your vaccines (immunizations).   This information is not intended to replace advice given to you by your health care provider. Make sure you discuss any questions you have with your health care provider.   Document Released: 09/28/2007 Document Revised: 04/22/2014 Document Reviewed: 08/27/2010 Elsevier Interactive Patient Education 2016 [place] K. Panosh M.D.

## 2015-07-26 ENCOUNTER — Ambulatory Visit (INDEPENDENT_AMBULATORY_CARE_PROVIDER_SITE_OTHER): Payer: Commercial Managed Care - HMO | Admitting: Internal Medicine

## 2015-07-26 ENCOUNTER — Encounter: Payer: Self-pay | Admitting: Internal Medicine

## 2015-07-26 VITALS — BP 132/82 | Temp 98.4°F | Ht 68.0 in | Wt 167.2 lb

## 2015-07-26 DIAGNOSIS — Z79899 Other long term (current) drug therapy: Secondary | ICD-10-CM | POA: Diagnosis not present

## 2015-07-26 DIAGNOSIS — IMO0001 Reserved for inherently not codable concepts without codable children: Secondary | ICD-10-CM

## 2015-07-26 DIAGNOSIS — Z5189 Encounter for other specified aftercare: Secondary | ICD-10-CM | POA: Diagnosis not present

## 2015-07-26 DIAGNOSIS — Z0000 Encounter for general adult medical examination without abnormal findings: Secondary | ICD-10-CM

## 2015-07-26 DIAGNOSIS — Z79891 Long term (current) use of opiate analgesic: Secondary | ICD-10-CM | POA: Diagnosis not present

## 2015-07-26 DIAGNOSIS — E785 Hyperlipidemia, unspecified: Secondary | ICD-10-CM

## 2015-07-26 DIAGNOSIS — R52 Pain, unspecified: Secondary | ICD-10-CM

## 2015-07-26 DIAGNOSIS — M11262 Other chondrocalcinosis, left knee: Secondary | ICD-10-CM

## 2015-07-26 DIAGNOSIS — G609 Hereditary and idiopathic neuropathy, unspecified: Secondary | ICD-10-CM | POA: Diagnosis not present

## 2015-07-26 DIAGNOSIS — R54 Age-related physical debility: Secondary | ICD-10-CM

## 2015-07-31 ENCOUNTER — Telehealth: Payer: Self-pay | Admitting: Internal Medicine

## 2015-07-31 NOTE — Telephone Encounter (Signed)
Pt need new Rx for Hydrocodone °

## 2015-07-31 NOTE — Telephone Encounter (Signed)
Ok to refill x 1  

## 2015-08-01 MED ORDER — HYDROCODONE-ACETAMINOPHEN 10-325 MG PO TABS: ORAL_TABLET | ORAL | Status: DC

## 2015-08-01 NOTE — Telephone Encounter (Signed)
Patient's wife notified to pick up at the front desk. 

## 2015-08-08 ENCOUNTER — Encounter: Payer: Self-pay | Admitting: Internal Medicine

## 2015-08-21 ENCOUNTER — Telehealth: Payer: Self-pay | Admitting: Internal Medicine

## 2015-08-21 NOTE — Telephone Encounter (Signed)
Ok x 1

## 2015-08-21 NOTE — Telephone Encounter (Signed)
° ° °  Pt request refill of the following: ° ° °HYDROcodone-acetaminophen (NORCO) 10-325 MG tablet ° ° °Phamacy: °

## 2015-08-22 DIAGNOSIS — M25362 Other instability, left knee: Secondary | ICD-10-CM | POA: Diagnosis not present

## 2015-08-22 DIAGNOSIS — M2242 Chondromalacia patellae, left knee: Secondary | ICD-10-CM | POA: Diagnosis not present

## 2015-08-22 DIAGNOSIS — M1712 Unilateral primary osteoarthritis, left knee: Secondary | ICD-10-CM | POA: Diagnosis not present

## 2015-08-22 MED ORDER — HYDROCODONE-ACETAMINOPHEN 10-325 MG PO TABS: ORAL_TABLET | ORAL | Status: DC

## 2015-08-22 NOTE — Telephone Encounter (Signed)
Left a message for the pt to pick up at the front desk. 

## 2015-09-12 ENCOUNTER — Telehealth: Payer: Self-pay | Admitting: Internal Medicine

## 2015-09-12 NOTE — Telephone Encounter (Signed)
Pt need new Rx for Hydrocodone °

## 2015-09-13 MED ORDER — HYDROCODONE-ACETAMINOPHEN 10-325 MG PO TABS: ORAL_TABLET | ORAL | Status: DC

## 2015-09-13 NOTE — Telephone Encounter (Signed)
Pt out of Rx.

## 2015-09-13 NOTE — Telephone Encounter (Signed)
Ok to refill x 1  

## 2015-09-13 NOTE — Telephone Encounter (Signed)
Rx ready for pick up, Notified pt

## 2015-09-13 NOTE — Telephone Encounter (Signed)
Last OV 07/26/15... Last filled 08/22/15 #90 ... Okay to refill?

## 2015-10-03 ENCOUNTER — Telehealth: Payer: Self-pay | Admitting: Internal Medicine

## 2015-10-03 NOTE — Telephone Encounter (Signed)
Pt need new Rx for Hydrocodone °

## 2015-10-04 NOTE — Telephone Encounter (Signed)
Can refill x 1 

## 2015-10-05 DIAGNOSIS — M1712 Unilateral primary osteoarthritis, left knee: Secondary | ICD-10-CM | POA: Diagnosis not present

## 2015-10-05 MED ORDER — HYDROCODONE-ACETAMINOPHEN 10-325 MG PO TABS: ORAL_TABLET | ORAL | Status: DC

## 2015-10-05 NOTE — Telephone Encounter (Signed)
Left a message for the pt to pick up at the front desk. 

## 2015-10-12 DIAGNOSIS — M1712 Unilateral primary osteoarthritis, left knee: Secondary | ICD-10-CM | POA: Diagnosis not present

## 2015-10-21 DIAGNOSIS — M1712 Unilateral primary osteoarthritis, left knee: Secondary | ICD-10-CM | POA: Diagnosis not present

## 2015-10-21 DIAGNOSIS — M25562 Pain in left knee: Secondary | ICD-10-CM | POA: Diagnosis not present

## 2015-10-21 DIAGNOSIS — M2242 Chondromalacia patellae, left knee: Secondary | ICD-10-CM | POA: Diagnosis not present

## 2015-10-21 DIAGNOSIS — M25662 Stiffness of left knee, not elsewhere classified: Secondary | ICD-10-CM | POA: Diagnosis not present

## 2015-10-24 ENCOUNTER — Telehealth: Payer: Self-pay | Admitting: Internal Medicine

## 2015-10-24 NOTE — Telephone Encounter (Signed)
A little early  But can refill    Tomorrow  X 1

## 2015-10-24 NOTE — Telephone Encounter (Signed)
Pt need new Rx for Hydrocodone °

## 2015-10-25 MED ORDER — HYDROCODONE-ACETAMINOPHEN 10-325 MG PO TABS: ORAL_TABLET | ORAL | Status: DC

## 2015-10-25 NOTE — Telephone Encounter (Signed)
Pt notified to pickup at the front desk after 10 AM this morning.

## 2015-11-13 ENCOUNTER — Telehealth: Payer: Self-pay | Admitting: Internal Medicine

## 2015-11-13 MED ORDER — HYDROCODONE-ACETAMINOPHEN 10-325 MG PO TABS: ORAL_TABLET | ORAL | 0 refills | Status: DC

## 2015-11-13 NOTE — Telephone Encounter (Signed)
Pt need new Hydrocodone

## 2015-11-13 NOTE — Telephone Encounter (Signed)
Left a message for the pt to pick up at the front desk. 

## 2015-11-13 NOTE — Telephone Encounter (Signed)
Ok   Refill x 1

## 2015-11-23 NOTE — Progress Notes (Deleted)
No chief complaint on file.   HPI: Austin Rose 82 y.o.  [name]  comes in today for follow up of  multiple medical problems.  4 months  See below    Visit for preventive health examination  Medicare annual wellness visit, subsequent  Medication management  Hereditary and idiopathic peripheral neuropathy - the same cane and   pain meds   Pain management  Hyperlipidemia  Chondrocalcinosis of left [name] will refer in town problematic nueroppthy stable  Age factor Multi site care   We dont get records from the [place] so pat will get [place] labs  Gets all from the [place] except pain medication     For whatever reason  And labs to get to [place]   barrieres to seeing ortho and will do referral in town. Caution with pain =ilss that he has taken for years... Pt aware and cautious of falling tox screen today  Over due Patient Care Team: Burnis Medin, MD as PCP - General (Internal Medicine) Rivis, Lorenza Cambridge, MD as Attending Physician (Cardiology) Sable Feil, MD as Attending Physician (Gastroenterology) Calvert Cantor, MD (Ophthalmology) [facility]   ROS: See pertinent positives and negatives per HPI.  Past Medical History:  Diagnosis Date  . Arthritis   . CAD (coronary artery disease)   . History of stress test 09/20/2008  . Hx of echocardiogram 07/20/2010   The cavity size was normal, there is mild aortic stenosis,the aortic root was normal is size, the ascending aorta was normal in size.  [name] Hx of varicella   . Hyperlipemia   . Hypertension   . Orthostatic hypotension    on mitodrine per [place]  . Squamous cell skin cancer    pinna     Family History  Problem Relation Age of Onset  . Other Mother 34    brain aneursym  . Anuerysm Mother   . Heart attack Father     Social History   Social History  . Marital status: Married    Spouse name: [name]  . Number of children: [name]  . Years of education: [name]   Social History  Main Topics  . Smoking status: Former Research scientist (life sciences)  . Smokeless tobacco: Current User    Types: Chew    Last attempt to quit: 04/15/1978  . Alcohol use No  . Drug use: No  . Sexual activity: No   Other Topics Concern  . 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   2 use tobacco no significant alcohol   No ets   Has fa                Outpatient Medications Prior to Visit  Medication Sig Dispense Refill  . Cyanocobalamin (B-12 PO) Take by mouth.    . ferrous sulfate 325 (65 FE) MG tablet Take 325 mg by mouth daily with breakfast.      . HYDROcodone-acetaminophen (NORCO) 10-325 MG tablet Take 1 tablet by mouth as needed for pain.  Maximum of every 6 hours. 90 tablet 0  . midodrine (PROAMATINE) 5 MG tablet 1 tab  8AM, 1 PM and 1 at 6 PM- Pt gets thru the [place]    . NON  FORMULARY 1 tablet. hydroflurocortisone .1 mg AM and midafternoon to prevent low blood pressure and dizziness- Pt gets thru the [place]     . ranitidine (ZANTAC) 150 MG tablet Take 150 mg by mouth 2 (two) times daily.      . simvastatin (ZOCOR) 40 MG tablet Take 40 mg by mouth. 1/2 tab daily- Gets thru the [place]     No facility-administered medications prior to visit.      EXAM:  There were no vitals taken for this visit.  There is no height or weight on file to calculate BMI.  GENERAL: vitals reviewed and listed above, alert, oriented, appears well hydrated and in no acute distress HEENT: atraumatic, conjunctiva  clear, no obvious abnormalities on inspection of external nose and ears OP : no lesion edema or exudate  NECK: no obvious masses on inspection palpation  LUNGS: clear to auscultation bilaterally, no wheezes, rales or rhonchi, good air movement CV: HRRR, no clubbing cyanosis or  peripheral edema nl cap refill  MS: moves all extremities without noticeable focal   abnormality PSYCH: pleasant and cooperative, no obvious depression or anxiety Lab Results  Component Value Date   WBC 6.4 06/06/2014   HGB 13.6 06/06/2014   HCT 40.8 06/06/2014   PLT 211.0 06/06/2014   GLUCOSE 88 06/06/2014   CHOL 136 06/06/2014   TRIG 132.0 06/06/2014   HDL 51.80 06/06/2014   LDLCALC 58 06/06/2014   ALT 7 06/06/2014   AST 16 06/06/2014   NA 139 06/06/2014   K 4.5 06/06/2014   CL 107 06/06/2014   CREATININE 1.24 06/06/2014   BUN 17 06/06/2014   CO2 24 06/06/2014   TSH 2.62 06/06/2014   PSA 0.49 Test Methodology: Hybritech PSA 07/18/2010    ASSESSMENT AND PLAN:  Discussed the following assessment and plan:  Medication management  -Patient advised to return or notify health care team  if symptoms worsen ,persist or new concerns arise.  There are no Patient Instructions on file for this visit.   Standley Brooking. Adaysha Dubinsky M.D.

## 2015-11-24 ENCOUNTER — Ambulatory Visit: Payer: Commercial Managed Care - HMO | Admitting: Internal Medicine

## 2015-11-24 ENCOUNTER — Telehealth: Payer: Self-pay | Admitting: Family Medicine

## 2015-11-24 DIAGNOSIS — Z0289 Encounter for other administrative examinations: Secondary | ICD-10-CM

## 2015-11-24 NOTE — Telephone Encounter (Signed)
Pt missed appointment today.  Left a message for a return call.

## 2015-12-04 ENCOUNTER — Telehealth: Payer: Self-pay | Admitting: Internal Medicine

## 2015-12-04 NOTE — Telephone Encounter (Signed)
Pt request refill  °HYDROcodone-acetaminophen (NORCO) 10-325 MG tablet °

## 2015-12-05 ENCOUNTER — Encounter: Payer: Self-pay | Admitting: Internal Medicine

## 2015-12-05 DIAGNOSIS — Z79891 Long term (current) use of opiate analgesic: Secondary | ICD-10-CM | POA: Diagnosis not present

## 2015-12-05 MED ORDER — HYDROCODONE-ACETAMINOPHEN 10-325 MG PO TABS: ORAL_TABLET | ORAL | 0 refills | Status: DC

## 2015-12-05 NOTE — Telephone Encounter (Signed)
Pt notified to pick up at the front desk and will need to give a urine sample when he comes.

## 2015-12-05 NOTE — Telephone Encounter (Signed)
Ok to refill x 1   Please obtain tox screen  Last one was  June 16

## 2015-12-25 ENCOUNTER — Telehealth: Payer: Self-pay | Admitting: Internal Medicine

## 2015-12-25 NOTE — Telephone Encounter (Signed)
Pt need new Rx for Hydrocodone °

## 2015-12-26 NOTE — Telephone Encounter (Signed)
Ok to refill by  End of this week

## 2015-12-27 MED ORDER — HYDROCODONE-ACETAMINOPHEN 10-325 MG PO TABS: ORAL_TABLET | ORAL | 0 refills | Status: DC

## 2015-12-27 NOTE — Telephone Encounter (Signed)
Pt stated he is out of medication.

## 2015-12-27 NOTE — Telephone Encounter (Signed)
Left a message for a return call.  Rx will be available for pick up on 12/28/15 morning.  Pt needs to be notified.

## 2015-12-27 NOTE — Telephone Encounter (Signed)
Pt notified by Libby Maw to pick up on Friday.

## 2015-12-27 NOTE — Telephone Encounter (Signed)
Pt calling to check the status of Rx °

## 2015-12-27 NOTE — Telephone Encounter (Signed)
Ok to refill now? 

## 2015-12-28 NOTE — Telephone Encounter (Signed)
Pt called to ck the status and he stated that he had not been called to say it was ready.

## 2015-12-28 NOTE — Telephone Encounter (Signed)
Pt has been notified to pick up at the front desk.

## 2016-01-18 ENCOUNTER — Telehealth: Payer: Self-pay | Admitting: Internal Medicine

## 2016-01-18 MED ORDER — HYDROCODONE-ACETAMINOPHEN 10-325 MG PO TABS: ORAL_TABLET | ORAL | 0 refills | Status: DC

## 2016-01-18 NOTE — Telephone Encounter (Signed)
Ok to refill x 1  

## 2016-01-18 NOTE — Telephone Encounter (Signed)
Left a message for a return call.  Prescription is ready for pickup.  Pt needs to be notified.

## 2016-01-18 NOTE — Telephone Encounter (Signed)
Pt needs new rx hydrocodone °

## 2016-01-19 NOTE — Telephone Encounter (Signed)
Pt's wife notified to have pt pick up at the front desk.

## 2016-02-07 ENCOUNTER — Telehealth: Payer: Self-pay | Admitting: Internal Medicine

## 2016-02-07 MED ORDER — HYDROCODONE-ACETAMINOPHEN 10-325 MG PO TABS: ORAL_TABLET | ORAL | 0 refills | Status: DC

## 2016-02-07 NOTE — Telephone Encounter (Signed)
Missed last appt in august .  Please have him make appt  For  Next week     Then can refill x 1 once on the schedule  No further refills until OV

## 2016-02-07 NOTE — Telephone Encounter (Signed)
Pt need new rx hydrocodone °

## 2016-02-07 NOTE — Telephone Encounter (Signed)
Pt notified to pick up rx and scheduled to see WP on 02/09/16 @ 11 AM.

## 2016-02-09 ENCOUNTER — Encounter: Payer: Self-pay | Admitting: Internal Medicine

## 2016-02-09 ENCOUNTER — Ambulatory Visit (INDEPENDENT_AMBULATORY_CARE_PROVIDER_SITE_OTHER): Payer: Commercial Managed Care - HMO | Admitting: Internal Medicine

## 2016-02-09 VITALS — BP 120/72 | Temp 98.0°F | Ht 68.0 in | Wt 169.4 lb

## 2016-02-09 DIAGNOSIS — G609 Hereditary and idiopathic neuropathy, unspecified: Secondary | ICD-10-CM | POA: Diagnosis not present

## 2016-02-09 DIAGNOSIS — R52 Pain, unspecified: Secondary | ICD-10-CM

## 2016-02-09 DIAGNOSIS — Z79899 Other long term (current) drug therapy: Secondary | ICD-10-CM

## 2016-02-09 NOTE — Progress Notes (Signed)
Chief Complaint  Patient presents with  . Follow-up   Austin Rose is an 82 year old married gentleman who comes in today as requested for medication refill and evaluation. He has had neuropathic pain in his feet for a number of years and was placed on hydrocodone years ago by Dr. Joni Fears. It seems to have helped him function on a day-to-day basis without significant major side effects. He takes One in am until after lunch  And then burning   Next one after  lunch sometimes. He gets leg shaking and painWaking up at night   Waking up at night . And the pain pill helps him sleep. He usually gets his other care at the [facility] but he hasn't been there recently. His wife just had back surgery he is helping care area No feet ulcers major difficulties checks it he has no falling. He is becoming very careful with this. Denies chest pain shortness of breath.  ROS: See pertinent positives and negatives per HPI. No cough falling syncope.  Past Medical History:  Diagnosis Date  . Arthritis   . CAD (coronary artery disease)   . History of stress test 09/20/2008  . Hx of echocardiogram 07/20/2010   The cavity size was normal, there is mild aortic stenosis,the aortic root was normal is size, the ascending aorta was normal in size.  [name] Hx of varicella   . Hyperlipemia   . Hypertension   . Orthostatic hypotension    on mitodrine per [facility]  . Squamous cell skin cancer    pinna     Family History  Problem Relation Age of Onset  . Other Mother 64    brain aneursym  . Anuerysm Mother   . Heart attack Father     Social History   Social History  . Marital status: Married    Spouse name: [name]  . Number of children: [name]  . Years of education: [name]   Social History Main Topics  . Smoking status: Former Research scientist (life sciences)  . Smokeless tobacco: Current User    Types: Chew    Last attempt to quit: 04/15/1978  . Alcohol use No  . Drug use: No  . Sexual activity: No   Other Topics Concern  . 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   2 use tobacco no significant alcohol   No ets   Has fa                Outpatient Medications Prior to Visit  Medication Sig Dispense Refill  . Cyanocobalamin (B-12 PO) Take by mouth.    . ferrous sulfate 325 (65 FE) MG tablet Take 325 mg by mouth daily with breakfast.      . HYDROcodone-acetaminophen (NORCO) 10-325 MG tablet Take 1 tablet by mouth as needed for pain.  Maximum of every 6 hours. 90 tablet 0  . midodrine (PROAMATINE) 5 MG tablet 1 tab  8AM, 1 PM and 1 at 6 PM- Pt gets thru the [facility] [place]    . NON FORMULARY 1 tablet. hydroflurocortisone .1 mg AM and midafternoon to prevent low blood pressure and dizziness- Pt gets thru the [place]     . ranitidine (ZANTAC) 150 MG tablet Take 150 mg by mouth 2 (two) times daily.      [name]  simvastatin (ZOCOR) 40 MG tablet Take 40 mg by mouth. 1/2 tab daily- Gets thru the [place]     No facility-administered medications prior to visit.      EXAM:  BP 120/72 (BP Location: Left Arm, Patient Position: Sitting, Cuff Size: Normal)   Temp 98 F (36.7 C) (Oral)   Ht 5\' 8"  (1.727 m)   Wt 169 lb 6.4 oz (76.8 kg)   BMI 25.76 kg/m   Body mass index is 25.76 kg/m.  GENERAL: vitals reviewed and listed above, alert, oriented, appears well hydrated and in no acute distressHe walks with a cane speech is normal face has scaling like seborrhea but no other acute rashes. HEENT: atraumatic, conjunctiva  clear, no obvious abnormalities on inspection of external nose and ears  NECK: no obvious masses on inspection palpation  LUNGS: clear to auscultation bilaterally, no wheezes, rales or rhonchi, good air movement CV: HRRR, no clubbing cyanosis or  peripheral edema nl cap refill  MS: moves all extremities without noticeable focal  abnormality feet has some scale on the bottom but no calluses ulcers nails are  thickened but no redness. Walks with a cane slightly flat-footed but independent. PSYCH: pleasant and cooperative, no obvious depression or anxiety Lab Results  Component Value Date   WBC 6.4 06/06/2014   HGB 13.6 06/06/2014   HCT 40.8 06/06/2014   PLT 211.0 06/06/2014   GLUCOSE 88 06/06/2014   CHOL 136 06/06/2014   TRIG 132.0 06/06/2014   HDL 51.80 06/06/2014   LDLCALC 58 06/06/2014   ALT 7 06/06/2014   AST 16 06/06/2014   NA 139 06/06/2014   K 4.5 06/06/2014   CL 107 06/06/2014   CREATININE 1.24 06/06/2014   BUN 17 06/06/2014   CO2 24 06/06/2014   TSH 2.62 06/06/2014   PSA 0.49 Test Methodology: [employer] PSA 07/18/2010    ASSESSMENT AND PLAN:  Discussed the following assessment and plan:  Hereditary and idiopathic peripheral neuropathy  Medication management  Pain management  High risk medication use Reviewed medicine risk benefits at this time he feels that it helps him more than at risk. Would have him minimize use unless needed. He has been on this medicine for many years his neuropathy does not appear to be progressing. He was unable to take gabapentin. Not sure he would be a candidate for Lyrica. He can try alpha lipoic acid uncertain if that will help him if this is not diabetic neuropathy. -Patient advised to return or notify health care team  if symptoms worsen ,persist or new concerns arise. Total visit 85mins > 50% spent counseling and coordinating care as indicated in above note and in instructions to patient .    Expectant management.  Patient Instructions  Try to limit   Pain pill to night and 1/2    At a time when needed.       Some people. Get help with also  ALPHA LIPOIC ACID     600 mg per day  Was used in people with diabetic neuropathy also .   Fall prevention also . To continue   Can make  wellness visit with Wynetta Fines our health coach in 3-4 months and then  and  physical and med labs appointments  with me. In 6 months    Fall Prevention  in the Home  Falls can cause injuries and can affect people from all age groups. There are many simple things that you can do to make your home safe and to help  prevent falls. WHAT CAN I DO ON THE OUTSIDE OF MY HOME?  Regularly repair the edges of walkways and driveways and fix any cracks.  Remove high doorway thresholds.  Trim any shrubbery on the main path into your home.  Use bright outdoor lighting.  Clear walkways of debris and clutter, including tools and rocks.  Regularly check that handrails are securely fastened and in good repair. Both sides of any steps should have handrails.  Install guardrails along the edges of any raised decks or porches.  Have leaves, snow, and ice cleared regularly.  Use sand or salt on walkways during winter months.  In the garage, clean up any spills right away, including grease or oil spills. WHAT CAN I DO IN THE BATHROOM?  Use night lights.  Install grab bars by the toilet and in the tub and shower. Do not use towel bars as grab bars.  Use non-skid mats or decals on the floor of the tub or shower.  If you need to sit down while you are in the shower, use a plastic, non-slip stool.[name]  Keep the floor dry. Immediately clean up any water that spills on the floor.  Remove soap buildup in the tub or shower on a regular basis.  Attach bath mats securely with double-sided non-slip rug tape.  Remove throw rugs and other tripping hazards from the floor. WHAT CAN I DO IN THE BEDROOM?  Use night lights.  Make sure that a bedside light is easy to reach.  Do not use oversized bedding that drapes onto the floor.  Have a firm chair that has side arms to use for getting dressed.  Remove throw rugs and other tripping hazards from the floor. WHAT CAN I DO IN THE KITCHEN?   Clean up any spills right away.  Avoid walking on wet floors.  Place frequently used items in easy-to-reach places.  If you need to reach for something above you, use a  sturdy step stool that has a grab bar.  Keep electrical cables out of the way.  Do not use floor polish or wax that makes floors slippery. If you have to use wax, make sure that it is non-skid floor wax.  Remove throw rugs and other tripping hazards from the floor. WHAT CAN I DO IN THE STAIRWAYS?  Do not leave any items on the stairs.  Make sure that there are handrails on both sides of the stairs. Fix handrails that are broken or loose. Make sure that handrails are as long as the stairways.  Check any carpeting to make sure that it is firmly attached to the stairs. Fix any carpet that is loose or worn.  Avoid having throw rugs at the top or bottom of stairways, or secure the rugs with carpet tape to prevent them from moving.  Make sure that you have a light switch at the top of the stairs and the bottom of the stairs. If you do not have them, have them installed. WHAT ARE SOME OTHER FALL PREVENTION TIPS?  Wear closed-toe shoes that fit well and support your feet. Wear shoes that have rubber soles or low heels.  When you use a stepladder, make sure that it is completely opened and that the sides are firmly locked. Have someone hold the ladder while you are using it. Do not climb a closed stepladder.  Add color or contrast paint or tape to grab bars and handrails in your home. Place contrasting color strips on the first and last steps.  Use mobility aids as needed, such as canes, walkers, scooters, and crutches.  Turn on lights if it is dark. Replace any light bulbs that burn out.  Set up furniture so that there are clear paths. Keep the furniture in the same spot.  Fix any uneven floor surfaces.  Choose a carpet design that does not hide the edge of steps of a stairway.  Be aware of any and all pets.  Review your medicines with your healthcare provider. Some medicines can cause dizziness or changes in blood pressure, which increase your risk of falling. Talk with your health  care provider about other ways that you can decrease your risk of falls. This may include working with a physical therapist or trainer to improve your strength, balance, and endurance.   This information is not intended to replace advice given to you by your health care provider. Make sure you discuss any questions you have with your health care provider.   Document Released: 03/22/2002 Document Revised: 08/16/2014 Document Reviewed: 05/06/2014 Elsevier Interactive Patient Education 2016 [place] K. Panosh M.D.

## 2016-02-09 NOTE — Progress Notes (Signed)
Pre visit review using our clinic review tool, if applicable. No additional management support is needed unless otherwise documented below in the visit note. 

## 2016-02-09 NOTE — Patient Instructions (Addendum)
Try to limit   Pain pill to night and 1/2    At a time when needed.       Some people. Get help with also  ALPHA LIPOIC ACID     600 mg per day  Was used in people with diabetic neuropathy also .   Fall prevention also . To continue   Can make  wellness visit with Wynetta Fines our health coach in 3-4 months and then  and  physical and med labs appointments  with me. In 6 months    Fall Prevention in the Home  Falls can cause injuries and can affect people from all age groups. There are many simple things that you can do to make your home safe and to help prevent falls. WHAT CAN I DO ON THE OUTSIDE OF MY HOME?  Regularly repair the edges of walkways and driveways and fix any cracks.  Remove high doorway thresholds.  Trim any shrubbery on the main path into your home.  Use bright outdoor lighting.  Clear walkways of debris and clutter, including tools and rocks.  Regularly check that handrails are securely fastened and in good repair. Both sides of any steps should have handrails.  Install guardrails along the edges of any raised decks or porches.  Have leaves, snow, and ice cleared regularly.  Use sand or salt on walkways during winter months.  In the garage, clean up any spills right away, including grease or oil spills. WHAT CAN I DO IN THE BATHROOM?  Use night lights.  Install grab bars by the toilet and in the tub and shower. Do not use towel bars as grab bars.  Use non-skid mats or decals on the floor of the tub or shower.  If you need to sit down while you are in the shower, use a plastic, non-slip stool.[name]  Keep the floor dry. Immediately clean up any water that spills on the floor.  Remove soap buildup in the tub or shower on a regular basis.  Attach bath mats securely with double-sided non-slip rug tape.  Remove throw rugs and other tripping hazards from the floor. WHAT CAN I DO IN THE BEDROOM?  Use night lights.  Make sure that a bedside light is easy to  reach.  Do not use oversized bedding that drapes onto the floor.  Have a firm chair that has side arms to use for getting dressed.  Remove throw rugs and other tripping hazards from the floor. WHAT CAN I DO IN THE KITCHEN?   Clean up any spills right away.  Avoid walking on wet floors.  Place frequently used items in easy-to-reach places.  If you need to reach for something above you, use a sturdy step stool that has a grab bar.  Keep electrical cables out of the way.  Do not use floor polish or wax that makes floors slippery. If you have to use wax, make sure that it is non-skid floor wax.  Remove throw rugs and other tripping hazards from the floor. WHAT CAN I DO IN THE STAIRWAYS?  Do not leave any items on the stairs.  Make sure that there are handrails on both sides of the stairs. Fix handrails that are broken or loose. Make sure that handrails are as long as the stairways.  Check any carpeting to make sure that it is firmly attached to the stairs. Fix any carpet that is loose or worn.  Avoid having throw rugs at the top or bottom of stairways,  or secure the rugs with carpet tape to prevent them from moving.  Make sure that you have a light switch at the top of the stairs and the bottom of the stairs. If you do not have them, have them installed. WHAT ARE SOME OTHER FALL PREVENTION TIPS?  Wear closed-toe shoes that fit well and support your feet. Wear shoes that have rubber soles or low heels.  When you use a stepladder, make sure that it is completely opened and that the sides are firmly locked. Have someone hold the ladder while you are using it. Do not climb a closed stepladder.  Add color or contrast paint or tape to grab bars and handrails in your home. Place contrasting color strips on the first and last steps.  Use mobility aids as needed, such as canes, walkers, scooters, and crutches.  Turn on lights if it is dark. Replace any light bulbs that burn out.  Set  up furniture so that there are clear paths. Keep the furniture in the same spot.  Fix any uneven floor surfaces.  Choose a carpet design that does not hide the edge of steps of a stairway.  Be aware of any and all pets.  Review your medicines with your healthcare provider. Some medicines can cause dizziness or changes in blood pressure, which increase your risk of falling. Talk with your health care provider about other ways that you can decrease your risk of falls. This may include working with a physical therapist or trainer to improve your strength, balance, and endurance.   This information is not intended to replace advice given to you by your health care provider. Make sure you discuss any questions you have with your health care provider.   Document Released: 03/22/2002 Document Revised: 08/16/2014 Document Reviewed: 05/06/2014 Elsevier Interactive Patient Education [employer].

## 2016-02-26 ENCOUNTER — Telehealth: Payer: Self-pay | Admitting: Internal Medicine

## 2016-02-26 NOTE — Telephone Encounter (Signed)
Pt need new for Hydrocodone  Pt is aware of the 3 day refill.

## 2016-02-27 MED ORDER — HYDROCODONE-ACETAMINOPHEN 10-325 MG PO TABS: ORAL_TABLET | ORAL | 0 refills | Status: DC

## 2016-02-27 NOTE — Telephone Encounter (Signed)
Printed for WP to sign. 

## 2016-02-27 NOTE — Telephone Encounter (Signed)
Pts wife is calling stating that the pt is 3 days over due for his Rx and he needs it today b/c he is in so much pain.

## 2016-02-27 NOTE — Telephone Encounter (Signed)
Ok to refill x 1  

## 2016-02-28 NOTE — Telephone Encounter (Signed)
Pt notified to pick up at the front desk. 

## 2016-03-18 ENCOUNTER — Telehealth: Payer: Self-pay | Admitting: Internal Medicine

## 2016-03-18 NOTE — Telephone Encounter (Signed)
Pt request refill  °HYDROcodone-acetaminophen (NORCO) 10-325 MG tablet °

## 2016-03-18 NOTE — Telephone Encounter (Signed)
Ok x 1

## 2016-03-19 MED ORDER — HYDROCODONE-ACETAMINOPHEN 10-325 MG PO TABS: ORAL_TABLET | ORAL | 0 refills | Status: DC

## 2016-03-19 NOTE — Telephone Encounter (Signed)
Pt notified to pick up at the front desk.  Also informed the pt that the office will be closing early.  Advised him to come by 2:30 PM today.

## 2016-04-09 ENCOUNTER — Telehealth: Payer: Self-pay | Admitting: Internal Medicine

## 2016-04-09 NOTE — Telephone Encounter (Signed)
Pt request refill  °HYDROcodone-acetaminophen (NORCO) 10-325 MG tablet °

## 2016-04-10 MED ORDER — HYDROCODONE-ACETAMINOPHEN 10-325 MG PO TABS: ORAL_TABLET | ORAL | 0 refills | Status: DC

## 2016-04-10 NOTE — Telephone Encounter (Signed)
Ok to refill x 1  

## 2016-04-10 NOTE — Telephone Encounter (Signed)
Pt notified to pick up at the front desk on 04/11/16 at 8 AM or after.

## 2016-04-17 ENCOUNTER — Emergency Department (HOSPITAL_COMMUNITY): Payer: Medicare HMO

## 2016-04-17 ENCOUNTER — Encounter (HOSPITAL_COMMUNITY): Payer: Self-pay | Admitting: Emergency Medicine

## 2016-04-17 ENCOUNTER — Inpatient Hospital Stay (HOSPITAL_COMMUNITY)
Admission: EM | Admit: 2016-04-17 | Discharge: 2016-04-19 | DRG: 069 | Disposition: A | Discharge status: Home Health | Payer: Medicare HMO | Attending: Internal Medicine | Admitting: Internal Medicine

## 2016-04-17 DIAGNOSIS — K8020 Calculus of gallbladder without cholecystitis without obstruction: Secondary | ICD-10-CM | POA: Diagnosis not present

## 2016-04-17 DIAGNOSIS — E785 Hyperlipidemia, unspecified: Secondary | ICD-10-CM | POA: Diagnosis not present

## 2016-04-17 DIAGNOSIS — R4701 Aphasia: Secondary | ICD-10-CM | POA: Diagnosis present

## 2016-04-17 DIAGNOSIS — Z8249 Family history of ischemic heart disease and other diseases of the circulatory system: Secondary | ICD-10-CM

## 2016-04-17 DIAGNOSIS — Z85828 Personal history of other malignant neoplasm of skin: Secondary | ICD-10-CM

## 2016-04-17 DIAGNOSIS — D649 Anemia, unspecified: Secondary | ICD-10-CM | POA: Diagnosis present

## 2016-04-17 DIAGNOSIS — I10 Essential (primary) hypertension: Secondary | ICD-10-CM | POA: Diagnosis not present

## 2016-04-17 DIAGNOSIS — I951 Orthostatic hypotension: Secondary | ICD-10-CM | POA: Diagnosis present

## 2016-04-17 DIAGNOSIS — I2510 Atherosclerotic heart disease of native coronary artery without angina pectoris: Secondary | ICD-10-CM | POA: Diagnosis present

## 2016-04-17 DIAGNOSIS — M1990 Unspecified osteoarthritis, unspecified site: Secondary | ICD-10-CM | POA: Diagnosis present

## 2016-04-17 DIAGNOSIS — R7989 Other specified abnormal findings of blood chemistry: Secondary | ICD-10-CM | POA: Diagnosis not present

## 2016-04-17 DIAGNOSIS — Z87891 Personal history of nicotine dependence: Secondary | ICD-10-CM | POA: Diagnosis not present

## 2016-04-17 DIAGNOSIS — R269 Unspecified abnormalities of gait and mobility: Secondary | ICD-10-CM | POA: Diagnosis not present

## 2016-04-17 DIAGNOSIS — D638 Anemia in other chronic diseases classified elsewhere: Secondary | ICD-10-CM | POA: Diagnosis present

## 2016-04-17 DIAGNOSIS — D696 Thrombocytopenia, unspecified: Secondary | ICD-10-CM | POA: Diagnosis present

## 2016-04-17 DIAGNOSIS — R945 Abnormal results of liver function studies: Secondary | ICD-10-CM

## 2016-04-17 DIAGNOSIS — G459 Transient cerebral ischemic attack, unspecified: Secondary | ICD-10-CM | POA: Diagnosis not present

## 2016-04-17 DIAGNOSIS — R4182 Altered mental status, unspecified: Secondary | ICD-10-CM | POA: Diagnosis present

## 2016-04-17 DIAGNOSIS — Z951 Presence of aortocoronary bypass graft: Secondary | ICD-10-CM | POA: Diagnosis not present

## 2016-04-17 DIAGNOSIS — I25810 Atherosclerosis of coronary artery bypass graft(s) without angina pectoris: Secondary | ICD-10-CM

## 2016-04-17 DIAGNOSIS — R4781 Slurred speech: Secondary | ICD-10-CM | POA: Diagnosis not present

## 2016-04-17 DIAGNOSIS — I6789 Other cerebrovascular disease: Secondary | ICD-10-CM | POA: Diagnosis not present

## 2016-04-17 LAB — I-STAT CHEM 8, ED
BUN: 19 mg/dL (ref 6–20)
CHLORIDE: 106 mmol/L (ref 101–111)
Calcium, Ion: 1.11 mmol/L — ABNORMAL LOW (ref 1.15–1.40)
Creatinine, Ser: 1.1 mg/dL (ref 0.61–1.24)
Glucose, Bld: 122 mg/dL — ABNORMAL HIGH (ref 65–99)
HEMATOCRIT: 36 % — AB (ref 39.0–52.0)
Hemoglobin: 12.2 g/dL — ABNORMAL LOW (ref 13.0–17.0)
Potassium: 4 mmol/L (ref 3.5–5.1)
SODIUM: 140 mmol/L (ref 135–145)
TCO2: 21 mmol/L (ref 0–100)

## 2016-04-17 LAB — RAPID URINE DRUG SCREEN, HOSP PERFORMED
AMPHETAMINES: NOT DETECTED
BENZODIAZEPINES: NOT DETECTED
Barbiturates: NOT DETECTED
COCAINE: NOT DETECTED
OPIATES: POSITIVE — AB
TETRAHYDROCANNABINOL: NOT DETECTED

## 2016-04-17 LAB — CBC
HCT: 36.5 % — ABNORMAL LOW (ref 39.0–52.0)
Hemoglobin: 12.1 g/dL — ABNORMAL LOW (ref 13.0–17.0)
MCH: 29.8 pg (ref 26.0–34.0)
MCHC: 33.2 g/dL (ref 30.0–36.0)
MCV: 89.9 fL (ref 78.0–100.0)
PLATELETS: 139 10*3/uL — AB (ref 150–400)
RBC: 4.06 MIL/uL — AB (ref 4.22–5.81)
RDW: 13.7 % (ref 11.5–15.5)
WBC: 4.8 10*3/uL (ref 4.0–10.5)

## 2016-04-17 LAB — I-STAT TROPONIN, ED: Troponin i, poc: 0.01 ng/mL (ref 0.00–0.08)

## 2016-04-17 LAB — URINALYSIS, ROUTINE W REFLEX MICROSCOPIC
Bilirubin Urine: NEGATIVE
Glucose, UA: NEGATIVE mg/dL
Ketones, ur: NEGATIVE mg/dL
Leukocytes, UA: NEGATIVE
Nitrite: NEGATIVE
PROTEIN: 30 mg/dL — AB
SPECIFIC GRAVITY, URINE: 1.019 (ref 1.005–1.030)
SQUAMOUS EPITHELIAL / LPF: NONE SEEN
pH: 7 (ref 5.0–8.0)

## 2016-04-17 LAB — COMPREHENSIVE METABOLIC PANEL
ALBUMIN: 3.4 g/dL — AB (ref 3.5–5.0)
ALK PHOS: 214 U/L — AB (ref 38–126)
ALT: 295 U/L — ABNORMAL HIGH (ref 17–63)
ANION GAP: 9 (ref 5–15)
AST: 589 U/L — ABNORMAL HIGH (ref 15–41)
BUN: 17 mg/dL (ref 6–20)
CALCIUM: 9.1 mg/dL (ref 8.9–10.3)
CHLORIDE: 108 mmol/L (ref 101–111)
CO2: 21 mmol/L — AB (ref 22–32)
Creatinine, Ser: 1.11 mg/dL (ref 0.61–1.24)
GFR calc Af Amer: >60 mL/min (ref >60)
GFR calc non Af Amer: 57 mL/min — ABNORMAL LOW (ref >60)
GLUCOSE: 119 mg/dL — AB (ref 65–99)
POTASSIUM: 4.1 mmol/L (ref 3.5–5.1)
SODIUM: 138 mmol/L (ref 135–145)
Total Bilirubin: 2.2 mg/dL — ABNORMAL HIGH (ref 0.3–1.2)
Total Protein: 6.1 g/dL — ABNORMAL LOW (ref 6.5–8.1)

## 2016-04-17 LAB — DIFFERENTIAL
BASOS PCT: 0 %
Basophils Absolute: 0 10*3/uL (ref 0.0–0.1)
EOS PCT: 0 %
Eosinophils Absolute: 0 10*3/uL (ref 0.0–0.7)
LYMPHS PCT: 16 %
Lymphs Abs: 0.7 10*3/uL (ref 0.7–4.0)
Monocytes Absolute: 0.5 10*3/uL (ref 0.1–1.0)
Monocytes Relative: 9 %
Neutro Abs: 3.6 10*3/uL (ref 1.7–7.7)
Neutrophils Relative %: 75 %

## 2016-04-17 LAB — PROTIME-INR
INR: 1.02
PROTHROMBIN TIME: 13.4 s (ref 11.4–15.2)

## 2016-04-17 LAB — ETHANOL: Alcohol, Ethyl (B): <5 mg/dL (ref <5)

## 2016-04-17 LAB — APTT: aPTT: 33 seconds (ref 24–36)

## 2016-04-17 NOTE — ED Provider Notes (Signed)
[facility] DEPT Provider Note   CSN: [id number] Arrival date & time: 04/17/16  2028     History   Chief Complaint Chief Complaint  Patient presents with  . Aphasia    HPI GERROD BILLUPS is a 83 y.o. male.  As per family patient with acute mental status change and difficulty finding the right words occurring around noon in lasting until about 6:30 PM this evening. Upon arrival here patient back to baseline. Patient has never had any history of dementia. No history of any prior strokes. Does have a history of hypertension and hyperlipidemia. Does have known coronary artery disease. As per family patient is now back to normal.      Past Medical History:  Diagnosis Date  . Arthritis   . CAD (coronary artery disease)   . History of stress test 09/20/2008  . Hx of echocardiogram 07/20/2010   The cavity size was normal, there is mild aortic stenosis,the aortic root was normal is size, the ascending aorta was normal in size.  [name] Hx of varicella   . Hyperlipemia   . Hypertension   . Orthostatic hypotension    on mitodrine per [place]  . Squamous cell skin cancer    pinna     Patient Active Problem List   Diagnosis Date Noted  . TIA (transient ischemic attack) 04/17/2016  . Senile ecchymosis 09/20/2014  . Hyperlipidemia 06/07/2014  . Primary osteoarthritis involving multiple joints 01/20/2014  . Coronary artery disease due to lipid rich plaque 01/20/2014  . Age factor 01/20/2014  . Medication side effect 01/20/2014  . Smokeless tobacco use within past 30 days 06/03/2013  . Skin abnormalities 05/18/2013  . Pain management 05/18/2013  . Abnormal TSH 05/18/2013  . Unspecified vitamin D deficiency 05/18/2013  . Viral URI with cough 11/04/2012  . Sleep difficulties 11/06/2011  . High risk medication use 04/10/2011  . Chronic orthostatic hypotension 05/16/2010  . Labyrinthitis 05/16/2010  . DIZZINESS 02/23/2009  . DEGENERATIVE JOINT DISEASE, SHOULDER 07/20/2008  .  ARTHRITIS, KNEES, BILATERAL 07/20/2008  . FROZEN RIGHT SHOULDER 07/20/2008  . Hypothyroidism 11/11/2007  . Hereditary and idiopathic peripheral neuropathy 11/11/2007  . CONSTIPATION, CHRONIC 11/11/2007  . OSTEOPENIA 11/11/2007  . GASTRIC ULCER, HX OF 11/11/2007  . HYPERLIPIDEMIA 03/31/2007  . HYPERTENSION 03/31/2007  . CORONARY ARTERY DISEASE 03/31/2007  . IBS 03/31/2007    Past Surgical History:  Procedure Laterality Date  . BACK SURGERY     15s1Repeat Foraminotomy  . cataract surgery     digby   . CORONARY ARTERY BYPASS GRAFT  04/1999   Dr Roxan Hockey, showing 4 vessel disease and underwent multivessel CABG aththat time.  . LUMBAR DISC SURGERY     L4-5  decompression  . microdisscectomy     bilateral  . OTHER SURGICAL HISTORY     central decompression       Home Medications    Prior to Admission medications   Medication Sig Start Date End Date Taking? Authorizing Provider  Cyanocobalamin (B-12 PO) Take 1 tablet by mouth 2 (two) times daily.    Yes Historical Provider, MD  HYDROcodone-acetaminophen (NORCO) 10-325 MG tablet Take 1 tablet by mouth as needed for pain.  Maximum of every 6 hours. 04/10/16  Yes Burnis Medin, MD  midodrine (PROAMATINE) 5 MG tablet Take 5 mg by mouth 2 (two) times daily with a meal. 1 tab  8AM, 1 PM and 1 at 6 PM- Pt gets [facility] the [facility]  08/16/10  Yes Historical Provider, MD  Family History Family History  Problem Relation Age of Onset  . Other Mother 92    brain aneursym  . Anuerysm Mother   . Heart attack Father     Social History Social History  Substance Use Topics  . Smoking status: Former Research scientist (life sciences)  . Smokeless tobacco: Current User    Types: Chew    Last attempt to quit: 04/15/1978  . Alcohol use No     Allergies   Gabapentin and Penicillins   Review of Systems Review of Systems  Constitutional: Negative for fever.  HENT: Negative for congestion.   Eyes: Negative for visual disturbance.  Respiratory: Negative for  shortness of breath.   Cardiovascular: Negative for chest pain.  Gastrointestinal: Negative for abdominal pain.  Genitourinary: Negative for dysuria.  Musculoskeletal: Negative for back pain and neck pain.  Skin: Negative for rash.  Neurological: Positive for speech difficulty.  Hematological: Does not bruise/bleed easily.  Psychiatric/Behavioral: Positive for confusion.     Physical Exam Updated Vital Signs BP 119/81   Pulse 72   Temp 99 F (37.2 C) (Oral)   Resp 16   Ht 5\' 9"  (1.753 m)   Wt 76.7 kg   SpO2 99%   BMI 24.96 kg/m   Physical Exam  Constitutional: He is oriented to person, place, and time. He appears well-developed and well-nourished. No distress.  HENT:  Head: Normocephalic and atraumatic.  Mouth/Throat: Oropharynx is clear and moist.  Eyes: Conjunctivae and EOM are normal. Pupils are equal, round, and reactive to light.  Neck: Normal range of motion. Neck supple.  Cardiovascular: Normal rate, regular rhythm and normal heart sounds.   Pulmonary/Chest: Effort normal and breath sounds normal. No respiratory distress.  Abdominal: Soft. Bowel sounds are normal. There is no tenderness.  Musculoskeletal: Normal range of motion.  Neurological: He is alert and oriented to person, place, and time. No cranial nerve deficit or sensory deficit. He exhibits normal muscle tone. Coordination normal.  Skin: Skin is warm.  Nursing note and vitals reviewed.    ED Treatments / Results  Labs (all labs ordered are listed, but only abnormal results are displayed) Labs Reviewed  CBC - Abnormal; Notable for the following:       Result Value   RBC 4.06 (*)    Hemoglobin 12.1 (*)    HCT 36.5 (*)    Platelets 139 (*)    All other components within normal limits  COMPREHENSIVE METABOLIC PANEL - Abnormal; Notable for the following:    CO2 21 (*)    Glucose, Bld 119 (*)    Total Protein 6.1 (*)    Albumin 3.4 (*)    AST 589 (*)    ALT 295 (*)    Alkaline Phosphatase 214  (*)    Total Bilirubin 2.2 (*)    GFR calc non Af Amer 57 (*)    All other components within normal limits  RAPID URINE DRUG SCREEN, HOSP PERFORMED - Abnormal; Notable for the following:    Opiates POSITIVE (*)    All other components within normal limits  URINALYSIS, ROUTINE W REFLEX MICROSCOPIC - Abnormal; Notable for the following:    Color, Urine AMBER (*)    APPearance HAZY (*)    Hgb urine dipstick SMALL (*)    Protein, ur 30 (*)    Bacteria, UA RARE (*)    All other components within normal limits  I-STAT CHEM 8, ED - Abnormal; Notable for the following:    Glucose, Bld 122 (*)  Calcium, Ion 1.11 (*)    Hemoglobin 12.2 (*)    HCT 36.0 (*)    All other components within normal limits  ETHANOL  PROTIME-INR  APTT  DIFFERENTIAL  I-STAT TROPOININ, ED    EKG  EKG Interpretation None       Radiology Ct Head Wo Contrast  Result Date: 04/17/2016 CLINICAL DATA:  Slurred speech and confusion. Stroke-like symptoms greater than 8 hours. EXAM: CT HEAD WITHOUT CONTRAST TECHNIQUE: Contiguous axial images were obtained from the base of the skull through the vertex without intravenous contrast. COMPARISON:  CT from 07/17/2010 FINDINGS: Brain: Motion related artifacts necessitating repeat imaging through portions of the brain. There sulcal and ventricular prominence consistent with moderate superficial and central atrophy. Chronic small vessel ischemic disease of periventricular white matter. No acute large vascular territory infarction, hemorrhage or midline shift. No extra-axial fluid collections. No effacement of the basal cisterns. Fourth ventricle is midline. Cerebellar atrophy noted as well. Vascular: Moderate carotid siphon calcifications. Moderate left vertebral arterial calcification. Skull: No acute osseous abnormality. Focal deviation of the mid nasal septum. Sinuses/Orbits: Nonacute Other: None IMPRESSION: Cerebral atrophy with moderate small vessel ischemic disease of  periventricular white matter. No acute intracranial abnormality. Electronically Signed   By: Ashley Royalty M.D.   On: 04/17/2016 22:00    Procedures Procedures (including critical care time)  CRITICAL CARE Performed by: Fredia Sorrow Total critical care time: 30 minutes Critical care time was exclusive of separately billable procedures and treating other patients. Critical care was necessary to treat or prevent imminent or life-threatening deterioration. Critical care was time spent personally by me on the following activities: development of treatment plan with patient and/or surrogate as well as nursing, discussions with consultants, evaluation of patient's response to treatment, examination of patient, obtaining history from patient or surrogate, ordering and performing treatments and interventions, ordering and review of laboratory studies, ordering and review of radiographic studies, pulse oximetry and re-evaluation of patient's condition.  The patient upon arrival evaluated as a code stroke potential for the aphasia.  Medications Ordered in ED Medications - No data to display   Initial Impression / Assessment and Plan / ED Course  I have reviewed the triage vital signs and the nursing notes.  Pertinent labs & imaging results that were available during my care of the patient were reviewed by me and considered in my medical decision making (see chart for details).  Clinical Course     The patient with mental status change between noon to 6:30 PM. Also had difficulty finding words to speak properly. That all resolved by the time I saw him. Based on the patient's daughter she witnessed all this is extremely unusual for him. Raises concern for TIA. They all agree that he is back to normal currently. Patient is mentating fine. Speech is fine.  Head CT negative for any acute findings labs without any significant abnormalities. Vital signs are normal. Patient does have risk factors of  hypertension hyperlipidemia and known coronary artery disease.  There was no syncope there was no complaint of chest pain.  Final Clinical Impressions(s) / ED Diagnoses   Final diagnoses:  Transient cerebral ischemia, unspecified type  Aphasia    New Prescriptions New Prescriptions   No medications on file     Fredia Sorrow, MD 04/17/16 2337

## 2016-04-17 NOTE — H&P (Addendum)
History and Physical    Austin Rose [id number] DOB: 1925/06/03 DOA: 04/17/2016  Referring MD/NP/PA: Dr. Rogene Houston PCP: Lottie Dawson, MD  Patient coming from: home  Chief Complaint: Difficulty getting his words out  HPI: Austin Rose is a 83 y.o. male with medical history significant of HTN, HLD, CAD s/p Cabg, orthostatic hypotension; who presents with difficulty getting his words out. Patient was last seen normal prior to noon today and had otherwise been in his normal state of health. Thereafter, family noted that he was somewhat confused and had difficulty forming his words. Son was present at bedside states that it was like he knew what he wanted to say just couldn't get his words out. Patient denies any headache, fever, chills, shortness of breath, cough, chest pain, palpitations, change in weight, focal weakness, change in vision, or recent medication changes. Associated symptoms include difficulty with balance which has progressively worsened over the last few months. At baseline patient ambulates with the use of a cane and has not had any issues with his memory.   ED Course: Upon admission to the emergency department patient was evaluated and seen to be afebrile, respirations up to 22, and all the vitals relatively within normal limits. Lab work low ionized calcium at 1.1, relatively unremarkable positive for opiates but otherwise  Review of Systems: As per HPI otherwise 10 point review of systems negative.   Past Medical History:  Diagnosis Date  . Arthritis   . CAD (coronary artery disease)   . History of stress test 09/20/2008  . Hx of echocardiogram 07/20/2010   The cavity size was normal, there is mild aortic stenosis,the aortic root was normal is size, the ascending aorta was normal in size.  [name] Hx of varicella   . Hyperlipemia   . Hypertension   . Orthostatic hypotension    on mitodrine per [place]  . Squamous cell skin cancer    pinna     Past Surgical  History:  Procedure Laterality Date  . BACK SURGERY     15s1Repeat Foraminotomy  . cataract surgery     digby   . CORONARY ARTERY BYPASS GRAFT  04/1999   Dr Roxan Hockey, showing 4 vessel disease and underwent multivessel CABG aththat time.  . LUMBAR DISC SURGERY     L4-5  decompression  . microdisscectomy     bilateral  . OTHER SURGICAL HISTORY     central decompression     reports that he has quit smoking. His smokeless tobacco use includes Chew. He reports that he does not drink alcohol or use drugs.  Allergies  Allergen Reactions  . Gabapentin Other (See Comments)    Behavior changes noted by family  Better when stopped  2015  . Penicillins Other (See Comments)    unknown    Family History  Problem Relation Age of Onset  . Other Mother 61    brain aneursym  . Anuerysm Mother   . Heart attack Father     Prior to Admission medications   Medication Sig Start Date End Date Taking? Authorizing Provider  Cyanocobalamin (B-12 PO) Take 1 tablet by mouth 2 (two) times daily.    Yes Historical Provider, MD  HYDROcodone-acetaminophen (NORCO) 10-325 MG tablet Take 1 tablet by mouth as needed for pain.  Maximum of every 6 hours. 04/10/16  Yes Burnis Medin, MD  midodrine (PROAMATINE) 5 MG tablet Take 5 mg by mouth 2 (two) times daily with a meal. 1 tab  8AM, 1 PM  and 1 at 6 PM- Pt gets thru the [place]  08/16/10  Yes Historical Provider, MD    Physical Exam:  Constitutional: NAD, calm, comfortable Vitals:   04/17/16 2029 04/17/16 2030 04/17/16 2032 04/17/16 2033  BP:  151/67 119/81   Pulse:  65 72   Resp:  22 16   Temp:  99 F (37.2 C) 99 F (37.2 C)   TempSrc:  Oral Oral   SpO2: 96% 99% 99%   Weight:    76.7 kg (169 lb)  Height:    5\' 9"  (1.753 m)   Eyes: PERRL, lids and conjunctivae normal ENMT: Mucous membranes are dry. Posterior pharynx clear of any exudate or lesions. Edentulous.  Neck: normal, supple, no masses, no thyromegaly Respiratory: clear to auscultation  bilaterally, no wheezing, no crackles. Normal respiratory effort. No accessory muscle use.  Cardiovascular: Regular rate and rhythm, no murmurs / rubs / gallops. No extremity edema. 2+ pedal pulses. No carotid bruits.  Abdomen: no tenderness, no masses palpated. No hepatosplenomegaly. Bowel sounds positive.  Musculoskeletal: no clubbing / cyanosis. No joint deformity upper and lower extremities. Good ROM, no contractures. Normal muscle tone.  Skin: no rashes, lesions, ulcers. No induration Neurologic: CN 2-12 grossly intact. Sensation intact, DTR normal. Strength 5/5 in all 4.  Difficulty word finding Psychiatric: Normal judgment and insight. Alert and oriented x 3. Normal mood.     Labs on Admission: I have personally reviewed following labs and imaging studies  CBC:  Recent Labs Lab 04/17/16 2137 04/17/16 2145  WBC 4.8  --   NEUTROABS 3.6  --   HGB 12.1* 12.2*  HCT 36.5* 36.0*  MCV 89.9  --   PLT 139*  --    Basic Metabolic Panel:  Recent Labs Lab 04/17/16 2137 04/17/16 2145  NA 138 140  K 4.1 4.0  CL 108 106  CO2 21*  --   GLUCOSE 119* 122*  BUN 17 19  CREATININE 1.11 1.10  CALCIUM 9.1  --    GFR: Estimated Creatinine Clearance: 46.4 mL/min (by C-G formula based on SCr of 1.1 mg/dL). Liver Function Tests:  Recent Labs Lab 04/17/16 2137  AST 589*  ALT 295*  ALKPHOS 214*  BILITOT 2.2*  PROT 6.1*  ALBUMIN 3.4*   No results for input(s): LIPASE, AMYLASE in the last 168 hours. No results for input(s): AMMONIA in the last 168 hours. Coagulation Profile:  Recent Labs Lab 04/17/16 2137  INR 1.02   Cardiac Enzymes: No results for input(s): CKTOTAL, CKMB, CKMBINDEX, TROPONINI in the last 168 hours. BNP (last 3 results) No results for input(s): PROBNP in the last 8760 hours. HbA1C: No results for input(s): HGBA1C in the last 72 hours. CBG: No results for input(s): GLUCAP in the last 168 hours. Lipid Profile: No results for input(s): CHOL, HDL, LDLCALC,  TRIG, CHOLHDL, LDLDIRECT in the last 72 hours. Thyroid Function Tests: No results for input(s): TSH, T4TOTAL, FREET4, T3FREE, THYROIDAB in the last 72 hours. Anemia Panel: No results for input(s): VITAMINB12, FOLATE, FERRITIN, TIBC, IRON, RETICCTPCT in the last 72 hours. Urine analysis:    Component Value Date/Time   COLORURINE AMBER (A) 04/17/2016 2215   APPEARANCEUR HAZY (A) 04/17/2016 2215   LABSPEC 1.019 04/17/2016 2215   PHURINE 7.0 04/17/2016 2215   GLUCOSEU NEGATIVE 04/17/2016 2215   HGBUR SMALL (A) 04/17/2016 2215   HGBUR negative 11/17/2009 1342   BILIRUBINUR NEGATIVE 04/17/2016 2215   BILIRUBINUR n 05/15/2015 1445   KETONESUR NEGATIVE 04/17/2016 2215   PROTEINUR  30 (A) 04/17/2016 2215   UROBILINOGEN 1.0 05/15/2015 1445   UROBILINOGEN 0.2 07/17/2010 1338   NITRITE NEGATIVE 04/17/2016 2215   LEUKOCYTESUR NEGATIVE 04/17/2016 2215   Sepsis Labs: No results found for this or any previous visit (from the past 240 hour(s)).   Radiological Exams on Admission: Ct Head Wo Contrast  Result Date: 04/17/2016 CLINICAL DATA:  Slurred speech and confusion. Stroke-like symptoms greater than 8 hours. EXAM: CT HEAD WITHOUT CONTRAST TECHNIQUE: Contiguous axial images were obtained from the base of the skull through the vertex without intravenous contrast. COMPARISON:  CT from 07/17/2010 FINDINGS: Brain: Motion related artifacts necessitating repeat imaging through portions of the brain. There sulcal and ventricular prominence consistent with moderate superficial and central atrophy. Chronic small vessel ischemic disease of periventricular white matter. No acute large vascular territory infarction, hemorrhage or midline shift. No extra-axial fluid collections. No effacement of the basal cisterns. Fourth ventricle is midline. Cerebellar atrophy noted as well. Vascular: Moderate carotid siphon calcifications. Moderate left vertebral arterial calcification. Skull: No acute osseous abnormality. Focal  deviation of the mid nasal septum. Sinuses/Orbits: Nonacute Other: None IMPRESSION: Cerebral atrophy with moderate small vessel ischemic disease of periventricular white matter. No acute intracranial abnormality. Electronically Signed   By: Ashley Royalty M.D.   On: 04/17/2016 22:00      Assessment/Plan Expressive aphasia 2/2 suspected TIA (Transient ischemic attack)/Stroke: Acute. Patient with acute onset of difficulty word finding since noon this morning. CT imaging of the brain negative for any acute abnormalities.  - Admit to telemetry bed - TIA/Stroke order set initiated - Check MRI/MRA stat  And will consult neurology if found to be abnormal - Check echocardiogram and carotid vascular duplex - Check hemoglobin A1c, tsh, lipid panel in am -  PT/OT/speech to eval and treat - Follow-up EKG - Aspirin  - Allow for permissive hypertension  Gait disturbance - As seen above   CAD s/p Cabg: Patient reports that he had a CABG over 15 years ago   Anemia: Hemoglobin 12.1 on admission with no reported signs of bleeding - Continue to monitor   History of orthostatic hypotension: Blood pressures maintained. - Continue Midodrine  DVT prophylaxis: Lovenox Code Status: Full Family Communication: Discussed plan of care with the patient and his son is present at bedside Disposition Plan: Likely discharge home if medically stable Consults called: none Admission status: Observation   Norval Morton MD [facility] Pager [phone]  If 7PM-7AM, please contact night-coverage [email or web address] Password [email or web address]  04/17/2016, 11:35 PM

## 2016-04-17 NOTE — ED Notes (Signed)
Pt to CT

## 2016-04-17 NOTE — ED Triage Notes (Signed)
Ems pt from home, family reported pt was having difficulty forming words and seemed a little confused. EMS reports stroke screen Negative Pt denies pain

## 2016-04-17 NOTE — ED Notes (Signed)
Pt placed in brief for pt comfort, states bowels move freely and fearful that his clothing would get soiled.

## 2016-04-18 ENCOUNTER — Observation Stay (HOSPITAL_COMMUNITY): Payer: Medicare HMO

## 2016-04-18 ENCOUNTER — Encounter (HOSPITAL_COMMUNITY): Payer: Self-pay | Admitting: *Deleted

## 2016-04-18 ENCOUNTER — Observation Stay (HOSPITAL_BASED_OUTPATIENT_CLINIC_OR_DEPARTMENT_OTHER): Payer: Medicare HMO

## 2016-04-18 DIAGNOSIS — R4701 Aphasia: Secondary | ICD-10-CM | POA: Diagnosis present

## 2016-04-18 DIAGNOSIS — D649 Anemia, unspecified: Secondary | ICD-10-CM | POA: Diagnosis present

## 2016-04-18 DIAGNOSIS — R7989 Other specified abnormal findings of blood chemistry: Secondary | ICD-10-CM | POA: Diagnosis present

## 2016-04-18 DIAGNOSIS — E785 Hyperlipidemia, unspecified: Secondary | ICD-10-CM | POA: Diagnosis present

## 2016-04-18 DIAGNOSIS — M1990 Unspecified osteoarthritis, unspecified site: Secondary | ICD-10-CM | POA: Diagnosis present

## 2016-04-18 DIAGNOSIS — I10 Essential (primary) hypertension: Secondary | ICD-10-CM | POA: Diagnosis present

## 2016-04-18 DIAGNOSIS — G459 Transient cerebral ischemic attack, unspecified: Secondary | ICD-10-CM | POA: Diagnosis not present

## 2016-04-18 DIAGNOSIS — Z85828 Personal history of other malignant neoplasm of skin: Secondary | ICD-10-CM | POA: Diagnosis not present

## 2016-04-18 DIAGNOSIS — Z951 Presence of aortocoronary bypass graft: Secondary | ICD-10-CM | POA: Diagnosis not present

## 2016-04-18 DIAGNOSIS — I2510 Atherosclerotic heart disease of native coronary artery without angina pectoris: Secondary | ICD-10-CM | POA: Diagnosis present

## 2016-04-18 DIAGNOSIS — R4182 Altered mental status, unspecified: Secondary | ICD-10-CM | POA: Diagnosis present

## 2016-04-18 DIAGNOSIS — D696 Thrombocytopenia, unspecified: Secondary | ICD-10-CM | POA: Diagnosis present

## 2016-04-18 DIAGNOSIS — Z87891 Personal history of nicotine dependence: Secondary | ICD-10-CM | POA: Diagnosis not present

## 2016-04-18 DIAGNOSIS — I951 Orthostatic hypotension: Secondary | ICD-10-CM | POA: Diagnosis present

## 2016-04-18 DIAGNOSIS — R269 Unspecified abnormalities of gait and mobility: Secondary | ICD-10-CM | POA: Diagnosis present

## 2016-04-18 DIAGNOSIS — I25810 Atherosclerosis of coronary artery bypass graft(s) without angina pectoris: Secondary | ICD-10-CM

## 2016-04-18 DIAGNOSIS — Z8249 Family history of ischemic heart disease and other diseases of the circulatory system: Secondary | ICD-10-CM | POA: Diagnosis not present

## 2016-04-18 DIAGNOSIS — D638 Anemia in other chronic diseases classified elsewhere: Secondary | ICD-10-CM | POA: Diagnosis present

## 2016-04-18 LAB — ECHOCARDIOGRAM COMPLETE
Height: 69 in
Weight: 2518.54 oz

## 2016-04-18 LAB — LIPID PANEL
Cholesterol: 88 mg/dL (ref 0–200)
HDL: 37 mg/dL — ABNORMAL LOW (ref >40)
LDL CALC: 43 mg/dL (ref 0–99)
Total CHOL/HDL Ratio: 2.4 RATIO
Triglycerides: 41 mg/dL (ref <150)
VLDL: 8 mg/dL (ref 0–40)

## 2016-04-18 LAB — CBC
HEMATOCRIT: 35.5 % — AB (ref 39.0–52.0)
Hemoglobin: 11.8 g/dL — ABNORMAL LOW (ref 13.0–17.0)
MCH: 30 pg (ref 26.0–34.0)
MCHC: 33.2 g/dL (ref 30.0–36.0)
MCV: 90.3 fL (ref 78.0–100.0)
Platelets: 121 10*3/uL — ABNORMAL LOW (ref 150–400)
RBC: 3.93 MIL/uL — AB (ref 4.22–5.81)
RDW: 13.8 % (ref 11.5–15.5)
WBC: 4.9 10*3/uL (ref 4.0–10.5)

## 2016-04-18 LAB — TSH: TSH: 2.101 u[IU]/mL (ref 0.350–4.500)

## 2016-04-18 MED ORDER — HYDROCODONE-ACETAMINOPHEN 10-325 MG PO TABS
1.0000 | ORAL_TABLET | Freq: Four times a day (QID) | ORAL | Status: DC | PRN
  Administered 2016-04-18 (×2): 1 via ORAL
  Filled 2016-04-18 (×2): qty 1

## 2016-04-18 MED ORDER — CYANOCOBALAMIN 250 MCG PO TABS
250.0000 ug | ORAL_TABLET | Freq: Two times a day (BID) | ORAL | Status: DC
  Administered 2016-04-18 (×2): 250 ug via ORAL
  Filled 2016-04-18 (×3): qty 1

## 2016-04-18 MED ORDER — STROKE: EARLY STAGES OF RECOVERY BOOK
Freq: Once | Status: AC
  Administered 2016-04-18: 02:00:00
  Filled 2016-04-18: qty 1

## 2016-04-18 MED ORDER — SENNOSIDES-DOCUSATE SODIUM 8.6-50 MG PO TABS
1.0000 | ORAL_TABLET | Freq: Every evening | ORAL | Status: DC | PRN
  Filled 2016-04-18: qty 1

## 2016-04-18 MED ORDER — ACETAMINOPHEN 650 MG RE SUPP: 650.0000 mg | RECTAL | Status: DC | PRN

## 2016-04-18 MED ORDER — SODIUM CHLORIDE 0.9 % IV SOLN
INTRAVENOUS | Status: DC
  Administered 2016-04-18: 02:00:00 via INTRAVENOUS

## 2016-04-18 MED ORDER — ACETAMINOPHEN 325 MG PO TABS: 650.0000 mg | ORAL_TABLET | ORAL | Status: DC | PRN

## 2016-04-18 MED ORDER — ENOXAPARIN SODIUM 40 MG/0.4ML ~~LOC~~ SOLN
40.0000 mg | SUBCUTANEOUS | Status: DC
  Administered 2016-04-18 – 2016-04-19 (×2): 40 mg via SUBCUTANEOUS
  Filled 2016-04-18 (×3): qty 0.4

## 2016-04-18 MED ORDER — ASPIRIN 325 MG PO TABS
325.0000 mg | ORAL_TABLET | Freq: Every day | ORAL | Status: DC
  Administered 2016-04-18 – 2016-04-19 (×2): 325 mg via ORAL
  Filled 2016-04-18 (×2): qty 1

## 2016-04-18 MED ORDER — MIDODRINE HCL 5 MG PO TABS
5.0000 mg | ORAL_TABLET | Freq: Three times a day (TID) | ORAL | Status: DC
  Administered 2016-04-18 – 2016-04-19 (×4): 5 mg via ORAL
  Filled 2016-04-18 (×5): qty 1

## 2016-04-18 MED ORDER — ASPIRIN 300 MG RE SUPP: 300.0000 mg | Freq: Every day | RECTAL | Status: DC

## 2016-04-18 MED ORDER — ACETAMINOPHEN 160 MG/5ML PO SOLN
650.0000 mg | ORAL | Status: DC | PRN
Start: 2016-04-18 — End: 2016-04-19

## 2016-04-18 NOTE — Evaluation (Signed)
SLP Cancellation Note  Patient Details Name: Austin Rose MRN: [id number] DOB: 03/03/1926   Cancelled treatment:       Reason Eval/Treat Not Completed: Other (comment) (pt at procedure)   Luanna Salk, [place] [facility] SLP [phone]

## 2016-04-18 NOTE — Progress Notes (Signed)
PROGRESS NOTE    Austin Rose  [id number] DOB: 08-28-1925 DOA: 04/17/2016 PCP: Lottie Dawson, MD   Outpatient Specialists:     Brief Narrative:   Austin Rose is a 83 y.o. male with medical history significant of HTN, HLD, CAD s/p Cabg, orthostatic hypotension; who presents with difficulty getting his words out. Patient was last seen normal prior to noon today and had otherwise been in his normal state of health. Thereafter, family noted that he was somewhat confused and had difficulty forming his words. Son was present at bedside states that it was like he knew what he wanted to say just couldn't get his words out. Patient denies any headache, fever, chills, shortness of breath, cough, chest pain, palpitations, change in weight, focal weakness, change in vision, or recent medication changes. Associated symptoms include difficulty with balance which has progressively worsened over the last few months. At baseline patient ambulates with the use of a cane.   Assessment & Plan:   Principal Problem:   Expressive aphasia Active Problems:   Chronic orthostatic hypotension   TIA (transient ischemic attack)   CAD (coronary artery disease) of artery bypass graft   Expressive aphasia 2/2 suspected TIA (Transient ischemic attack) CT imaging of the brain negative for any acute abnormalities. - MRI: MRI HEAD: No acute intracranial process. Old RIGHT basal ganglia and thalamus lacunar infarcts. Mild to moderate chronic small vessel ischemic disease. MRA HEAD: Moderately motion degraded examination without acute large vessel occlusion or severe stenosis.  - echocardiogram : Compared to a prior study in 2012, the LVEF is lower at 50-55%   with inferior hypokinesis. The LV wall thickness is normal with   moderate dilation of the LV. There is now moderate aortic   stenosis with an AVA of 1.2 cm2 - carotid vascular duplex: < 39% - hemoglobin A1c pending, tsh: 2.101, lipid panel:  LDL 43 -  PT/OT/speech to eval and treat - Aspirin  - Allow for permissive hypertension  Gait disturbance - PT Eval  CAD s/p Cabg: Patient reports that he had a CABG over 15 years ago   Anemia: Hemoglobin 12.1 on admission with no reported signs of bleeding - Continue to monitor   History of orthostatic hypotension: Blood pressures maintained. - Continue Midodrine  Elevated LFTS -repeat in AM -RUQ U/S -viral hepatitis panel  DVT prophylaxis:  Lovenox   Code Status: Full Code   Family Communication: At bedside  Disposition Plan:  PT/OT eval pending   Consultants:        Subjective: Says he is talking just fine-- family at bedside disagrees   Objective: Vitals:   04/18/16 0830 04/18/16 1054 04/18/16 1230 04/18/16 1439  BP: (!) 143/85 (!) 144/82 115/74 (!) 140/91  Pulse: (!) 51 (!) 49 (!) 54 63  Resp: 18 16 16 18   Temp: 97.9 F (36.6 C) 97.7 F (36.5 C) 97.9 F (36.6 C) 98.4 F (36.9 C)  TempSrc: Oral Oral Oral Oral  SpO2: 97% 100% 99% 99%  Weight:      Height:       No intake or output data in the 24 hours ending 04/18/16 1458 Filed Weights   04/17/16 2033 04/18/16 0030  Weight: 76.7 kg (169 lb) 71.4 kg (157 lb 6.5 oz)    Examination:  General exam: Appears calm and comfortable  Respiratory system: Clear to auscultation. Respiratory effort normal. Cardiovascular system: S1 & S2 heard, RRR. No JVD, murmurs, rubs, gallops or clicks. No pedal edema. Gastrointestinal system: Abdomen  is nondistended, soft and nontender. No organomegaly or masses felt. Normal bowel sounds heard. Central nervous system: Alert-- eating lunch    Data Reviewed: I have personally reviewed following labs and imaging studies  CBC:  Recent Labs Lab 04/17/16 2137 04/17/16 2145 04/18/16 0721  WBC 4.8  --  4.9  NEUTROABS 3.6  --   --   HGB 12.1* 12.2* 11.8*  HCT 36.5* 36.0* 35.5*  MCV 89.9  --  90.3  PLT 139*  --  123XX123*   Basic Metabolic Panel:  Recent  Labs Lab 04/17/16 2137 04/17/16 2145  NA 138 140  K 4.1 4.0  CL 108 106  CO2 21*  --   GLUCOSE 119* 122*  BUN 17 19  CREATININE 1.11 1.10  CALCIUM 9.1  --    GFR: Estimated Creatinine Clearance: 46.4 mL/min (by C-G formula based on SCr of 1.1 mg/dL). Liver Function Tests:  Recent Labs Lab 04/17/16 2137  AST 589*  ALT 295*  ALKPHOS 214*  BILITOT 2.2*  PROT 6.1*  ALBUMIN 3.4*   No results for input(s): LIPASE, AMYLASE in the last 168 hours. No results for input(s): AMMONIA in the last 168 hours. Coagulation Profile:  Recent Labs Lab 04/17/16 2137  INR 1.02   Cardiac Enzymes: No results for input(s): CKTOTAL, CKMB, CKMBINDEX, TROPONINI in the last 168 hours. BNP (last 3 results) No results for input(s): PROBNP in the last 8760 hours. HbA1C: No results for input(s): HGBA1C in the last 72 hours. CBG: No results for input(s): GLUCAP in the last 168 hours. Lipid Profile:  Recent Labs  04/18/16 0721  CHOL 88  HDL 37*  LDLCALC 43  TRIG 41  CHOLHDL 2.4   Thyroid Function Tests:  Recent Labs  04/18/16 0721  TSH 2.101   Anemia Panel: No results for input(s): VITAMINB12, FOLATE, FERRITIN, TIBC, IRON, RETICCTPCT in the last 72 hours. Urine analysis:    Component Value Date/Time   COLORURINE AMBER (A) 04/17/2016 2215   APPEARANCEUR HAZY (A) 04/17/2016 2215   LABSPEC 1.019 04/17/2016 2215   PHURINE 7.0 04/17/2016 2215   GLUCOSEU NEGATIVE 04/17/2016 2215   HGBUR SMALL (A) 04/17/2016 2215   HGBUR negative 11/17/2009 1342   BILIRUBINUR NEGATIVE 04/17/2016 2215   BILIRUBINUR n 05/15/2015 1445   KETONESUR NEGATIVE 04/17/2016 2215   PROTEINUR 30 (A) 04/17/2016 2215   UROBILINOGEN 1.0 05/15/2015 1445   UROBILINOGEN 0.2 07/17/2010 1338   NITRITE NEGATIVE 04/17/2016 2215   LEUKOCYTESUR NEGATIVE 04/17/2016 2215     )No results found for this or any previous visit (from the past 240 hour(s)).    Anti-infectives    None       Radiology Studies: Ct  Head Wo Contrast  Result Date: 04/17/2016 CLINICAL DATA:  Slurred speech and confusion. Stroke-like symptoms greater than 8 hours. EXAM: CT HEAD WITHOUT CONTRAST TECHNIQUE: Contiguous axial images were obtained from the base of the skull through the vertex without intravenous contrast. COMPARISON:  CT from 07/17/2010 FINDINGS: Brain: Motion related artifacts necessitating repeat imaging through portions of the brain. There sulcal and ventricular prominence consistent with moderate superficial and central atrophy. Chronic small vessel ischemic disease of periventricular white matter. No acute large vascular territory infarction, hemorrhage or midline shift. No extra-axial fluid collections. No effacement of the basal cisterns. Fourth ventricle is midline. Cerebellar atrophy noted as well. Vascular: Moderate carotid siphon calcifications. Moderate left vertebral arterial calcification. Skull: No acute osseous abnormality. Focal deviation of the mid nasal septum. Sinuses/Orbits: Nonacute Other: None IMPRESSION: Cerebral  atrophy with moderate small vessel ischemic disease of periventricular white matter. No acute intracranial abnormality. Electronically Signed   By: Ashley Royalty M.D.   On: 04/17/2016 22:00   Mr Brain Wo Contrast  Result Date: 04/18/2016 : Altered mental status, word-finding difficulties beginning at noon, returned to baseline 6-1/2 hours later. History of hypertension, hyperlipidemia. EXAM: MRI HEAD WITHOUT CONTRAST MRA HEAD WITHOUT CONTRAST TECHNIQUE: Multiplanar, multiecho pulse sequences of the brain and surrounding structures were obtained without intravenous contrast. Angiographic images of the head were obtained using MRA technique without contrast. COMPARISON:  CT HEAD April 17, 2016 FINDINGS: MRI HEAD FINDINGS- Mild motion degraded examination. BRAIN: No reduced diffusion to suggest acute ischemia. No susceptibility artifact to suggest hemorrhage. The ventricles and sulci are normal for  patient's age. No suspicious parenchymal signal, masses or mass effect. Patchy supratentorial white matter FLAIR T2 hyperintensities compatible with mild to moderate chronic small vessel ischemic disease, less than expected for age. Old RIGHT thalamus and RIGHT basal ganglia lacunar infarcts. No abnormal extra-axial fluid collections. No extra-axial masses though, contrast enhanced sequences would be more sensitive. VASCULAR: Normal major intracranial vascular flow voids present at skull base. SKULL AND UPPER CERVICAL SPINE: No abnormal sellar expansion. No suspicious calvarial bone marrow signal. Craniocervical junction maintained. SINUSES/ORBITS: The mastoid air-cells and included paranasal sinuses are well-aerated. Status post bilateral ocular lens implants. The included ocular globes and orbital contents are non-suspicious. OTHER: Patient is edentulous. MRA HEAD FINDINGS- moderately motion degraded examination, resulting in duplicated appearing vessels. ANTERIOR CIRCULATION: Asymmetrically decreased flow related enhancement RIGHT internal carotid artery with normal caliber most compatible with artifact. Anterior middle cerebral arteries are patent. Nondiagnostic for aneurysm or luminal regularity. POSTERIOR CIRCULATION: LEFT vertebral artery is dominant. Flow related enhancement bilateral vertebral arteries, basilar artery and main branch vessels. Patent bilateral posterior cerebral arteries. Nondiagnostic assessment for aneurysm or luminal irregularity. IMPRESSION: MRI HEAD: No acute intracranial process. Old RIGHT basal ganglia and thalamus lacunar infarcts. Mild to moderate chronic small vessel ischemic disease. MRA HEAD: Moderately motion degraded examination without acute large vessel occlusion or severe stenosis. Electronically Signed   By: Elon Alas M.D.   On: 04/18/2016 03:06   Mr [name] Head/brain [id number] Cm  Result Date: 04/18/2016 : Altered mental status, word-finding difficulties beginning at  noon, returned to baseline 6-1/2 hours later. History of hypertension, hyperlipidemia. EXAM: MRI HEAD WITHOUT CONTRAST MRA HEAD WITHOUT CONTRAST TECHNIQUE: Multiplanar, multiecho pulse sequences of the brain and surrounding structures were obtained without intravenous contrast. Angiographic images of the head were obtained using MRA technique without contrast. COMPARISON:  CT HEAD April 17, 2016 FINDINGS: MRI HEAD FINDINGS- Mild motion degraded examination. BRAIN: No reduced diffusion to suggest acute ischemia. No susceptibility artifact to suggest hemorrhage. The ventricles and sulci are normal for patient's age. No suspicious parenchymal signal, masses or mass effect. Patchy supratentorial white matter FLAIR T2 hyperintensities compatible with mild to moderate chronic small vessel ischemic disease, less than expected for age. Old RIGHT thalamus and RIGHT basal ganglia lacunar infarcts. No abnormal extra-axial fluid collections. No extra-axial masses though, contrast enhanced sequences would be more sensitive. VASCULAR: Normal major intracranial vascular flow voids present at skull base. SKULL AND UPPER CERVICAL SPINE: No abnormal sellar expansion. No suspicious calvarial bone marrow signal. Craniocervical junction maintained. SINUSES/ORBITS: The mastoid air-cells and included paranasal sinuses are well-aerated. Status post bilateral ocular lens implants. The included ocular globes and orbital contents are non-suspicious. OTHER: Patient is edentulous. MRA HEAD FINDINGS- moderately motion degraded examination, resulting in duplicated  appearing vessels. ANTERIOR CIRCULATION: Asymmetrically decreased flow related enhancement RIGHT internal carotid artery with normal caliber most compatible with artifact. Anterior middle cerebral arteries are patent. Nondiagnostic for aneurysm or luminal regularity. POSTERIOR CIRCULATION: LEFT vertebral artery is dominant. Flow related enhancement bilateral vertebral arteries, basilar  artery and main branch vessels. Patent bilateral posterior cerebral arteries. Nondiagnostic assessment for aneurysm or luminal irregularity. IMPRESSION: MRI HEAD: No acute intracranial process. Old RIGHT basal ganglia and thalamus lacunar infarcts. Mild to moderate chronic small vessel ischemic disease. MRA HEAD: Moderately motion degraded examination without acute large vessel occlusion or severe stenosis. Electronically Signed   By: Elon Alas M.D.   On: 04/18/2016 03:06        Scheduled Meds: . aspirin  300 mg Rectal Daily   Or  . aspirin  325 mg Oral Daily  . enoxaparin (LOVENOX) injection  40 mg Subcutaneous Q24H  . midodrine  5 mg Oral TID WC  . vitamin B-12  250 mcg Oral BID   Continuous Infusions: . sodium chloride 75 mL/hr at 04/18/16 0324     LOS: 0 days    Time spent: 35 min    [place], DO [facility] Pager [phone]  If 7PM-7AM, please contact night-coverage [email or web address] Password [email or web address] 04/18/2016, 2:58 PM

## 2016-04-18 NOTE — Progress Notes (Signed)
Pt off the floor for a test at the time of his 1030 vital sign and neuro check.  Will assess when the patient returns.  Will continue to monitor.  Cori Razor, RN

## 2016-04-18 NOTE — Evaluation (Signed)
Physical Therapy Evaluation Patient Details Name: Austin Rose MRN: [id number] DOB: August 01, 1925 Today's Date: 04/18/2016   History of Present Illness  Pt admitted with confusion and difficulty with word finding. CT and MRI negative for acute changes. PMH: HTN, HLD, CAD with CABG, orthostatic hypotension, back surgery.  Pt dx with TIA  Clinical Impression  Pt is close to his mobility baseline.  He does continue to have some word retrieval issues.  PT will follow acutely, but pt politely declined HHPT f/u at discharge.   PT to follow acutely for deficits listed below.       Follow Up Recommendations No PT follow up    Equipment Recommendations  None recommended by PT    Recommendations for Other Services   NA     Precautions / Restrictions Precautions Precautions: Fall Precaution Comments: Pt is reliant on both hands for support during gait.   Restrictions Weight Bearing Restrictions: No      Mobility  Bed Mobility Overal bed mobility: Needs Assistance Bed Mobility: Supine to Sit;Sit to Supine     Supine to sit: Min assist Sit to supine: Supervision   General bed mobility comments: Min hand held assist to pull to sitting EOB.  Pt able to get bil legs back into the bed unassisted.   Transfers Overall transfer level: Needs assistance Equipment used: 1 person hand held assist Transfers: Sit to/from Stand Sit to Stand: Min guard         General transfer comment: Min guard assist for steadiness during transitions.    Ambulation/Gait Ambulation/Gait assistance: Min guard Ambulation Distance (Feet): 200 Feet Assistive device:  (IV pole and hallway rail) Gait Pattern/deviations: Step-through pattern;Staggering left;Staggering right Gait velocity: decreased   General Gait Details: mildly staggering gait pattern, pt liking to have support (light) of bil hands on support surfaces as he walks.       Modified Rankin (Stroke Patients Only) Modified Rankin (Stroke  Patients Only) Pre-Morbid Rankin Score: Moderate disability Modified Rankin: Moderately severe disability     Balance Overall balance assessment: Needs assistance Sitting-balance support: Feet supported Sitting balance-Leahy Scale: Good     Standing balance support: No upper extremity supported;Bilateral upper extremity supported;Single extremity supported Standing balance-Leahy Scale: Poor                               Pertinent Vitals/Pain Pain Assessment: No/denies pain    Home Living Family/patient expects to be discharged to:: Private residence Living Arrangements: Spouse/significant other Available Help at Discharge: Family;Available 24 hours/day Type of Home: Mobile home Home Access: Ramped entrance     Home Layout: One level Home Equipment: [place] - 2 wheels;Shower seat;Grab bars - tub/shower (pt uses a tobacco stick as a walking cane) Additional Comments: Pt still mows his grass and uses a weed eater for his lawn (he rides the lawn mover to where he needs to weed eat, does a little area, sits down and drives to the next area).     Prior Function Level of Independence: Independent with assistive device(s)         Comments: pt furniture walks or uses his tobacco stick as a cane, sits to shower     Hand Dominance   Dominant Hand: Right    Extremity/Trunk Assessment   Upper Extremity Assessment Upper Extremity Assessment: Defer to OT evaluation    Lower Extremity Assessment Lower Extremity Assessment: Generalized weakness    Cervical / Trunk Assessment Cervical /  Trunk Assessment: Normal  Communication   Communication: Expressive difficulties (word retreival )  Cognition Arousal/Alertness: Awake/alert Behavior During Therapy: WFL for tasks assessed/performed Overall Cognitive Status: Impaired/Different from baseline Area of Impairment: Safety/judgement;Memory     Memory: Decreased short-term memory   Safety/Judgement: Decreased  awareness of deficits          General Comments General comments (skin integrity, edema, etc.): Pt and family report no recent falls.          Assessment/Plan    PT Assessment Patient needs continued PT services  PT Problem List Decreased strength;Decreased activity tolerance;Decreased balance;Decreased mobility;Decreased safety awareness          PT Treatment Interventions DME instruction;Gait training;Stair training;Functional mobility training;Therapeutic activities;Therapeutic exercise;Balance training;Patient/family education    PT Goals (Current goals can be found in the Care Plan section)  Acute Rehab PT Goals Patient Stated Goal: to go home PT Goal Formulation: With patient/family Time For Goal Achievement: 05/02/16 Potential to Achieve Goals: Good    Frequency Min 4X/week           End of Session Equipment Utilized During Treatment: Gait belt Activity Tolerance: Patient tolerated treatment well Patient left: in bed;with call bell/phone within reach;with family/visitor present Nurse Communication: Mobility status         Time: [id number] PT Time Calculation (min) (ACUTE ONLY): 24 min   Charges:   PT Evaluation $PT Eval Moderate Complexity: 1 Procedure PT Treatments $Gait Training: 8-22 mins        Jas Betten B. [place], [place], DPT [phone]   04/18/2016, 11:43 PM

## 2016-04-18 NOTE — Progress Notes (Signed)
  Echocardiogram 2D Echocardiogram has been performed.  Tresa Res 04/18/2016, 10:11 AM

## 2016-04-18 NOTE — Evaluation (Signed)
Occupational Therapy Evaluation Patient Details Name: Austin Rose MRN: [id number] DOB: 1925/10/06 Today's Date: 04/18/2016    History of Present Illness Pt admitted with confusion and difficulty with word finding. CT and MRI negative for acute changes. PMH: HTN, HLD, CAD with CABG, orthostatic hypotension, back surgery.   Clinical Impression   Pt is independent with AD at baseline. Presents with impaired cognition, speech and poor standing balance interfering with ability to perform at his baseline. Will follow acutely.   Follow Up Recommendations  Home health OT    Equipment Recommendations  None recommended by OT    Recommendations for Other Services       Precautions / Restrictions Precautions Precautions: Fall Restrictions Weight Bearing Restrictions: No      Mobility Bed Mobility Overal bed mobility: Needs Assistance Bed Mobility: Supine to Sit;Sit to Supine     Supine to sit: Supervision Sit to supine: Supervision   General bed mobility comments: increased time, no assist  Transfers Overall transfer level: Needs assistance   Transfers: Sit to/from Stand Sit to Stand: Min guard              Balance Overall balance assessment: Needs assistance Sitting-balance support: Feet supported Sitting balance-Leahy Scale: Good       Standing balance-Leahy Scale: Poor                              ADL Overall ADL's : Needs assistance/impaired Eating/Feeding: Independent;Bed level   Grooming: Wash/dry hands;Min guard;Standing   Upper Body Bathing: Set up;Sitting   Lower Body Bathing: Min guard;Sit to/from stand Lower Body Bathing Details (indicate cue type and reason): started to don 2 socks on one foot Upper Body Dressing : Sitting;Minimal assistance   Lower Body Dressing: Sit to/from stand;Min guard   Toilet Transfer: Minimal assistance;Ambulation (pushed IV pole with both hands)   Toileting- Clothing Manipulation and Hygiene:  Minimal assistance;Sit to/from stand       Functional mobility during ADLs: Minimal assistance (pushing IV pole with both hands)       Vision     Perception     Praxis      Pertinent Vitals/Pain Pain Assessment: No/denies pain     Hand Dominance Right   Extremity/Trunk Assessment Upper Extremity Assessment Upper Extremity Assessment: RUE deficits/detail RUE Deficits / Details: longstanding shoulder limitations RUE Coordination: decreased gross motor   Lower Extremity Assessment Lower Extremity Assessment: Defer to PT evaluation       Communication Communication Communication: Expressive difficulties   Cognition Arousal/Alertness: Awake/alert Behavior During Therapy: WFL for tasks assessed/performed Overall Cognitive Status: Impaired/Different from baseline Area of Impairment: Safety/judgement;Memory     Memory: Decreased short-term memory   Safety/Judgement: Decreased awareness of deficits     General Comments: Pt with word finding difficulties at times, sons question if pt does not have some dementia, but has not been diagnosed.   General Comments       Exercises       Shoulder Instructions      Home Living Family/patient expects to be discharged to:: Private residence Living Arrangements: Spouse/significant other Available Help at Discharge: Family;Available 24 hours/day (wife cannot provide physical assist) Type of Home: Mobile home Home Access: Ramped entrance     Home Layout: One level     Bathroom Shower/Tub: Teacher, early years/pre: Standard     Home Equipment: Environmental consultant - 2 wheels;Shower seat;Grab bars - tub/shower (uses a tobacco  stick as a walking cane)          Prior Functioning/Environment Level of Independence: Independent with assistive device(s)        Comments: pt furniture walks or uses his tobacco stick as a cane, sits to shower        OT Problem List: Impaired balance (sitting and/or standing);Decreased  coordination;Decreased cognition;Decreased safety awareness;Decreased knowledge of use of DME or AE   OT Treatment/Interventions: Self-care/ADL training;DME and/or AE instruction;Patient/family education;Balance training;Therapeutic activities;Cognitive remediation/compensation    OT Goals(Current goals can be found in the care plan section) Acute Rehab OT Goals Patient Stated Goal: to go home OT Goal Formulation: With patient Time For Goal Achievement: 04/25/16 Potential to Achieve Goals: Good ADL Goals Pt Will Perform Grooming: with supervision;standing Pt Will Perform Lower Body Bathing: with supervision;sit to/from stand Pt Will Perform Lower Body Dressing: with supervision;sit to/from stand Pt Will Transfer to Toilet: with supervision;ambulating Pt Will Perform Toileting - Clothing Manipulation and hygiene: with supervision;sit to/from stand Additional ADL Goal #1: Pt will gather items for ADL around room with RW and supervision.  OT Frequency: Min 2X/week   Barriers to D/C:            Co-evaluation              End of Session Equipment Utilized During Treatment: Gait belt  Activity Tolerance: Patient tolerated treatment well Patient left: in bed;with call bell/phone within reach;with bed alarm set;with family/visitor present   Time: [id number] OT Time Calculation (min): 31 min Charges:  OT General Charges $OT Visit: 1 Procedure OT Evaluation $OT Eval Moderate Complexity: 1 Procedure OT Treatments $Self Care/Home Management : 8-22 mins G-Codes: OT G-codes **NOT FOR INPATIENT CLASS** Functional Assessment Tool Used: clinical judgement Functional Limitation: Self care Self Care Current Status [id number]): At least 20 percent but less than 40 percent impaired, limited or restricted Self Care Goal Status [id number]): At least 1 percent but less than 20 percent impaired, limited or restricted  [name] 04/18/2016, 9:14 AM  [phone]

## 2016-04-18 NOTE — Progress Notes (Signed)
VASCULAR LAB PRELIMINARY  PRELIMINARY  PRELIMINARY  PRELIMINARY     Carotid duplex completed.    Preliminary report:  Bilateral:  1-39% ICA stenosis.  Vertebral artery flow is antegrade.     Janett Kamath, RVS 04/18/2016, 11:47 AM

## 2016-04-18 NOTE — Care Management Obs Status (Signed)
[place] NOTIFICATION   Patient Details  Name: Austin Rose MRN: [id number] Date of Birth: 07-03-1925   Medicare Observation Status Notification Given:  Yes    Pollie Friar, RN 04/18/2016, 3:11 PM

## 2016-04-18 NOTE — Progress Notes (Signed)
Patient arrived to floor via ED staff, son and belongings at bedside. Oriented to room/floor. Vitals stable, tele applied and verified.  Questions answered. Continue to monitor patient.

## 2016-04-19 DIAGNOSIS — R4701 Aphasia: Secondary | ICD-10-CM

## 2016-04-19 LAB — VAS US CAROTID
LCCADDIAS: -11 cm/s
LCCAPSYS: 80 cm/s
LEFT ECA DIAS: -11 cm/s
LEFT VERTEBRAL DIAS: 11 cm/s
LICADDIAS: -8 cm/s
LICADSYS: -36 cm/s
LICAPSYS: -49 cm/s
Left CCA dist sys: -65 cm/s
Left CCA prox dias: 19 cm/s
Left ICA prox dias: -18 cm/s
RCCADSYS: -66 cm/s
RIGHT ECA DIAS: 11 cm/s
RIGHT VERTEBRAL DIAS: -3 cm/s
Right CCA prox dias: 5 cm/s
Right CCA prox sys: 40 cm/s

## 2016-04-19 LAB — COMPREHENSIVE METABOLIC PANEL
ALT: 146 U/L — ABNORMAL HIGH (ref 17–63)
AST: 128 U/L — AB (ref 15–41)
Albumin: 3.2 g/dL — ABNORMAL LOW (ref 3.5–5.0)
Alkaline Phosphatase: 186 U/L — ABNORMAL HIGH (ref 38–126)
Anion gap: 7 (ref 5–15)
BUN: 14 mg/dL (ref 6–20)
CHLORIDE: 108 mmol/L (ref 101–111)
CO2: 23 mmol/L (ref 22–32)
Calcium: 9.1 mg/dL (ref 8.9–10.3)
Creatinine, Ser: 1.04 mg/dL (ref 0.61–1.24)
Glucose, Bld: 87 mg/dL (ref 65–99)
POTASSIUM: 3.9 mmol/L (ref 3.5–5.1)
Sodium: 138 mmol/L (ref 135–145)
Total Bilirubin: 1.2 mg/dL (ref 0.3–1.2)
Total Protein: 5.7 g/dL — ABNORMAL LOW (ref 6.5–8.1)

## 2016-04-19 LAB — HEMOGLOBIN A1C
HEMOGLOBIN A1C: 5.1 % (ref 4.8–5.6)
MEAN PLASMA GLUCOSE: 100 mg/dL

## 2016-04-19 MED ORDER — HYDROCODONE-ACETAMINOPHEN 10-325 MG PO TABS: ORAL_TABLET | ORAL | 0 refills | Status: DC

## 2016-04-19 MED ORDER — ASPIRIN 325 MG PO TABS: 325.0000 mg | ORAL_TABLET | Freq: Every day | ORAL | Status: AC

## 2016-04-19 MED ORDER — VITAMIN B-12 1000 MCG PO TABS
1000.0000 ug | ORAL_TABLET | Freq: Two times a day (BID) | ORAL | Status: DC
  Administered 2016-04-19: 1000 ug via ORAL
  Filled 2016-04-19: qty 1

## 2016-04-19 NOTE — Discharge Summary (Signed)
Physician Discharge Summary  Austin Rose [id number] DOB: 12/07/1925 DOA: 04/17/2016  PCP: Lottie Dawson, MD  Admit date: 04/17/2016 Discharge date: 04/19/2016  Recommendations for Outpatient Follow-up:  1. Pt will need to follow up with PCP in 2-3 weeks post discharge 2. Please obtain BMP to evaluate electrolytes and kidney function 3. Please also check CBC to evaluate Hg and Hct levels 4. Check LFT's to follow up on liver function tests   Discharge Diagnoses:  Principal Problem:   Expressive aphasia Active Problems:   Chronic orthostatic hypotension   TIA (transient ischemic attack)   CAD (coronary artery disease) of artery bypass graft  Discharge Condition: Stable  Diet recommendation: Heart healthy diet discussed in details   Brief Narrative:  83 y.o.malewith HTN, HLD, CAD s/p CABG, orthostatic hypotension; who presented with difficulty getting his words out. Patient was last seen normal prior to noon on the day of the admission and had otherwise been in his normal state of health. Thereafter, family noted that he was somewhat confused and had difficulty forming his words.  Assessment & Plan:  Expressive aphasia 2/2 suspected TIA (Transient ischemic attack) - CT imaging of the brain negative for any acute abnormalities. - MRI: MRI HEAD: No acute intracranial process: Old RIGHT basal ganglia and thalamus lacunar infarcts. - echocardiogram : EF 50-55% - pt wants to go home, spoke with daughter over the phone  Gait disturbance - HH PT requested   CAD - has not had any chest pain   Anemia of chronic illness, thrombocytopenia  - no signs of bleeding while inpatient   History of orthostatic hypotension - Continue Midodrine  Elevated LFTS - unclear etiology - [place] notable for cholelithiasis and some sludge, pt with no pain on exam and prefers to follow up with PCP   Code Status: Full Code  Procedures/Studies: Ct Head Wo Contrast  Result Date:  04/17/2016 CLINICAL DATA:  Slurred speech and confusion. Stroke-like symptoms greater than 8 hours. EXAM: CT HEAD WITHOUT CONTRAST TECHNIQUE: Contiguous axial images were obtained from the base of the skull through the vertex without intravenous contrast. COMPARISON:  CT from 07/17/2010 FINDINGS: Brain: Motion related artifacts necessitating repeat imaging through portions of the brain. There sulcal and ventricular prominence consistent with moderate superficial and central atrophy. Chronic small vessel ischemic disease of periventricular white matter. No acute large vascular territory infarction, hemorrhage or midline shift. No extra-axial fluid collections. No effacement of the basal cisterns. Fourth ventricle is midline. Cerebellar atrophy noted as well. Vascular: Moderate carotid siphon calcifications. Moderate left vertebral arterial calcification. Skull: No acute osseous abnormality. Focal deviation of the mid nasal septum. Sinuses/Orbits: Nonacute Other: None IMPRESSION: Cerebral atrophy with moderate small vessel ischemic disease of periventricular white matter. No acute intracranial abnormality. Electronically Signed   By: Ashley Royalty M.D.   On: 04/17/2016 22:00   Mr Brain Wo Contrast  Result Date: 04/18/2016 : Altered mental status, word-finding difficulties beginning at noon, returned to baseline 6-1/2 hours later. History of hypertension, hyperlipidemia. EXAM: MRI HEAD WITHOUT CONTRAST MRA HEAD WITHOUT CONTRAST TECHNIQUE: Multiplanar, multiecho pulse sequences of the brain and surrounding structures were obtained without intravenous contrast. Angiographic images of the head were obtained using MRA technique without contrast. COMPARISON:  CT HEAD April 17, 2016 FINDINGS: MRI HEAD FINDINGS- Mild motion degraded examination. BRAIN: No reduced diffusion to suggest acute ischemia. No susceptibility artifact to suggest hemorrhage. The ventricles and sulci are normal for patient's age. No suspicious  parenchymal signal, masses or mass effect. Patchy supratentorial  white matter FLAIR T2 hyperintensities compatible with mild to moderate chronic small vessel ischemic disease, less than expected for age. Old RIGHT thalamus and RIGHT basal ganglia lacunar infarcts. No abnormal extra-axial fluid collections. No extra-axial masses though, contrast enhanced sequences would be more sensitive. VASCULAR: Normal major intracranial vascular flow voids present at skull base. SKULL AND UPPER CERVICAL SPINE: No abnormal sellar expansion. No suspicious calvarial bone marrow signal. Craniocervical junction maintained. SINUSES/ORBITS: The mastoid air-cells and included paranasal sinuses are well-aerated. Status post bilateral ocular lens implants. The included ocular globes and orbital contents are non-suspicious. OTHER: Patient is edentulous. MRA HEAD FINDINGS- moderately motion degraded examination, resulting in duplicated appearing vessels. ANTERIOR CIRCULATION: Asymmetrically decreased flow related enhancement RIGHT internal carotid artery with normal caliber most compatible with artifact. Anterior middle cerebral arteries are patent. Nondiagnostic for aneurysm or luminal regularity. POSTERIOR CIRCULATION: LEFT vertebral artery is dominant. Flow related enhancement bilateral vertebral arteries, basilar artery and main branch vessels. Patent bilateral posterior cerebral arteries. Nondiagnostic assessment for aneurysm or luminal irregularity. IMPRESSION: MRI HEAD: No acute intracranial process. Old RIGHT basal ganglia and thalamus lacunar infarcts. Mild to moderate chronic small vessel ischemic disease. MRA HEAD: Moderately motion degraded examination without acute large vessel occlusion or severe stenosis. Electronically Signed   By: Elon Alas M.D.   On: 04/18/2016 03:06   Mr [name] Head/brain [id number] Cm  Result Date: 04/18/2016 : Altered mental status, word-finding difficulties beginning at noon, returned to baseline 6-1/2  hours later. History of hypertension, hyperlipidemia. EXAM: MRI HEAD WITHOUT CONTRAST MRA HEAD WITHOUT CONTRAST TECHNIQUE: Multiplanar, multiecho pulse sequences of the brain and surrounding structures were obtained without intravenous contrast. Angiographic images of the head were obtained using MRA technique without contrast. COMPARISON:  CT HEAD April 17, 2016 FINDINGS: MRI HEAD FINDINGS- Mild motion degraded examination. BRAIN: No reduced diffusion to suggest acute ischemia. No susceptibility artifact to suggest hemorrhage. The ventricles and sulci are normal for patient's age. No suspicious parenchymal signal, masses or mass effect. Patchy supratentorial white matter FLAIR T2 hyperintensities compatible with mild to moderate chronic small vessel ischemic disease, less than expected for age. Old RIGHT thalamus and RIGHT basal ganglia lacunar infarcts. No abnormal extra-axial fluid collections. No extra-axial masses though, contrast enhanced sequences would be more sensitive. VASCULAR: Normal major intracranial vascular flow voids present at skull base. SKULL AND UPPER CERVICAL SPINE: No abnormal sellar expansion. No suspicious calvarial bone marrow signal. Craniocervical junction maintained. SINUSES/ORBITS: The mastoid air-cells and included paranasal sinuses are well-aerated. Status post bilateral ocular lens implants. The included ocular globes and orbital contents are non-suspicious. OTHER: Patient is edentulous. MRA HEAD FINDINGS- moderately motion degraded examination, resulting in duplicated appearing vessels. ANTERIOR CIRCULATION: Asymmetrically decreased flow related enhancement RIGHT internal carotid artery with normal caliber most compatible with artifact. Anterior middle cerebral arteries are patent. Nondiagnostic for aneurysm or luminal regularity. POSTERIOR CIRCULATION: LEFT vertebral artery is dominant. Flow related enhancement bilateral vertebral arteries, basilar artery and main branch vessels.  Patent bilateral posterior cerebral arteries. Nondiagnostic assessment for aneurysm or luminal irregularity. IMPRESSION: MRI HEAD: No acute intracranial process. Old RIGHT basal ganglia and thalamus lacunar infarcts. Mild to moderate chronic small vessel ischemic disease. MRA HEAD: Moderately motion degraded examination without acute large vessel occlusion or severe stenosis. Electronically Signed   By: Elon Alas M.D.   On: 04/18/2016 03:06   US Abdomen Limited Ruq  Result Date: 04/18/2016 CLINICAL DATA:  Unspecified disorder of liver function. Elevated LFT. EXAM: US ABDOMEN LIMITED - RIGHT UPPER QUADRANT COMPARISON:  CT from 07/17/2010 FINDINGS: Gallbladder: The gallbladder is contracted in appearance and contains biliary sludge and calculi the largest approximately 7 mm. No pericholecystic fluid. No sonographic Murphy's. Common bile duct: Diameter: 8 mm in caliber without choledocholithiasis identified. Liver: No focal lesion identified. Within normal limits in parenchymal echogenicity. IMPRESSION: Cholelithiasis and biliary sludge within a contracted gallbladder. Ectatic common bile duct which is normal for age at 8 mm. No choledocholithiasis is identified. Electronically Signed   By: Ashley Royalty M.D.   On: 04/18/2016 21:44     Discharge Exam: Vitals:   04/19/16 0459 04/19/16 1010  BP: (!) 168/85 134/78  Pulse: 64 60  Resp: 18 16  Temp: 97.7 F (36.5 C) 97.7 F (36.5 C)   Vitals:   04/18/16 2227 04/19/16 0210 04/19/16 0459 04/19/16 1010  BP: (!) 146/96 (!) 137/57 (!) 168/85 134/78  Pulse: 66 63 64 60  Resp: 18 18 18 16   Temp: 97.8 F (36.6 C) 97.8 F (36.6 C) 97.7 F (36.5 C) 97.7 F (36.5 C)  TempSrc: Oral Oral Oral Oral  SpO2: 99% 100% 99%   Weight:      Height:        General: Pt is alert, follows commands appropriately, not in acute distress Cardiovascular: Regular rate and rhythm, S1/S2 +, no rubs, no gallops Respiratory: Clear to auscultation bilaterally, no  wheezing, no crackles, no rhonchi Abdominal: Soft, non tender, non distended, bowel sounds +, no guarding Extremities: no edema, no cyanosis, pulses palpable bilaterally DP and PT Neuro: Grossly nonfocal  Discharge Instructions  Discharge Instructions    Diet - low sodium heart healthy    Complete by:  As directed    Increase activity slowly    Complete by:  As directed      Allergies as of 04/19/2016      Reactions   Gabapentin Other (See Comments)   Behavior changes noted by family  Better when stopped  2015   Penicillins Other (See Comments)   unknown      Medication List    TAKE these medications   aspirin 325 MG tablet Take 1 tablet (325 mg total) by mouth daily. Start taking on:  04/20/2016   B-12 PO Take 1 tablet by mouth 2 (two) times daily.   HYDROcodone-acetaminophen 10-325 MG tablet Commonly known as:  NORCO Take 1 tablet by mouth as needed for pain.  Maximum of every 6 hours.   midodrine 5 MG tablet Commonly known as:  PROAMATINE Take 5 mg by mouth 2 (two) times daily with a meal. 1 tab  8AM, 1 PM and 1 at 6 PM- Pt gets thru the [place], [facility] Follow up.   Specialties:  Internal Medicine, Pediatrics Contact information: [place] [address] [phone]            The results of significant diagnostics from this hospitalization (including imaging, microbiology, ancillary and laboratory) are listed below for reference.     Microbiology: No results found for this or any previous visit (from the past 240 hour(s)).   Labs: Basic Metabolic Panel:  Recent Labs Lab 04/17/16 2137 04/17/16 2145 04/19/16 0550  NA 138 140 138  K 4.1 4.0 3.9  CL 108 106 108  CO2 21*  --  23  GLUCOSE 119* 122* 87  BUN 17 19 14   CREATININE 1.11 1.10 1.04  CALCIUM 9.1  --  9.1   Liver Function Tests:  Recent Labs Lab 04/17/16 2137 04/19/16 0550  AST 589* 128*  ALT 295* 146*  ALKPHOS 214*  186*  BILITOT 2.2* 1.2  PROT 6.1* 5.7*  ALBUMIN 3.4* 3.2*   CBC:  Recent Labs Lab 04/17/16 2137 04/17/16 2145 04/18/16 0721  WBC 4.8  --  4.9  NEUTROABS 3.6  --   --   HGB 12.1* 12.2* 11.8*  HCT 36.5* 36.0* 35.5*  MCV 89.9  --  90.3  PLT 139*  --  121*   SIGNED: Time coordinating discharge: 30 minutes  MAGICK-Jhordyn Hoopingarner, MD  [facility] 04/19/2016, 11:10 AM Pager [phone]  If 7PM-7AM, please contact night-coverage [email or web address] Password [email or web address]

## 2016-04-19 NOTE — Care Management Note (Signed)
Case Management Note  Patient Details  Name: Austin Rose MRN: [id number] Date of Birth: 1926-02-07  Subjective/Objective:                    Action/Plan: Pt discharging home with [facility] services. CM met with the patient and his daughter and provided them a list of [facility] agencies. She selected [facility]. CM spoke to [facility] with [facility] and they accepted the referral. Pt's daughter to transport him home.   Expected Discharge Date:                  Expected Discharge Plan:  [place]  In-House Referral:     Discharge planning Services  CM Consult  Post Acute Care Choice:  Home Health Choice offered to:  Adult Children, Patient  DME Arranged:    DME Agency:     HH Arranged:  PT, OT, Speech Therapy HH Agency:  [place]  Status of Service:  Completed, signed off  If discussed at [place] of Stay Meetings, dates discussed:    Additional Comments:  Pollie Friar, RN 04/19/2016, 12:06 PM

## 2016-04-19 NOTE — Progress Notes (Signed)
Occupational Therapy Treatment Patient Details Name: Austin Rose MRN: [id number] DOB: March 28, 1926 Today's Date: 04/19/2016    History of present illness Pt admitted with confusion and difficulty with word finding. CT and MRI negative for acute changes. PMH: HTN, HLD, CAD with CABG, orthostatic hypotension, back surgery.  Pt dx with TIA   OT comments  Pt demonstrates decr safety with Rw during session and requires seated position for all bathing task this session. Pt unable to sustain static standing. Pt required wall support for peri care in standing during session. Recommend HHOT for d/c planning. Pt pleasant and eager to sit up in chair with therapy this session.    Follow Up Recommendations  Home health OT    Equipment Recommendations  None recommended by OT    Recommendations for Other Services      Precautions / Restrictions Precautions Precautions: Fall Precaution Comments: Pt is reliant on both hands for support during gait.         Mobility Bed Mobility Overal bed mobility: Needs Assistance Bed Mobility: Supine to Sit     Supine to sit: Supervision        Transfers Overall transfer level: Needs assistance Equipment used: 1 person hand held assist Transfers: Sit to/from Stand Sit to Stand: Min assist         General transfer comment: requires cues for safety with RW    Balance Overall balance assessment: Needs assistance Sitting-balance support: Bilateral upper extremity supported;Feet supported Sitting balance-Leahy Scale: Good     Standing balance support: Single extremity supported;During functional activity Standing balance-Leahy Scale: Poor                     ADL Overall ADL's : Needs assistance/impaired     Grooming: Wash/dry hands;Wash/dry face;Supervision/safety;Sitting   Upper Body Bathing: Supervision/ safety;Sitting Upper Body Bathing Details (indicate cue type and reason): requires seated position adn reports sitting on a  "stool" at home Lower Body Bathing: Supervison/ safety   Upper Body Dressing : Supervision/safety   Lower Body Dressing: Supervision/safety   Toilet Transfer: Minimal assistance;Regular Toilet;Grab bars Toilet Transfer Details (indicate cue type and reason): requires (A) to power off commode. reports commode is higher at home Toileting- Water quality scientist and Hygiene: Supervision/safety Toileting - Clothing Manipulation Details (indicate cue type and reason): pt using wash cloth to ensure hygiene after toileting     Functional mobility during ADLs: Minimal assistance General ADL Comments: pt provided RW but abandons at times during session      Vision                     Perception     Praxis      Cognition   Behavior During Therapy: [facility] for tasks assessed/performed Overall Cognitive Status: Impaired/Different from baseline Area of Impairment: Safety/judgement          Safety/Judgement: Decreased awareness of deficits          Extremity/Trunk Assessment               Exercises     Shoulder Instructions       General Comments      Pertinent Vitals/ Pain       Pain Assessment: No/denies pain  Home Living  Prior Functioning/Environment              Frequency  Min 2X/week        Progress Toward Goals  OT Goals(current goals can now be found in the care plan section)  Progress towards OT goals: Progressing toward goals  Acute Rehab OT Goals Patient Stated Goal: to go home OT Goal Formulation: With patient Time For Goal Achievement: 04/25/16 Potential to Achieve Goals: Good ADL Goals Pt Will Perform Grooming: with supervision;standing Pt Will Perform Lower Body Bathing: with supervision;sit to/from stand Pt Will Perform Lower Body Dressing: with supervision;sit to/from stand Pt Will Transfer to Toilet: with supervision;ambulating Pt Will Perform Toileting - Clothing  Manipulation and hygiene: with supervision;sit to/from stand Additional ADL Goal #1: Pt will gather items for ADL around room with RW and supervision.  Plan Discharge plan remains appropriate    Co-evaluation                 End of Session Equipment Utilized During Treatment: Gait belt;Rolling walker   Activity Tolerance Patient tolerated treatment well   Patient Left in chair;with chair alarm set   Nurse Communication Mobility status;Precautions        Time: [id number] OT Time Calculation (min): 20 min  Charges: OT General Charges $OT Visit: 1 Procedure OT Treatments $Self Care/Home Management : 8-22 mins  Parke Poisson B 04/19/2016, 9:41 AM   Jeri Modena   OTR/L Pager: [phone] Office: [phone] .

## 2016-04-19 NOTE — Discharge Instructions (Signed)
Aphasia Introduction Aphasia is damage to the part of your brain that you need to communicate. For most people, that area is on the left side of the brain. Aphasia does not affect your intelligence, but you may struggle to talk, understand speech, read, or write. Aphasia can happen to anyone at any age, but it is most common in older age. What are the causes? An interruption of blood supply to the brain (stroke) is the most common cause of aphasia. Any disease or disorder that damages the communication areas of the brain can cause aphasia. This includes:  Brain tumors.  Brain injuries.  Brain infections.  Progressive diseases of the nervous system (neurological disorders). What increases the risk? You may be at risk for aphasia if you have had any trauma, disease, or disorder that damaged the communication areas of the brain. What are the signs or symptoms? Aphasia may start suddenly if it is caused by a stroke or brain injury. Aphasia caused by a tumor or a progressive neurological disorder may start gradually. The condition affects people differently. Signs and symptoms of aphasia include:  Trouble finding the right word.  Using the wrong words.  Talking in sentences that do not make sense.  Making up words.  Being unable to understand other people's speech.  Having problems writing, spelling, or reading.  Having trouble with numbers.  Having trouble swallowing. How is this diagnosed? Your health care provider may suspect you have aphasia if you lose the ability to speak or understand language. You may need to see a specialist (speech and language pathologist) to help determine the diagnosis of aphasia. This person may do a series of tests to check your ability to:  Speak.  Express ideas.  Make conversation.  Understand speech.  Read and write. How is this treated? In some cases, aphasia may improve on its own over time. Treatment for aphasia usually involves therapy  with a pathologist. Your treatment will be designed to meet your needs and abilities. Common treatments include:  Speech therapy.  Learning other ways to communicate.  Working with family members to find the best ways to communicate.  Working with an occupational therapist to find ways to communicate at work. Follow these instructions at home:  Keep all follow-up appointments.  Make sure you have a good support system at home.  The following techniques may be helpful while communicating:  Use short, simple sentences. Ask family members to do the same. Sentences that require one-word answers are easiest.  Avoid distractions like background noise when trying to listen or talk.  Try communicating with gestures, pointing, or drawing.  Talk slowly. Ask family members to talk to you slowly.  Maintain eye contact when communicating. Contact a health care provider if:  Your symptoms change or get worse.  You need more support at home.  You are struggling with anxiety or depression.  You develop trouble swallowing. This information is not intended to replace advice given to you by your health care provider. Make sure you discuss any questions you have with your health care provider. Document Released: 12/22/2001 Document Revised: 10/20/2015 Document Reviewed: 06/21/2013  2017 Elsevier

## 2016-04-19 NOTE — Progress Notes (Signed)
Discharge instruction is reviewed with patient and daughter. Encouraged to go to follow up appointment and take meds as prescribed.
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
## 2016-04-19 NOTE — Progress Notes (Signed)
Physical Therapy Treatment Patient Details Name: Austin Rose MRN: [id number] DOB: Sep 10, 1925 Today's Date: 04/19/2016    History of Present Illness Pt admitted with confusion and difficulty with word finding. CT and MRI negative for acute changes. PMH: HTN, HLD, CAD with CABG, orthostatic hypotension, back surgery.  Pt dx with TIA    PT Comments    Pt progressing well.  Improved stability observed with RW.  Will continue to follow during acute hospitalization.  fatigues limits function at this time.  Will return home with wife.    Follow Up Recommendations  No PT follow up     Equipment Recommendations  None recommended by PT    Recommendations for Other Services       Precautions / Restrictions Precautions Precautions: Fall Precaution Comments: Pt is reliant on both hands for support during gait.   Restrictions Weight Bearing Restrictions: No    Mobility  Bed Mobility Overal bed mobility: Needs Assistance Bed Mobility: Supine to Sit     Supine to sit: Supervision     General bed mobility comments: Pt received in recliner on arrival.    Transfers Overall transfer level: Needs assistance Equipment used: Rolling walker (2 wheeled) Transfers: Sit to/from Stand Sit to Stand: Min guard         General transfer comment: Cues for hand placement and RW safety  Ambulation/Gait Ambulation/Gait assistance: Supervision Ambulation Distance (Feet): 250 Feet Assistive device: Rolling walker (2 wheeled) Gait Pattern/deviations: Step-through pattern;Decreased stride length;Trunk flexed;Drifts right/left Gait velocity: decreased Gait velocity interpretation: Below normal speed for age/gender General Gait Details: Remains to drift B with RW but improved confidence and balance with use of RW.  Reports he has RW at home for home use.     Stairs Stairs:  (Pt has a ramp. )          Wheelchair Mobility    Modified Rankin (Stroke Patients Only)       Balance  Overall balance assessment: Needs assistance Sitting-balance support: Bilateral upper extremity supported;Feet supported Sitting balance-Leahy Scale: Good     Standing balance support: Single extremity supported;During functional activity Standing balance-Leahy Scale: Poor                      Cognition Arousal/Alertness: Awake/alert Behavior During Therapy: WFL for tasks assessed/performed Overall Cognitive Status: Impaired/Different from baseline Area of Impairment: Safety/judgement     Memory: Decreased short-term memory   Safety/Judgement: Decreased awareness of deficits          Exercises General Exercises - Lower Extremity Hip Flexion/Marching: AROM;Both;10 reps;Standing Heel Raises: AROM;Both;10 reps;Standing Mini-Sqauts: AROM;Both;10 reps;Standing    General Comments        Pertinent Vitals/Pain Pain Assessment: No/denies pain    Home Living                      Prior Function            PT Goals (current goals can now be found in the care plan section) Acute Rehab PT Goals Patient Stated Goal: to go home Potential to Achieve Goals: Good Progress towards PT goals: Progressing toward goals    Frequency    Min 4X/week      PT Plan Current plan remains appropriate    Co-evaluation             End of Session Equipment Utilized During Treatment: Gait belt Activity Tolerance: Patient tolerated treatment well Patient left: with call bell/phone within reach;in chair;with  chair alarm set;with family/visitor present     Time: [id number] PT Time Calculation (min) (ACUTE ONLY): 20 min  Charges:  $Gait Training: 8-22 mins                    G Codes:      Cristela Blue Apr 29, 2016, 11:39 AM Governor Rooks, PTA pager [phone]

## 2016-04-20 LAB — HEPATITIS PANEL, ACUTE
HCV Ab: <0.1 s/co ratio (ref 0.0–0.9)
HEP B C IGM: NEGATIVE
Hep A IgM: NEGATIVE
Hepatitis B Surface Ag: NEGATIVE

## 2016-04-22 ENCOUNTER — Telehealth: Payer: Self-pay

## 2016-04-22 ENCOUNTER — Telehealth: Payer: Self-pay | Admitting: Internal Medicine

## 2016-04-22 NOTE — Telephone Encounter (Signed)
LMTCB
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
## 2016-04-22 NOTE — Telephone Encounter (Signed)
Spoke to the pt and advised too early to fill medication.  Pt looked at his prescription bottle and realized he looked at the date wrong.  He thought he had it filled on 04/01/16.  Pt will call back if a few weeks.

## 2016-04-22 NOTE — Telephone Encounter (Signed)
Pt needs refill on  ° ° °HYDROcodone-acetaminophen (NORCO) 10-325 MG tablet °

## 2016-04-23 ENCOUNTER — Telehealth: Payer: Self-pay | Admitting: Internal Medicine

## 2016-04-23 NOTE — Telephone Encounter (Signed)
Allene Pyo is trying to TCM the pt.  Will hopefully get him on the schedule for follow up.

## 2016-04-23 NOTE — Telephone Encounter (Signed)
PT with [name] called to let you know that they did the evaluation on 04/22/16 did not know that the pt went home on 04/21/16 (Sunday).  Pt refuse all of [facility] services.

## 2016-04-26 NOTE — Telephone Encounter (Signed)
D/C 1/5 To: home  Spoke with pt and he states that he is doing very well. He has no questions, complaints or concerns at this time.   Appt scheduled 04/30/16 with Dr Regis Bill. Pt aware.   Transition Care Management Follow-up Telephone Call  How have you been since you were released from the hospital? Very good   Do you understand why you were in the hospital? yes   Do you understand the discharge instrcutions? yes  Items Reviewed:  Medications reviewed: yes  Allergies reviewed: yes  Dietary changes reviewed: yes  Referrals reviewed: yes   Functional Questionnaire:   Activities of Daily Living (ADLs):   He states they are independent in the following: ambulation, bathing and hygiene, feeding, continence, grooming, toileting and dressing States they require assistance with the following: none   Any transportation issues/concerns?: no   Any patient concerns? no   Confirmed importance and date/time of follow-up visits scheduled: yes   Confirmed with patient if condition begins to worsen call PCP or go to the ER.  Patient was given the Call-a-Nurse line [phone]: yes

## 2016-04-26 NOTE — Progress Notes (Signed)
Pre visit review using our clinic review tool, if applicable. No additional management support is needed unless otherwise documented below in the visit note.  Chief Complaint  Patient presents with  . Hospitalization Follow-up    HPI: Austin Rose 83 y.o.    Fu hosp for cva    tia See details below. He apparently had an episode of poor speech not being able to get his words out without associated motor or numbness or falling. He was evaluated in the hospital see below was noted to have abnormal LFTs and some mild anemia felt of chronic disease. No acute blockage old basilar lacunar infarcts noted. He is here today for follow-up. In the hospital he states they continued his daily pain medicine and will need a refill soon. There is bad weather, eating and he may need need to come back in another week to get a refill and aspirate today. And med he is still on  Daily opiate for years. And wants to continue on this. Currently he denies significant shortness of breath no bleeding is now on aspirin remote history of gastric ulcer no vomiting eating okay.  Heavy getting care at the [facility] but hasn't seen him recently. Thinks "maybe they forgot about him".  See   Synopsis below and plan for FU  DOA: 04/17/2016  PCP: Lottie Dawson, MD  Admit date: 04/17/2016 Discharge date: 04/19/2016  Recommendations for Outpatient Follow-up:  1. Pt will need to follow up with PCP in 2-3 weeks post discharge 2. Please obtain BMP to evaluate electrolytes and kidney function 3. Please also check CBC to evaluate Hg and Hct levels 4. Check LFT's to follow up on liver function tests   Discharge Diagnoses:  Principal Problem:   Expressive aphasia Active Problems:   Chronic orthostatic hypotension   TIA (transient ischemic attack)   CAD (coronary artery disease) of artery bypass graft  Discharge Condition: Stable  Assessment & Plan:  Expressive aphasia 2/2 suspected TIA (Transient ischemic attack) -  CT imaging of the brain negative for any acute abnormalities. - MRI: MRI HEAD: No acute intracranial process: Old RIGHT basal ganglia and thalamus lacunar infarcts. - echocardiogram : EF 50-55% - pt wants to go home, spoke with daughter over the phone  Gait disturbance - HH PT requested   CAD - has not had any chest pain   Anemia of chronic illness, thrombocytopenia  - no signs of bleeding while inpatient   History of orthostatic hypotension - Continue Midodrine  Elevated LFTS - unclear etiology - [place] notable for cholelithiasis and some sludge, pt with no pain on exam and prefers to follow up with PCP  ROS: See pertinent positives and negatives per HPI. Denies vomiting chest pain shortness of breath at this time.  Past Medical History:  Diagnosis Date  . Arthritis   . CAD (coronary artery disease)   . History of stress test 09/20/2008  . Hx of echocardiogram 07/20/2010   The cavity size was normal, there is mild aortic stenosis,the aortic root was normal is size, the ascending aorta was normal in size.  [name] Hx of varicella   . Hyperlipemia   . Hypertension   . Orthostatic hypotension    on mitodrine per [place]  . Squamous cell skin cancer    pinna     Family History  Problem Relation Age of Onset  . Other Mother 42    brain aneursym  . Anuerysm Mother   . Heart attack Father  Social History   Social History  . Marital status: Married    Spouse name: [name]  . Number of children: [name]  . Years of education: [name]   Social History Main Topics  . Smoking status: Former Research scientist (life sciences)  . Smokeless tobacco: Current User    Types: Chew    Last attempt to quit: 04/15/1978  . Alcohol use No  . Drug use: No  . Sexual activity: No   Other Topics Concern  . 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   2 use tobacco no significant alcohol    No ets   Has fa                Outpatient Medications Prior to Visit  Medication Sig Dispense Refill  . aspirin 325 MG tablet Take 1 tablet (325 mg total) by mouth daily.    . Cyanocobalamin (B-12 PO) Take 1 tablet by mouth 2 (two) times daily.     [name] HYDROcodone-acetaminophen (NORCO) 10-325 MG tablet Take 1 tablet by mouth as needed for pain.  Maximum of every 6 hours. 10 tablet 0  . midodrine (PROAMATINE) 5 MG tablet Take 5 mg by mouth 2 (two) times daily with a meal. 1 tab  8AM, 1 PM and 1 at 6 PM- Pt gets thru the [place]      No facility-administered medications prior to visit.      EXAM:  BP 136/74 (BP Location: Right Arm, Patient Position: Sitting, Cuff Size: Normal)   Pulse 71   Temp 97.6 F (36.4 C) (Oral)   Ht 5\' 9"  (1.753 m)   Wt 166 lb (75.3 kg)   SpO2 98%   BMI 24.51 kg/m   Body mass index is 24.51 kg/m.  GENERAL: vitals reviewed and listed above, alert, oriented, appears well hydrated and in no acute distressHe has obvious seborrheic on his face but is conversant in normal speech for him. Walks with a cane assistant neuropathic gait. Able to get up onto the exam table and down without assistance but possibly spotting. HEENT: atraumatic, conjunctiva  clear, no obvious abnormalities on inspection of external nose and ears OP : no lesion edema or exudate tongue is midline face is generally symmetrical. NECK: no obvious masses on inspection palpation  LUNGS: clear to auscultation bilaterally, no wheezes, rales or rhonchi,  CV: HRRR, no clubbing cyanosis or  peripheral edema nl cap refill  there there is a very slight systolic ejection murmur only heard when he lays down and it does not radiate. Abdomen no obvious masses. MS: moves all extremities no acute findings. PSYCH: pleasant and cooperative, no obvious depression or anxiety cognition not tested.  ASSESSMENT AND PLAN:  Discussed the following assessment and plan:  Expressive aphasia - resolved  per patient  -  Plan: Basic metabolic panel, CBC with Differential/Platelet, Hepatic function panel  Abnormal LFTs - [place] tones no liver changes - Plan: Basic metabolic panel, CBC with Differential/Platelet, Hepatic function panel  Medication management - Plan: Basic metabolic panel, CBC with Differential/Platelet, Hepatic function panel  Hospital discharge follow-up  Thrombocytopenia ([place]) - Plan: Basic metabolic panel, CBC with Differential/Platelet, Hepatic function panel  Multiple lacunar infarcts ([place]) - older   High risk medication use - refill pain med  - Plan: Basic metabolic panel,  CBC with Differential/Platelet, Hepatic function panel  Calculus of gallbladder without cholecystitis without obstruction  Hereditary and idiopathic peripheral neuropathy Lab today and fu as appropriate.  No apparent sx from  Amistad stones -Patient advised to return or notify health care team  if symptoms worsen ,persist or new concerns arise.  Patient Instructions  Stay on the aspirin at this time. We'll let you know when laboratory tests are back. We'll make plan for follow-up. ROV in 1 month  I will review your record for any further advice. If needed     Standley Brooking. Houa Ackert M.D.   Procedures/Studies: ImagingResults   Ct Head Wo Contrast  Result Date: 04/17/2016 CLINICAL DATA:  Slurred speech and confusion. Stroke-like symptoms greater than 8 hours. EXAM: CT HEAD WITHOUT CONTRAST TECHNIQUE: Contiguous axial images were obtained from the base of the skull through the vertex without intravenous contrast. COMPARISON:  CT from 07/17/2010 FINDINGS: Brain: Motion related artifacts necessitating repeat imaging through portions of the brain. There sulcal and ventricular prominence consistent with moderate superficial and central atrophy. Chronic small vessel ischemic disease of periventricular white matter. No acute large vascular territory infarction, hemorrhage or midline shift. No extra-axial fluid collections. No  effacement of the basal cisterns. Fourth ventricle is midline. Cerebellar atrophy noted as well. Vascular: Moderate carotid siphon calcifications. Moderate left vertebral arterial calcification. Skull: No acute osseous abnormality. Focal deviation of the mid nasal septum. Sinuses/Orbits: Nonacute Other: None IMPRESSION: Cerebral atrophy with moderate small vessel ischemic disease of periventricular white matter. No acute intracranial abnormality. Electronically Signed   By: Ashley Royalty M.D.   On: 04/17/2016 22:00   Mr Brain Wo Contrast  Result Date: 04/18/2016 : Altered mental status, word-finding difficulties beginning at noon, returned to baseline 6-1/2 hours later. History of hypertension, hyperlipidemia. EXAM: MRI HEAD WITHOUT CONTRAST MRA HEAD WITHOUT CONTRAST TECHNIQUE: Multiplanar, multiecho pulse sequences of the brain and surrounding structures were obtained without intravenous contrast. Angiographic images of the head were obtained using MRA technique without contrast. COMPARISON:  CT HEAD April 17, 2016 FINDINGS: MRI HEAD FINDINGS- Mild motion degraded examination. BRAIN: No reduced diffusion to suggest acute ischemia. No susceptibility artifact to suggest hemorrhage. The ventricles and sulci are normal for patient's age. No suspicious parenchymal signal, masses or mass effect. Patchy supratentorial white matter FLAIR T2 hyperintensities compatible with mild to moderate chronic small vessel ischemic disease, less than expected for age. Old RIGHT thalamus and RIGHT basal ganglia lacunar infarcts. No abnormal extra-axial fluid collections. No extra-axial masses though, contrast enhanced sequences would be more sensitive. VASCULAR: Normal major intracranial vascular flow voids present at skull base. SKULL AND UPPER CERVICAL SPINE: No abnormal sellar expansion. No suspicious calvarial bone marrow signal. Craniocervical junction maintained. SINUSES/ORBITS: The mastoid air-cells and included paranasal  sinuses are well-aerated. Status post bilateral ocular lens implants. The included ocular globes and orbital contents are non-suspicious. OTHER: Patient is edentulous. MRA HEAD FINDINGS- moderately motion degraded examination, resulting in duplicated appearing vessels. ANTERIOR CIRCULATION: Asymmetrically decreased flow related enhancement RIGHT internal carotid artery with normal caliber most compatible with artifact. Anterior middle cerebral arteries are patent. Nondiagnostic for aneurysm or luminal regularity. POSTERIOR CIRCULATION: LEFT vertebral artery is dominant. Flow related enhancement bilateral vertebral arteries, basilar artery and main branch vessels. Patent bilateral posterior cerebral arteries. Nondiagnostic assessment for aneurysm or luminal irregularity. IMPRESSION: MRI HEAD: No acute intracranial process. Old RIGHT basal ganglia and thalamus lacunar infarcts. Mild to moderate chronic small vessel ischemic disease. MRA HEAD: Moderately motion degraded examination without acute large  vessel occlusion or severe stenosis. Electronically Signed   By: Elon Alas M.D.   On: 04/18/2016 03:06   Mr [name] Head/brain [id number] Cm  Result Date: 04/18/2016 : Altered mental status, word-finding difficulties beginning at noon, returned to baseline 6-1/2 hours later. History of hypertension, hyperlipidemia. EXAM: MRI HEAD WITHOUT CONTRAST MRA HEAD WITHOUT CONTRAST TECHNIQUE: Multiplanar, multiecho pulse sequences of the brain and surrounding structures were obtained without intravenous contrast. Angiographic images of the head were obtained using MRA technique without contrast. COMPARISON:  CT HEAD April 17, 2016 FINDINGS: MRI HEAD FINDINGS- Mild motion degraded examination. BRAIN: No reduced diffusion to suggest acute ischemia. No susceptibility artifact to suggest hemorrhage. The ventricles and sulci are normal for patient's age. No suspicious parenchymal signal, masses or mass effect. Patchy supratentorial  white matter FLAIR T2 hyperintensities compatible with mild to moderate chronic small vessel ischemic disease, less than expected for age. Old RIGHT thalamus and RIGHT basal ganglia lacunar infarcts. No abnormal extra-axial fluid collections. No extra-axial masses though, contrast enhanced sequences would be more sensitive. VASCULAR: Normal major intracranial vascular flow voids present at skull base. SKULL AND UPPER CERVICAL SPINE: No abnormal sellar expansion. No suspicious calvarial bone marrow signal. Craniocervical junction maintained. SINUSES/ORBITS: The mastoid air-cells and included paranasal sinuses are well-aerated. Status post bilateral ocular lens implants. The included ocular globes and orbital contents are non-suspicious. OTHER: Patient is edentulous. MRA HEAD FINDINGS- moderately motion degraded examination, resulting in duplicated appearing vessels. ANTERIOR CIRCULATION: Asymmetrically decreased flow related enhancement RIGHT internal carotid artery with normal caliber most compatible with artifact. Anterior middle cerebral arteries are patent. Nondiagnostic for aneurysm or luminal regularity. POSTERIOR CIRCULATION: LEFT vertebral artery is dominant. Flow related enhancement bilateral vertebral arteries, basilar artery and main branch vessels. Patent bilateral posterior cerebral arteries. Nondiagnostic assessment for aneurysm or luminal irregularity. IMPRESSION: MRI HEAD: No acute intracranial process. Old RIGHT basal ganglia and thalamus lacunar infarcts. Mild to moderate chronic small vessel ischemic disease. MRA HEAD: Moderately motion degraded examination without acute large vessel occlusion or severe stenosis. Electronically Signed   By: Elon Alas M.D.   On: 04/18/2016 03:06   US Abdomen Limited Ruq  Result Date: 04/18/2016 CLINICAL DATA:  Unspecified disorder of liver function. Elevated LFT. EXAM: US ABDOMEN LIMITED - RIGHT UPPER QUADRANT COMPARISON:  CT from 07/17/2010 FINDINGS:  Gallbladder: The gallbladder is contracted in appearance and contains biliary sludge and calculi the largest approximately 7 mm. No pericholecystic fluid. No sonographic Murphy's. Common bile duct: Diameter: 8 mm in caliber without choledocholithiasis identified. Liver: No focal lesion identified. Within normal limits in parenchymal echogenicity. IMPRESSION: Cholelithiasis and biliary sludge within a contracted gallbladder. Ectatic common bile duct which is normal for age at 8 mm. No choledocholithiasis is identified. Electronically Signed   By: Ashley Royalty M.D.   On: 04/18/2016 21:44

## 2016-04-30 ENCOUNTER — Encounter: Payer: Self-pay | Admitting: Internal Medicine

## 2016-04-30 ENCOUNTER — Ambulatory Visit (INDEPENDENT_AMBULATORY_CARE_PROVIDER_SITE_OTHER): Payer: Medicare HMO | Admitting: Internal Medicine

## 2016-04-30 VITALS — BP 136/74 | HR 71 | Temp 97.6°F | Ht 69.0 in | Wt 166.0 lb

## 2016-04-30 DIAGNOSIS — G609 Hereditary and idiopathic neuropathy, unspecified: Secondary | ICD-10-CM | POA: Diagnosis not present

## 2016-04-30 DIAGNOSIS — R945 Abnormal results of liver function studies: Secondary | ICD-10-CM

## 2016-04-30 DIAGNOSIS — R4701 Aphasia: Secondary | ICD-10-CM

## 2016-04-30 DIAGNOSIS — D696 Thrombocytopenia, unspecified: Secondary | ICD-10-CM

## 2016-04-30 DIAGNOSIS — R7989 Other specified abnormal findings of blood chemistry: Secondary | ICD-10-CM | POA: Diagnosis not present

## 2016-04-30 DIAGNOSIS — Z09 Encounter for follow-up examination after completed treatment for conditions other than malignant neoplasm: Secondary | ICD-10-CM

## 2016-04-30 DIAGNOSIS — K8020 Calculus of gallbladder without cholecystitis without obstruction: Secondary | ICD-10-CM

## 2016-04-30 DIAGNOSIS — Z8673 Personal history of transient ischemic attack (TIA), and cerebral infarction without residual deficits: Secondary | ICD-10-CM | POA: Diagnosis not present

## 2016-04-30 DIAGNOSIS — Z79899 Other long term (current) drug therapy: Secondary | ICD-10-CM | POA: Diagnosis not present

## 2016-04-30 DIAGNOSIS — I6381 Other cerebral infarction due to occlusion or stenosis of small artery: Secondary | ICD-10-CM | POA: Insufficient documentation

## 2016-04-30 LAB — CBC WITH DIFFERENTIAL/PLATELET
BASOS ABS: 0 10*3/uL (ref 0.0–0.1)
BASOS PCT: 0.4 % (ref 0.0–3.0)
EOS ABS: 0.2 10*3/uL (ref 0.0–0.7)
Eosinophils Relative: 3.6 % (ref 0.0–5.0)
HCT: 37.6 % — ABNORMAL LOW (ref 39.0–52.0)
Hemoglobin: 12.6 g/dL — ABNORMAL LOW (ref 13.0–17.0)
LYMPHS PCT: 30.3 % (ref 12.0–46.0)
Lymphs Abs: 2 10*3/uL (ref 0.7–4.0)
MCHC: 33.4 g/dL (ref 30.0–36.0)
MCV: 89.6 fl (ref 78.0–100.0)
MONO ABS: 0.4 10*3/uL (ref 0.1–1.0)
Monocytes Relative: 6.5 % (ref 3.0–12.0)
NEUTROS ABS: 3.9 10*3/uL (ref 1.4–7.7)
NEUTROS PCT: 59.2 % (ref 43.0–77.0)
PLATELETS: 240 10*3/uL (ref 150.0–400.0)
RBC: 4.2 Mil/uL — ABNORMAL LOW (ref 4.22–5.81)
RDW: 15.2 % (ref 11.5–15.5)
WBC: 6.6 10*3/uL (ref 4.0–10.5)

## 2016-04-30 LAB — BASIC METABOLIC PANEL
BUN: 16 mg/dL (ref 6–23)
CALCIUM: 9.3 mg/dL (ref 8.4–10.5)
CHLORIDE: 104 meq/L (ref 96–112)
CO2: 28 meq/L (ref 19–32)
CREATININE: 1.08 mg/dL (ref 0.40–1.50)
GFR: 68.46 mL/min (ref >60.00)
Glucose, Bld: 87 mg/dL (ref 70–99)
Potassium: 4.5 mEq/L (ref 3.5–5.1)
Sodium: 139 mEq/L (ref 135–145)

## 2016-04-30 LAB — HEPATIC FUNCTION PANEL
ALK PHOS: 114 U/L (ref 39–117)
ALT: 17 U/L (ref 0–53)
AST: 16 U/L (ref 0–37)
Albumin: 3.9 g/dL (ref 3.5–5.2)
BILIRUBIN DIRECT: 0.2 mg/dL (ref 0.0–0.3)
BILIRUBIN TOTAL: 0.5 mg/dL (ref 0.2–1.2)
Total Protein: 6.2 g/dL (ref 6.0–8.3)

## 2016-04-30 MED ORDER — HYDROCODONE-ACETAMINOPHEN 10-325 MG PO TABS: ORAL_TABLET | ORAL | 0 refills | Status: DC

## 2016-04-30 NOTE — Patient Instructions (Addendum)
Stay on the aspirin at this time. We'll let you know when laboratory tests are back. We'll make plan for follow-up. ROV in 1 month  I will review your record for any further advice. If needed

## 2016-05-20 ENCOUNTER — Telehealth: Payer: Self-pay | Admitting: Internal Medicine

## 2016-05-20 NOTE — Telephone Encounter (Signed)
Pt request refill  °HYDROcodone-acetaminophen (NORCO) 10-325 MG tablet °

## 2016-05-21 MED ORDER — HYDROCODONE-ACETAMINOPHEN 10-325 MG PO TABS
ORAL_TABLET | ORAL | 0 refills | Status: DC
Start: 2016-05-21 — End: 2016-06-13

## 2016-05-21 NOTE — Telephone Encounter (Signed)
Pt notified to pick up at the front desk.  Pt states he will pick up morning of 05/22/16.

## 2016-05-21 NOTE — Telephone Encounter (Signed)
Last refill 90  On jan 16  Ok to refill x 1

## 2016-05-21 NOTE — Telephone Encounter (Signed)
Pt calling to check the status of the Rx. Pt is aware of the 3 business day refill.

## 2016-06-03 NOTE — Progress Notes (Signed)
Pre visit review using our clinic review tool, if applicable. No additional management support is needed unless otherwise documented below in the visit note.  Chief Complaint  Patient presents with  . Follow-up    HPI: Austin Rose 83 y.o. come in for Chronic disease management   Since last month he has been doing well no falling using twice a day or such pain medicine that helps his legs keeps him from jumping sleeping better. He states he doesn't crave the pain pills just been on it while it helps. He has an appointment with the [facility] in the next 3-4 weeks for blood work will be done. He hasn't been on cholesterol medicine for years and hasn't seen a heart doctor but has had no symptoms of import. No unusual bruising or bleeding. No abdominal pain and didn't realize he had gallstones.     ROS: See pertinent positives and negatives per HPI.  Past Medical History:  Diagnosis Date  . Arthritis   . CAD (coronary artery disease)   . History of stress test 09/20/2008  . Hx of echocardiogram 07/20/2010   The cavity size was normal, there is mild aortic stenosis,the aortic root was normal is size, the ascending aorta was normal in size.  [name] Hx of varicella   . Hyperlipemia   . Hypertension   . Orthostatic hypotension    on mitodrine per [place]  . Squamous cell skin cancer    pinna     Family History  Problem Relation Age of Onset  . Other Mother 75    brain aneursym  . Anuerysm Mother   . Heart attack Father     Social History   Social History  . Marital status: Married    Spouse name: [name]  . Number of children: [name]  . Years of education: [name]   Social History Main Topics  . Smoking status: Former Research scientist (life sciences)  . Smokeless tobacco: Current User    Types: Chew    Last attempt to quit: 04/15/1978  . Alcohol use No  . Drug use: No  . Sexual activity: No   Other Topics Concern  . 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   2 use tobacco no significant alcohol   No ets   Has fa                Outpatient Medications Prior to Visit  Medication Sig Dispense Refill  . aspirin 325 MG tablet Take 1 tablet (325 mg total) by mouth daily.    . Cyanocobalamin (B-12 PO) Take 1 tablet by mouth 2 (two) times daily.     [name] HYDROcodone-acetaminophen (NORCO) 10-325 MG tablet Take 1 tablet by mouth as needed for pain.  Maximum of every 6 hours. 10 tablet 0  . HYDROcodone-acetaminophen (NORCO) 10-325 MG tablet Take 1 tablet by mouth as needed for pain.  Maximum of every 6 hours. 90 tablet 0  . midodrine (PROAMATINE) 5 MG tablet Take 5 mg by mouth 2 (two) times daily with a meal. 1 tab  8AM, 1 PM and 1 at 6 PM- Pt gets thru the [place]      No facility-administered medications prior to visit.      EXAM:  BP 122/72 (BP Location: Right Arm, Patient Position:  Sitting, Cuff Size: Normal)   Pulse (!) 53   Temp 98.4 F (36.9 C) (Oral)   Ht 5\' 9"  (1.753 m)   Wt 160 lb 1.6 oz (72.6 kg)   SpO2 98%   BMI 23.64 kg/m   Body mass index is 23.64 kg/m.  GENERAL: vitals reviewed and listed above, alert, oriented, appears well hydrated and in no acute distressSlightly hard of hearing doesn't like to use hearing aids HEENT: atraumatic, conjunctiva  clear, no obvious abnormalities on inspection of external nose and ears NECK: no obvious masses on inspection palpation  LUNGS: clear to auscultation bilaterally, no wheezes, rales or rhonchi, good air movement CV: HRRR, no clubbing cyanosis or  peripheral edema nl cap refill  MS: moves all extremities without noticeable focal  abnormality walks with a cane for balance flat-footed. No obvious bruising or bleeding. No petechiae obvious PSYCH: pleasant and cooperative, no obvious depression or anxiety Lab Results  Component Value Date   WBC 6.6 04/30/2016   HGB 12.6 (L) 04/30/2016   HCT 37.6 (L)  04/30/2016   PLT 240.0 04/30/2016   GLUCOSE 87 04/30/2016   CHOL 88 04/18/2016   TRIG 41 04/18/2016   HDL 37 (L) 04/18/2016   LDLCALC 43 04/18/2016   ALT 17 04/30/2016   AST 16 04/30/2016   NA 139 04/30/2016   K 4.5 04/30/2016   CL 104 04/30/2016   CREATININE 1.08 04/30/2016   BUN 16 04/30/2016   CO2 28 04/30/2016   TSH 2.101 04/18/2016   PSA 0.49 Test Methodology: [employer] PSA 07/18/2010   INR 1.02 04/17/2016   HGBA1C 5.1 04/18/2016   BP Readings from Last 3 Encounters:  06/04/16 122/72  04/30/16 136/74  04/19/16 134/78   Wt Readings from Last 3 Encounters:  06/04/16 160 lb 1.6 oz (72.6 kg)  04/30/16 166 lb (75.3 kg)  04/18/16 157 lb 6.5 oz (71.4 kg)     ASSESSMENT AND PLAN:  Discussed the following assessment and plan:  Hereditary and idiopathic peripheral neuropathy  Medication management  Calculus of gallbladder without cholecystitis without obstruction  Thrombocytopenia (HCC) - Resolved on follow-up CBC with residual mild anemia  Restless legs - prob from neuropathy  Advanced age  Hearing decreased, unspecified laterality - heaing aids bothersome to heim so doesnt wear them.  History of cerebral infarction - lacunar  Asked me if he thought I had a stroke and I said yes but maybe not at the time but had symptoms of such. Blood pressure control cholesterol and taking aspirin as the best to do. No history of heart arrhythmia. Also informed him of the gallstones but he has no symptoms. If he gets abdominal pain back with [place]. Denies depression or other new symptoms. Since the [place] will be doing a full set of labs will now need any today and assuming his mild anemia will improve. Discussed nutrition also. Plan ROV in about 4 months. -Patient advised to return or notify health care team  if  new concerns arise. Total visit 31mins > 50% spent counseling and coordinating care as indicated in above note and in instructions to patient .      Patient Instructions    Stay  on the aspirin.  Get copy of the labs you get at the [place] when you go for your appt.   continued caution with pain meds   Glad you are feeling ok .   PLan  ROV in  4 months  Or as needed   And can do  wellenss visit with SUSAN HAUK.     Standley Brooking. [place] M.D.

## 2016-06-04 ENCOUNTER — Ambulatory Visit (INDEPENDENT_AMBULATORY_CARE_PROVIDER_SITE_OTHER): Payer: Medicare HMO | Admitting: Internal Medicine

## 2016-06-04 ENCOUNTER — Encounter: Payer: Self-pay | Admitting: Internal Medicine

## 2016-06-04 VITALS — BP 122/72 | HR 53 | Temp 98.4°F | Ht 69.0 in | Wt 160.1 lb

## 2016-06-04 DIAGNOSIS — D696 Thrombocytopenia, unspecified: Secondary | ICD-10-CM | POA: Diagnosis not present

## 2016-06-04 DIAGNOSIS — H9190 Unspecified hearing loss, unspecified ear: Secondary | ICD-10-CM

## 2016-06-04 DIAGNOSIS — R54 Age-related physical debility: Secondary | ICD-10-CM

## 2016-06-04 DIAGNOSIS — G609 Hereditary and idiopathic neuropathy, unspecified: Secondary | ICD-10-CM

## 2016-06-04 DIAGNOSIS — Z79899 Other long term (current) drug therapy: Secondary | ICD-10-CM

## 2016-06-04 DIAGNOSIS — G2581 Restless legs syndrome: Secondary | ICD-10-CM | POA: Insufficient documentation

## 2016-06-04 DIAGNOSIS — K8020 Calculus of gallbladder without cholecystitis without obstruction: Secondary | ICD-10-CM | POA: Diagnosis not present

## 2016-06-04 DIAGNOSIS — Z8673 Personal history of transient ischemic attack (TIA), and cerebral infarction without residual deficits: Secondary | ICD-10-CM | POA: Insufficient documentation

## 2016-06-04 NOTE — Patient Instructions (Addendum)
Stay  on the aspirin.  Get copy of the labs you get at the [place] when you go for your appt.   continued caution with pain meds   Glad you are feeling ok .   PLan  ROV in  4 months  Or as needed   And can do  wellenss visit with SUSAN HAUK.

## 2016-06-11 ENCOUNTER — Telehealth: Payer: Self-pay | Admitting: Internal Medicine

## 2016-06-11 NOTE — Telephone Encounter (Signed)
° ° °  Pt request refill of the following: ° ° °HYDROcodone-acetaminophen (NORCO) 10-325 MG tablet ° ° °Phamacy: °

## 2016-06-12 NOTE — Telephone Encounter (Signed)
Ok to refill x 1  

## 2016-06-13 ENCOUNTER — Telehealth: Payer: Self-pay | Admitting: Emergency Medicine

## 2016-06-13 MED ORDER — HYDROCODONE-ACETAMINOPHEN 10-325 MG PO TABS: ORAL_TABLET | ORAL | 0 refills | Status: DC

## 2016-06-13 MED ORDER — HYDROCODONE-ACETAMINOPHEN 10-325 MG PO TABS
ORAL_TABLET | ORAL | 0 refills | Status: DC
Start: 2016-06-13 — End: 2016-08-13

## 2016-06-13 NOTE — Telephone Encounter (Signed)
Spoke with pt and prescription  Will be waiting up front

## 2016-06-13 NOTE — Telephone Encounter (Signed)
Pt following up on refill request.  Looks like it was printed for 10 tabs only.  Pt states supposed to be 90 tabs.   Please advise, thanks!

## 2016-07-03 ENCOUNTER — Telehealth: Payer: Self-pay | Admitting: Internal Medicine

## 2016-07-03 NOTE — Telephone Encounter (Signed)
Pt needs new rx hydrocodone °

## 2016-07-05 MED ORDER — HYDROCODONE-ACETAMINOPHEN 10-325 MG PO TABS
ORAL_TABLET | ORAL | 0 refills | Status: DC
Start: 2016-07-05 — End: 2016-08-09

## 2016-07-05 NOTE — Telephone Encounter (Signed)
Try to wean down to 3.5 or 3 per day  But ok to refill today

## 2016-07-05 NOTE — Telephone Encounter (Signed)
Pt following up on rx request. Pt states he only has a 22 day supply. Needs asap

## 2016-07-05 NOTE — Telephone Encounter (Signed)
Spoke with pt and gave Dr. Regis Bill recommendation, pt understood. Also told pt that script was ready for pick up

## 2016-07-05 NOTE — Telephone Encounter (Signed)
Pt calling to check the status of his Rx and state he is out of his medication and would like to have it today if possible.

## 2016-07-16 ENCOUNTER — Telehealth: Payer: Self-pay

## 2016-07-16 ENCOUNTER — Emergency Department (HOSPITAL_COMMUNITY): Payer: Medicare HMO

## 2016-07-16 ENCOUNTER — Encounter (HOSPITAL_COMMUNITY): Payer: Self-pay

## 2016-07-16 ENCOUNTER — Emergency Department (HOSPITAL_COMMUNITY)
Admission: EM | Admit: 2016-07-16 | Discharge: 2016-07-16 | Discharge status: Against Medical Advice | Payer: Medicare HMO | Attending: Emergency Medicine | Admitting: Emergency Medicine

## 2016-07-16 DIAGNOSIS — R410 Disorientation, unspecified: Secondary | ICD-10-CM

## 2016-07-16 DIAGNOSIS — I25810 Atherosclerosis of coronary artery bypass graft(s) without angina pectoris: Secondary | ICD-10-CM | POA: Diagnosis present

## 2016-07-16 DIAGNOSIS — I9589 Other hypotension: Secondary | ICD-10-CM | POA: Diagnosis not present

## 2016-07-16 DIAGNOSIS — I959 Hypotension, unspecified: Secondary | ICD-10-CM | POA: Insufficient documentation

## 2016-07-16 DIAGNOSIS — Z85828 Personal history of other malignant neoplasm of skin: Secondary | ICD-10-CM | POA: Diagnosis not present

## 2016-07-16 DIAGNOSIS — Z87891 Personal history of nicotine dependence: Secondary | ICD-10-CM | POA: Diagnosis not present

## 2016-07-16 DIAGNOSIS — R531 Weakness: Secondary | ICD-10-CM | POA: Diagnosis not present

## 2016-07-16 DIAGNOSIS — E538 Deficiency of other specified B group vitamins: Secondary | ICD-10-CM

## 2016-07-16 DIAGNOSIS — E039 Hypothyroidism, unspecified: Secondary | ICD-10-CM | POA: Diagnosis present

## 2016-07-16 DIAGNOSIS — E784 Other hyperlipidemia: Secondary | ICD-10-CM

## 2016-07-16 DIAGNOSIS — E785 Hyperlipidemia, unspecified: Secondary | ICD-10-CM | POA: Diagnosis present

## 2016-07-16 DIAGNOSIS — I2510 Atherosclerotic heart disease of native coronary artery without angina pectoris: Secondary | ICD-10-CM | POA: Insufficient documentation

## 2016-07-16 DIAGNOSIS — R4182 Altered mental status, unspecified: Secondary | ICD-10-CM | POA: Diagnosis not present

## 2016-07-16 DIAGNOSIS — I10 Essential (primary) hypertension: Secondary | ICD-10-CM | POA: Diagnosis not present

## 2016-07-16 DIAGNOSIS — R402411 Glasgow coma scale score 13-15, in the field [EMT or ambulance]: Secondary | ICD-10-CM | POA: Diagnosis not present

## 2016-07-16 DIAGNOSIS — R404 Transient alteration of awareness: Secondary | ICD-10-CM | POA: Diagnosis not present

## 2016-07-16 HISTORY — DX: Disorientation, unspecified: R41.0

## 2016-07-16 LAB — COMPREHENSIVE METABOLIC PANEL
ALBUMIN: 3.9 g/dL (ref 3.5–5.0)
ALT: 11 U/L — ABNORMAL LOW (ref 17–63)
AST: 21 U/L (ref 15–41)
Alkaline Phosphatase: 70 U/L (ref 38–126)
Anion gap: 9 (ref 5–15)
BILIRUBIN TOTAL: 1.2 mg/dL (ref 0.3–1.2)
BUN: 10 mg/dL (ref 6–20)
CHLORIDE: 104 mmol/L (ref 101–111)
CO2: 23 mmol/L (ref 22–32)
Calcium: 9.5 mg/dL (ref 8.9–10.3)
Creatinine, Ser: 1.09 mg/dL (ref 0.61–1.24)
GFR calc Af Amer: >60 mL/min (ref >60)
GFR calc non Af Amer: 59 mL/min — ABNORMAL LOW (ref >60)
GLUCOSE: 93 mg/dL (ref 65–99)
POTASSIUM: 4 mmol/L (ref 3.5–5.1)
Sodium: 136 mmol/L (ref 135–145)
TOTAL PROTEIN: 6.5 g/dL (ref 6.5–8.1)

## 2016-07-16 LAB — CBC WITH DIFFERENTIAL/PLATELET
Basophils Absolute: 0 10*3/uL (ref 0.0–0.1)
Basophils Relative: 1 %
Eosinophils Absolute: 0.1 10*3/uL (ref 0.0–0.7)
Eosinophils Relative: 3 %
HEMATOCRIT: 40.1 % (ref 39.0–52.0)
Hemoglobin: 13.2 g/dL (ref 13.0–17.0)
LYMPHS ABS: 1.6 10*3/uL (ref 0.7–4.0)
LYMPHS PCT: 33 %
MCH: 29.8 pg (ref 26.0–34.0)
MCHC: 32.9 g/dL (ref 30.0–36.0)
MCV: 90.5 fL (ref 78.0–100.0)
MONO ABS: 0.3 10*3/uL (ref 0.1–1.0)
MONOS PCT: 6 %
Neutro Abs: 2.8 10*3/uL (ref 1.7–7.7)
Neutrophils Relative %: 57 %
Platelets: 179 10*3/uL (ref 150–400)
RBC: 4.43 MIL/uL (ref 4.22–5.81)
RDW: 14.3 % (ref 11.5–15.5)
WBC: 4.8 10*3/uL (ref 4.0–10.5)

## 2016-07-16 LAB — RAPID URINE DRUG SCREEN, HOSP PERFORMED
Amphetamines: NOT DETECTED
BARBITURATES: NOT DETECTED
BENZODIAZEPINES: NOT DETECTED
COCAINE: NOT DETECTED
OPIATES: POSITIVE — AB
TETRAHYDROCANNABINOL: NOT DETECTED

## 2016-07-16 LAB — VITAMIN B12: Vitamin B-12: 2008 pg/mL — ABNORMAL HIGH (ref 180–914)

## 2016-07-16 LAB — URINALYSIS, ROUTINE W REFLEX MICROSCOPIC
Bilirubin Urine: NEGATIVE
Glucose, UA: NEGATIVE mg/dL
Hgb urine dipstick: NEGATIVE
Ketones, ur: NEGATIVE mg/dL
LEUKOCYTES UA: NEGATIVE
Nitrite: NEGATIVE
PH: 7 (ref 5.0–8.0)
Protein, ur: NEGATIVE mg/dL
SPECIFIC GRAVITY, URINE: 1.01 (ref 1.005–1.030)

## 2016-07-16 LAB — TSH: TSH: 4.659 u[IU]/mL — ABNORMAL HIGH (ref 0.350–4.500)

## 2016-07-16 LAB — PROTIME-INR
INR: 1.04
Prothrombin Time: 13.6 seconds (ref 11.4–15.2)

## 2016-07-16 LAB — AMMONIA: Ammonia: 25 umol/L (ref 9–35)

## 2016-07-16 LAB — I-STAT TROPONIN, ED: Troponin i, poc: 0 ng/mL (ref 0.00–0.08)

## 2016-07-16 LAB — LIPASE, BLOOD: Lipase: 12 U/L (ref 11–51)

## 2016-07-16 MED ORDER — SODIUM CHLORIDE 0.9 % IV SOLN: Freq: Once | INTRAVENOUS | Status: DC

## 2016-07-16 MED ORDER — TRAZODONE HCL 50 MG PO TABS: 50.0000 mg | ORAL_TABLET | Freq: Every evening | ORAL | 0 refills | Status: DC | PRN

## 2016-07-16 MED ORDER — SODIUM CHLORIDE 0.9 % IV BOLUS (SEPSIS)
500.0000 mL | Freq: Once | INTRAVENOUS | Status: AC
  Administered 2016-07-16: 500 mL via INTRAVENOUS

## 2016-07-16 NOTE — Consult Note (Signed)
Medical Consultation   Austin Rose  XIP:[id number]  DOB: 06/23/1925  DOA: 07/16/2016  PCP: Lottie Dawson, MD   Outpatient Specialists:  none   Requesting physician: Dr. Johnney Killian  Reason for consultation: AMS - admission.    History of Present Illness: Austin Rose is an 83 y.o. male with past medical history of CAD, hypotension, hyperlipidemia, presenting with roughly 24-hour history of confusion and bizarre behavior. At baseline patient is able to perform most of his ADLs but does have in home PT and OT. On the day prior to onset of symptoms is reported by the family that he was feeling tired and took some of his "sleep medicines. " Note patient does not have significant medicines written for but does have Norco 01/16/2024 that he takes for pain. Of note patient does administer his own medications. Patient slept the majority of the day on Sunday but then was awake throughout the night Sunday night and into Monday morning. Bizarre behavior was noted throughout Monday as noted in EDP note. There have never been focal complaints such as dysuria, frequency, fevers, nausea, vomiting, abdominal pain, chest pain, short of breath, palpitations, neck stiffness, headache, focal neurological deficit such as unilateral extremity weakness or facial droop. Family also endorses that patient was [facility] and patient has inadvertently been taking both prescriptions. Symptoms of confusion have been constant since onset and are not better or worse since onset.  Review of Systems:  ROS As per HPI otherwise 10 point review of systems negative.    Past Medical History: Past Medical History:  Diagnosis Date  . Arthritis   . CAD (coronary artery disease)   . History of stress test 09/20/2008  . Hx of echocardiogram 07/20/2010   The cavity size was normal, there is mild aortic stenosis,the aortic root was normal is size, the  ascending aorta was normal in size.  . Hx of varicella   . Hyperlipemia   . Hypertension   . Orthostatic hypotension    [facility]  . Squamous cell skin cancer    pinna     Past Surgical History: Past Surgical History:  Procedure Laterality Date  . BACK SURGERY     15 s1Repeat Foraminotomy  . cataract surgery     digby   . CORONARY ARTERY BYPASS GRAFT  04/1999   Dr Roxan Hockey, showing 4 vessel disease and underwent multivessel CABG aththat time.  . LUMBAR DISC SURGERY     L4-5  decompression  . microdisscectomy     bilateral  . OTHER SURGICAL HISTORY     central decompression     Allergies:   Allergies  Allergen Reactions  . Gabapentin Other (See Comments)    Behavior changes noted by family  Better when stopped  2015  . Penicillins Other (See Comments)    unknown     Social History:  reports that he has quit smoking. His smokeless tobacco use includes Chew. He reports that he does not drink alcohol or use drugs.   Family History: Family History  Problem Relation Age of Onset  . Other Mother 61    brain aneursym  . Anuerysm Mother   . Heart attack Father      Physical Exam: Vitals:   07/16/16 1100 07/16/16 1216 07/16/16 1300 07/16/16 1428  BP: (!) 167/95 (!) 174/91 (!) 158/95 (!) 160/83  Pulse: 60  60 63 (!) 56  Resp: 19 19 15 20   Temp:      TempSrc:      SpO2: 100% 99% 99% 100%  Weight:      Height:        General:  Appears calm and comfortable Eyes:  PERRL, EOMI, normal lids, iris ENT: Tongue was thick white and brownish plaques throughout and somewhat of a stiffened appearance. Remainder oral mucosa normal. Hearing normal. Neck:  no LAD, masses or thyromegaly Cardiovascular: II/VI systolic murmur RRR,No LE edema.  Respiratory:  CTA bilaterally, no w/r/r. Normal respiratory effort. Abdomen:  soft, ntnd, NABS Skin:  no rash or induration seen on limited exam Musculoskeletal:  grossly normal tone BUE/BLE, good ROM, no bony  abnormality Psychiatric: Present. Follows commands. Intermittently answers questions appropriately. Alert and oriented times Neurologic:  CN 2-12 grossly intact, moves all extremities in coordinated fashion, sensation intact  Data reviewed:  I have personally reviewed following labs and imaging studies Labs:  CBC:  Recent Labs Lab 07/16/16 1103  WBC 4.8  NEUTROABS 2.8  HGB 13.2  HCT 40.1  MCV 90.5  PLT 448    Basic Metabolic Panel:  Recent Labs Lab 07/16/16 1103  NA 136  K 4.0  CL 104  CO2 23  GLUCOSE 93  BUN 10  CREATININE 1.09  CALCIUM 9.5   GFR Estimated Creatinine Clearance: 46.8 mL/min (by C-G formula based on SCr of 1.09 mg/dL). Liver Function Tests:  Recent Labs Lab 07/16/16 1103  AST 21  ALT 11*  ALKPHOS 70  BILITOT 1.2  PROT 6.5  ALBUMIN 3.9    Recent Labs Lab 07/16/16 1103  LIPASE 12    Recent Labs Lab 07/16/16 1103  AMMONIA 25   Coagulation profile  Recent Labs Lab 07/16/16 1103  INR 1.04    Cardiac Enzymes: No results for input(s): CKTOTAL, CKMB, CKMBINDEX, TROPONINI in the last 168 hours. BNP: Invalid input(s): POCBNP CBG: No results for input(s): GLUCAP in the last 168 hours. D-Dimer No results for input(s): DDIMER in the last 72 hours. Hgb A1c No results for input(s): HGBA1C in the last 72 hours. Lipid Profile No results for input(s): CHOL, HDL, LDLCALC, TRIG, CHOLHDL, LDLDIRECT in the last 72 hours. Thyroid function studies  Recent Labs  07/16/16 1103  TSH 4.659*   Anemia work up  Recent Labs  07/16/16 1103  VITAMINB12 2,008*   Urinalysis    Component Value Date/Time   COLORURINE YELLOW 07/16/2016 1102   APPEARANCEUR CLEAR 07/16/2016 1102   LABSPEC 1.010 07/16/2016 1102   PHURINE 7.0 07/16/2016 1102   GLUCOSEU NEGATIVE 07/16/2016 1102   HGBUR NEGATIVE 07/16/2016 1102   HGBUR negative 11/17/2009 [place] 07/16/2016 1102   BILIRUBINUR n 05/15/2015 1445   [place]  07/16/2016 1102   PROTEINUR NEGATIVE 07/16/2016 1102   UROBILINOGEN 1.0 05/15/2015 1445   UROBILINOGEN 0.2 07/17/2010 1338   NITRITE NEGATIVE 07/16/2016 1102   LEUKOCYTESUR NEGATIVE 07/16/2016 1102     Microbiology No results found for this or any previous visit (from the past 240 hour(s)).     Inpatient Medications:   Scheduled Meds: Continuous Infusions:   Radiological Exams on Admission: Ct Head Wo Contrast  Result Date: 07/16/2016 CLINICAL DATA:  Mental status changes EXAM: CT HEAD WITHOUT CONTRAST TECHNIQUE: Contiguous axial images were obtained from the base of the skull through the vertex without intravenous contrast. COMPARISON:  04/17/2016 FINDINGS: Brain: No intracranial hemorrhage, mass effect or midline shift. Stable cerebral atrophy. Stable periventricular  and patchy subcortical chronic white matter disease. No acute cortical infarction. No mass lesion is noted on this unenhanced scan. Vascular: Mild atherosclerotic calcifications of carotid siphon. Skull: No skull fracture is noted. Sinuses/Orbits: No acute findings Other: None IMPRESSION: No acute intracranial abnormality. Stable atrophy and chronic white matter disease. No definite acute cortical infarction. Electronically Signed   By: Lahoma Crocker M.D.   On: 07/16/2016 12:20    Impression/Recommendations Active Problems:   Hypothyroidism   Hyperlipidemia   CAD (coronary artery disease) of artery bypass graft   Confusion   Cyanocobalamin deficiency  COnfusion: Likely primarily due to underlying undiagnosed early dementia in the setting of opioid overdose and complete disruption of sleep wake cycle. Extensive workup performed by EDP reassuring along with history the patient does not have underlying infection, seizure, stroke/intracranial process or metabolic derangement causing symptoms. Discussed findings and treatment options extensively with patient's daughter who assists in his caregiving and she is agreeable with  conservative management including watching patient at home, encouraging more typical sleep wake cycle, holding narcotic and only continuing when symptoms resolve at a decreased dose. Discussed reasons to return including worsening mental status or other focal complaints concerning for infection or stroke. Of note discussed case with case management and social work who have helps to ensure family has home health to fill patient's needs. Of note patient artery had some components of home health in place. Family will monitor and dose patient's medications as he likely unintentionally overdosed on his narcotic leading to his ED evaluation. [place] patient use trazodone 50 mg daily at bedtime as needed for sleep.  Hypertension: Mildly elevated during time in the ED. OK to resume midodrine in 07/17/16  CAD: continue statin and ASA at home.   Hypercyanocobalamin: Hold home B12. Pt apparently taking double dose due to repeating prescriptions given by the [place]. [name]  Tongue plaque: Excessive chewing tobacco use history. Follow-up with ENT.  Thank you for this consultation.  Our [facility] hospitalist team will follow the patient with you.     MERRELL, DAVID J M.D. [facility] 07/16/2016, 2:59 PM

## 2016-07-16 NOTE — ED Triage Notes (Signed)
Pt brought in by EMS for AMS. Per EMS pt was only a&ox1. LSN was Sunday night around 9 p.m. No neuro deficits seen. Pt has no complaints at this time.

## 2016-07-16 NOTE — Telephone Encounter (Signed)
Pt being evaluated in ED.

## 2016-07-16 NOTE — Discharge Planning (Signed)
Pt currently active with [facility] for PT/OT/Speech services.  Resumption of care requested adding RN and NA. [facility] notified.  No DME needs identified at this time.

## 2016-07-16 NOTE — ED Provider Notes (Signed)
[facility] DEPT Provider Note   CSN: [id number] Arrival date & time: 07/16/16  9390     History   Chief Complaint Chief Complaint  Patient presents with  . Altered Mental Status    HPI Austin Rose is a 83 y.o. male.  HPI Patient was at normal baseline Easter "Sunday, 2 days ago. At baseline he uses a cane but functions independently at home. He had baked a pineapple cake for their family meal that day. Yesterday he developed bizarre behavior and severe confusion. His daughter reports that he didn't recognize what his cell phone was. His speech is not made sense. He was awake much of the night and urinated in the dining room on the floor. He has still been up and ambulating but he seems more unsteady and has used a walker that they have at home. All of these things are completely out of character. Family has not noticed that he has one deficit in terms of extremity use. They deny that he was sick leading up to this. Review of systems with wife and daughter is negative. Patient is a regular tobacco chewer but does not use any alcohol. No medication changes either edition or removed. No falls or injuries. Past Medical History:  Diagnosis Date  . Arthritis   . CAD (coronary artery disease)   . History of stress test 09/20/2008  . Hx of echocardiogram 07/20/2010   The cavity size was normal, there is mild aortic stenosis,the aortic root was normal is size, the ascending aorta was normal in size.  . Hx of varicella   . Hyperlipemia   . Hypertension   . Orthostatic hypotension    [facility]  . Squamous cell skin cancer    pinna     Patient Active Problem List   Diagnosis Date Noted  . History of cerebral infarction 06/04/2016  . Hearing decreased, unspecified laterality 06/04/2016  . Restless legs 06/04/2016  . Multiple lacunar infarcts (HCC) 04/30/2016  . Calculus of gallbladder without cholecystitis without obstruction 04/30/2016  . Thrombocytopenia (HCC)  04/30/2016  . Expressive aphasia 04/18/2016  . CAD (coronary artery disease) of artery bypass graft 04/18/2016  . TIA (transient ischemic attack) 04/17/2016  . Senile ecchymosis 09/20/2014  . Hyperlipidemia 06/07/2014  . Primary osteoarthritis involving multiple joints 01/20/2014  . Coronary artery disease due to lipid rich plaque 01/20/2014  . Age factor 01/20/2014  . Medication side effect 01/20/2014  . Smokeless tobacco use within past 30 days 06/03/2013  . Skin abnormalities 05/18/2013  . Pain management 05/18/2013  . Abnormal TSH 05/18/2013  . Unspecified vitamin D deficiency 05/18/2013  . Viral URI with cough 11/04/2012  . Sleep difficulties 11/06/2011  . High risk medication use 04/10/2011  . Chronic orthostatic hypotension 05/16/2010  . Labyrinthitis 05/16/2010  . DIZZINESS 02/23/2009  . DEGENERATIVE JOINT DISEASE, SHOULDER 07/20/2008  . ARTHRITIS, KNEES, BILATERAL 07/20/2008  . FROZEN RIGHT SHOULDER 07/20/2008  . Hypothyroidism 11/11/2007  . Hereditary and idiopathic peripheral neuropathy 11/11/2007  . CONSTIPATION, CHRONIC 11/11/2007  . OSTEOPENIA 11/11/2007  . GASTRIC ULCER, HX OF 11/11/2007  . HYPERLIPIDEMIA 03/31/2007  . Essential hypertension 03/31/2007  . CORONARY ARTERY DISEASE 03/31/2007  . IBS 03/31/2007    Past Surgical History:  Procedure Laterality Date  . BACK SURGERY     15" s1Repeat Foraminotomy  . cataract surgery     digby   . CORONARY ARTERY BYPASS GRAFT  04/1999   Dr Roxan Hockey, showing 4 vessel disease and underwent multivessel CABG aththat  time.  . LUMBAR DISC SURGERY     L4-5  decompression  . microdisscectomy     bilateral  . OTHER SURGICAL HISTORY     central decompression       Home Medications    Prior to Admission medications   Medication Sig Start Date End Date Taking? Authorizing Provider  aspirin 325 MG tablet Take 1 tablet (325 mg total) by mouth daily. 04/20/16  Yes Theodis Blaze, MD  Cyanocobalamin (B-12 PO) Take 1  tablet by mouth 2 (two) times daily.    Yes Historical Provider, MD  HYDROcodone-acetaminophen (NORCO) 10-325 MG tablet Take 1 tablet by mouth as needed for pain.  Maximum of every 6 hours. 06/13/16  Yes Burnis Medin, MD  midodrine (PROAMATINE) 5 MG tablet Take 5 mg by mouth 2 (two) times daily with a meal. 1 tab  8AM, 1 PM and 1 at 6 PM- Pt gets thru the [facility]  08/16/10  Yes Historical Provider, MD  SIMVASTATIN PO Take 0.5 tablets by mouth daily.   Yes Historical Provider, MD  HYDROcodone-acetaminophen (NORCO) 10-325 MG tablet Take 1 tablet by mouth as needed for pain.  Maximum of every 6 hours. Patient not taking: Reported on 07/16/2016 07/05/16   Burnis Medin, MD  traZODone (DESYREL) 50 MG tablet Take 1 tablet (50 mg total) by mouth at bedtime as needed for sleep. 07/16/16   Charlesetta Shanks, MD    Family History Family History  Problem Relation Age of Onset  . Other Mother 18    brain aneursym  . Anuerysm Mother   . Heart attack Father     Social History Social History  Substance Use Topics  . Smoking status: Former Research scientist (life sciences)  . Smokeless tobacco: Current User    Types: Chew    Last attempt to quit: 04/15/1978  . Alcohol use No     Allergies   Gabapentin and Penicillins   Review of Systems Review of Systems 10 Systems reviewed and are negative for acute change except as noted in the HPI.   Physical Exam Updated Vital Signs BP (!) 160/83 (BP Location: Right Arm)   Pulse (!) 56   Temp 98.8 F (37.1 C) (Oral)   Resp 20   Ht 5\' 9"  (1.753 m)   Wt 160 lb (72.6 kg)   SpO2 100%   BMI 23.63 kg/m   Physical Exam  Constitutional: He appears well-developed and well-nourished.  Patient is very alert. He has no respiratory distress. He does however appear confused and is picking at his blanket and monitors. Color is good. He is interacting with family members but speech doesn't make a lot of sense for situation.  HENT:  Head: Normocephalic and atraumatic.  Patient has thick plaque of  brown tinged material on his tongue. Significant glossitis of the tongue. Posterior soft palate has diffuse erythema.  Eyes: EOM are normal. Pupils are equal, round, and reactive to light.  Cardiovascular: Normal rate, regular rhythm, normal heart sounds and intact distal pulses.   Pulmonary/Chest: Effort normal.  Abdominal: Soft. He exhibits no distension. There is no tenderness. There is no guarding.  Musculoskeletal: Normal range of motion. He exhibits no edema or tenderness.  Neurological: He is alert.  Patient is very alert but cannot answer any questions about place or time. He cannot name objects such as a telephone and the television. He does speak to his family but speech is not situationally appropriate. He is cooperative and following commands for physical examination. No  focal motor deficit. Grip strength 5\5 upper and lower. Cannot assess visual field testing. Patient does not distract by me obscuring his vision with my gloved hand. He however can obviously see me as when I move around and moves a light in all positions he follows that with his eyes and he follows my face.  Skin: Skin is warm and dry.     ED Treatments / Results  Labs (all labs ordered are listed, but only abnormal results are displayed) Labs Reviewed  COMPREHENSIVE METABOLIC PANEL - Abnormal; Notable for the following:       Result Value   ALT 11 (*)    GFR calc non Af Amer 59 (*)    All other components within normal limits  RAPID URINE DRUG SCREEN, HOSP PERFORMED - Abnormal; Notable for the following:    Opiates POSITIVE (*)    All other components within normal limits  VITAMIN B12 - Abnormal; Notable for the following:    Vitamin B-12 2,008 (*)    All other components within normal limits  TSH - Abnormal; Notable for the following:    TSH 4.659 (*)    All other components within normal limits  LIPASE, BLOOD  CBC WITH DIFFERENTIAL/PLATELET  PROTIME-INR  URINALYSIS, ROUTINE W REFLEX MICROSCOPIC    AMMONIA  VITAMIN B1  VITAMIN B6  I-STAT TROPOININ, ED    EKG  EKG Interpretation None       Radiology Ct Head Wo Contrast  Result Date: 07/16/2016 CLINICAL DATA:  Mental status changes EXAM: CT HEAD WITHOUT CONTRAST TECHNIQUE: Contiguous axial images were obtained from the base of the skull through the vertex without intravenous contrast. COMPARISON:  04/17/2016 FINDINGS: Brain: No intracranial hemorrhage, mass effect or midline shift. Stable cerebral atrophy. Stable periventricular and patchy subcortical chronic white matter disease. No acute cortical infarction. No mass lesion is noted on this unenhanced scan. Vascular: Mild atherosclerotic calcifications of carotid siphon. Skull: No skull fracture is noted. Sinuses/Orbits: No acute findings Other: None IMPRESSION: No acute intracranial abnormality. Stable atrophy and chronic white matter disease. No definite acute cortical infarction. Electronically Signed   By: Lahoma Crocker M.D.   On: 07/16/2016 12:20    Procedures Procedures (including critical care time)  Medications Ordered in ED Medications  0.9 %  sodium chloride infusion (not administered)  sodium chloride 0.9 % bolus 500 mL (0 mLs Intravenous Stopped 07/16/16 1222)     Initial Impression / Assessment and Plan / ED Course  I have reviewed the triage vital signs and the nursing notes.  Pertinent labs & imaging results that were available during my care of the patient were reviewed by me and considered in my medical decision making (see chart for details).     Consultation: Review Dr. Linna Darner. He has assessed the patient and family and he believes this is likely due to overuse of the patient's hydrocodone. Family suspects that he took excessive amount to try to sleep. Dr. Barbaraann Faster and the family have made a plan whereby the patient will return home for supervision and prescribe trazodone for nighttime sleep as the patient's sleep-wake cycle is now completely  deranged.  Final Clinical Impressions(s) / ED Diagnoses   Final diagnoses:  Delirium   Patient presents as outlined above with severe confusion consistent with delirium. No acute etiology is identified. Auscultation placed to [facility] for evaluation for admission for mental status change. After Dr. Bertis Ruddy evaluation, there is high clinical suspicion for overuse of hydrocodone and incipient delirium and  day night reversal. He has discussed management with the family and they report they're comfortable taking the patient home for supervision. Dr. Barbaraann Faster will also be consulted in case manager to have in-home assistance. Patient is alert and confused at discharge. New Prescriptions New Prescriptions   TRAZODONE (DESYREL) 50 MG TABLET    Take 1 tablet (50 mg total) by mouth at bedtime as needed for sleep.     Charlesetta Shanks, MD 07/16/16 [phone]

## 2016-07-16 NOTE — Telephone Encounter (Signed)
Pt's daughter called to report that during the night pt has become more and more confused. He got up in the night and urinated in the floor. He does not know what a cell phone is or what it is for. He is not acting like himself at all. She is concerned that pt may be having a stroke and was insisting that he needed to be seen in the office. Advised daughter that she needs to either call EMS or take pt to ED for evaluation. She does not want to take him to ED due to possible long wait time. Advised her that with possible stroke symptoms he would likely not wait, however the fastest and safest route would be to call EMS for assistance. She finally agreed and states she will call them immediately.

## 2016-07-16 NOTE — Discharge Instructions (Signed)
At this time, confusion is suspected to be due to overuse of your pain medication, hydrocodone. Discontinue hydrocodone-acetaminophen (Norco or Vicodin). You may try trazodone at bedtime to help with sleep. See your doctor as soon as possible for recheck.

## 2016-07-18 ENCOUNTER — Telehealth: Payer: Self-pay | Admitting: Family Medicine

## 2016-07-18 ENCOUNTER — Telehealth: Payer: Self-pay | Admitting: Internal Medicine

## 2016-07-18 LAB — VITAMIN B6: Vitamin B6: 2.8 ug/L — ABNORMAL LOW (ref 5.3–46.7)

## 2016-07-18 LAB — VITAMIN B1: Vitamin B1 (Thiamine): 104.4 nmol/L (ref 66.5–200.0)

## 2016-07-18 NOTE — Telephone Encounter (Signed)
° ° °  [name] with [facility] call to say patient wife did not want them to come out today. [name] said she told them they can start 07/19/16.       [phone]

## 2016-07-18 NOTE — Telephone Encounter (Signed)
NURSE CALLED TO SEE IF PT WAS ONE OF OUR PT I ADVISED THEM THAT HE HASNT BEEN HERE LOOK LIKE OVER 5 YRS

## 2016-07-19 DIAGNOSIS — G92 Toxic encephalopathy: Secondary | ICD-10-CM | POA: Diagnosis not present

## 2016-07-19 DIAGNOSIS — E039 Hypothyroidism, unspecified: Secondary | ICD-10-CM | POA: Diagnosis not present

## 2016-07-19 DIAGNOSIS — I2510 Atherosclerotic heart disease of native coronary artery without angina pectoris: Secondary | ICD-10-CM | POA: Diagnosis not present

## 2016-07-19 DIAGNOSIS — E559 Vitamin D deficiency, unspecified: Secondary | ICD-10-CM | POA: Diagnosis not present

## 2016-07-19 DIAGNOSIS — I10 Essential (primary) hypertension: Secondary | ICD-10-CM | POA: Diagnosis not present

## 2016-07-19 DIAGNOSIS — M170 Bilateral primary osteoarthritis of knee: Secondary | ICD-10-CM | POA: Diagnosis not present

## 2016-07-22 NOTE — Telephone Encounter (Signed)
[name] from [facility] stated that she did go see the pt Friday. Nothing further needed

## 2016-07-25 DIAGNOSIS — I10 Essential (primary) hypertension: Secondary | ICD-10-CM | POA: Diagnosis not present

## 2016-07-25 DIAGNOSIS — G92 Toxic encephalopathy: Secondary | ICD-10-CM | POA: Diagnosis not present

## 2016-07-25 DIAGNOSIS — E559 Vitamin D deficiency, unspecified: Secondary | ICD-10-CM | POA: Diagnosis not present

## 2016-07-25 DIAGNOSIS — M170 Bilateral primary osteoarthritis of knee: Secondary | ICD-10-CM | POA: Diagnosis not present

## 2016-07-25 DIAGNOSIS — E039 Hypothyroidism, unspecified: Secondary | ICD-10-CM | POA: Diagnosis not present

## 2016-07-25 DIAGNOSIS — I2510 Atherosclerotic heart disease of native coronary artery without angina pectoris: Secondary | ICD-10-CM | POA: Diagnosis not present

## 2016-08-02 ENCOUNTER — Telehealth: Payer: Self-pay | Admitting: Internal Medicine

## 2016-08-02 DIAGNOSIS — I10 Essential (primary) hypertension: Secondary | ICD-10-CM | POA: Diagnosis not present

## 2016-08-02 DIAGNOSIS — E039 Hypothyroidism, unspecified: Secondary | ICD-10-CM | POA: Diagnosis not present

## 2016-08-02 DIAGNOSIS — G92 Toxic encephalopathy: Secondary | ICD-10-CM | POA: Diagnosis not present

## 2016-08-02 DIAGNOSIS — E559 Vitamin D deficiency, unspecified: Secondary | ICD-10-CM | POA: Diagnosis not present

## 2016-08-02 DIAGNOSIS — I2510 Atherosclerotic heart disease of native coronary artery without angina pectoris: Secondary | ICD-10-CM | POA: Diagnosis not present

## 2016-08-02 DIAGNOSIS — M170 Bilateral primary osteoarthritis of knee: Secondary | ICD-10-CM | POA: Diagnosis not present

## 2016-08-02 NOTE — Telephone Encounter (Signed)
Austin Rose w/[facility] wanted to report pt has not been taking his midodrine.

## 2016-08-02 NOTE — Telephone Encounter (Signed)
Noted  This is prescribed by the [facility]

## 2016-08-02 NOTE — Telephone Encounter (Signed)
FYI

## 2016-08-07 NOTE — Progress Notes (Signed)
Chief Complaint  Patient presents with  . Follow-up    HPI: Austin Rose 83 y.o. come in forfu behavior change  delerium ed visit    Felt to be from  Hydrocodone and ucnertain  How much took    He is here with his wife today. Who has been following him and his intake of medication and encouraging to have fluids and good nutrition. Since discharge he has not been taking the Midrin [facility] blood pressure tends to run low but no fainting Constipation seems to be better Trazodone given to him for sleep. They have not filled or taken because he says he sleeps fine. Wife is giving him a half a hydrocodone at night seems to help He still states that the reason his complaints of his legs at night. He has no neuropathy. He is apparently taking B12 or B vitamins and was told he had a very high level in the emergency room. He recently got a walker and some knee braces from his [facility] but no decision or discussion about the Midrin. Wife thinks he is feeling better and is acting more normal back to baseline after 2 days.  ROS: See pertinent positives and negatives per HPI.  See below    Notes for ED evaluation  Initial Impression / Assessment and Plan / ED Course   Consultation: Review Dr. Linna Darner. He has assessed the patient and family and he believes this is likely due to overuse of the patient's hydrocodone. Family suspects that he took excessive amount to try to sleep. Dr. Barbaraann Faster and the family have made a plan whereby the patient will return home for supervision and prescribe trazodone for nighttime sleep as the patient's sleep-wake cycle is now completely deranged. Final Clinical Impressions(s) / ED Diagnoses   Final diagnoses:  Delirium   Patient presents as outlined above with severe confusion consistent with delirium. No acute etiology is identified. Auscultation placed to [facility] hospitalist for evaluation for admission for mental status change. After Dr.  Bertis Ruddy evaluation, there is high clinical suspicion for overuse of hydrocodone and incipient delirium and day night reversal. He has discussed management with the family and they report they're comfortable taking the patient home for supervision. Dr. Barbaraann Faster will also be consulted in case manager to have in-home assistance. Patient is alert and confused at discharge. New Prescriptions     New Prescriptions   TRAZODONE (DESYREL) 50 MG TABLET    Take 1 tablet (50 mg total) by mouth at bedtime as needed for sleep.     Austin Shanks, MD 07/16/16 1432   Past Medical History:  Diagnosis Date  . Arthritis   . CAD (coronary artery disease)   . History of stress test 09/20/2008  . Hx of echocardiogram 07/20/2010   The cavity size was normal, there is mild aortic stenosis,the aortic root was normal is size, the ascending aorta was normal in size.  [name] Hx of varicella   . Hyperlipemia   . Hypertension   . Orthostatic hypotension    on mitodrine per [place]  . Squamous cell skin cancer    pinna     Family History  Problem Relation Age of Onset  . Other Mother 68    brain aneursym  . Anuerysm Mother   . Heart attack Father     Social History   Social History  . Marital status: Married    Spouse name: [name]  . Number of children: [name]  .  Years of education: [name]   Social History Main Topics  . Smoking status: Former Research scientist (life sciences)  . Smokeless tobacco: Current User    Types: Chew    Last attempt to quit: 04/15/1978  . Alcohol use No  . Drug use: No  . Sexual activity: No   Other Topics Concern  . 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   2 use tobacco no significant alcohol   No ets   Has fa                Outpatient Medications Prior to Visit  Medication Sig Dispense Refill  . aspirin 325 MG tablet Take 1 tablet (325 mg total) by mouth  daily.    . Cyanocobalamin (B-12 PO) Take 1 tablet by mouth 2 (two) times daily.     [name] HYDROcodone-acetaminophen (NORCO) 10-325 MG tablet Take 1 tablet by mouth as needed for pain.  Maximum of every 6 hours. 10 tablet 0  . SIMVASTATIN PO Take 0.5 tablets by mouth daily.    . midodrine (PROAMATINE) 5 MG tablet Take 5 mg by mouth 2 (two) times daily with a meal. 1 tab  8AM, 1 PM and 1 at 6 PM- Pt gets thru the [place]     . traZODone (DESYREL) 50 MG tablet Take 1 tablet (50 mg total) by mouth at bedtime as needed for sleep. (Patient not taking: Reported on 08/09/2016) 30 tablet 0  . HYDROcodone-acetaminophen (NORCO) 10-325 MG tablet Take 1 tablet by mouth as needed for pain.  Maximum of every 6 hours. (Patient not taking: Reported on 07/16/2016) 90 tablet 0   No facility-administered medications prior to visit.      EXAM:  BP 104/60   Pulse (!) 58   Temp 97.8 F (36.6 C) (Oral)   Ht '5\' 9"'  (1.753 m)   Wt 163 lb 6.4 oz (74.1 kg)   BMI 24.13 kg/m   Body mass index is 24.13 kg/m.  GENERAL: vitals reviewed and listed above, alert, oriented, appears well hydrated and in no acute distress walks with a cane ambulatory independent HEENT: atraumatic, conjunctiva  clear, no obvious abnormalities on inspection of external nose and ears  NECK: no obvious masses on inspection palpation  LUNGS: clear to auscultation bilaterally, no wheezes, rales or rhonchi, good air movement CV: HRRR, no clubbing cyanosis or  peripheral edema nl cap refill  MS: moves all extremities without noticeable focal  abnormality flat-footed gait neuropathic. PSYCH: pleasant and cooperative, no obvious depression or anxiety Speech nl  Disc with  Wife  Etc .  Lab Results  Component Value Date   WBC 4.8 07/16/2016   HGB 13.2 07/16/2016   HCT 40.1 07/16/2016   PLT 179 07/16/2016   GLUCOSE 93 07/16/2016   CHOL 88 04/18/2016   TRIG 41 04/18/2016   HDL 37 (L) 04/18/2016   LDLCALC 43 04/18/2016   ALT 11 (L) 07/16/2016   AST 21  07/16/2016   NA 136 07/16/2016   K 4.0 07/16/2016   CL 104 07/16/2016   CREATININE 1.09 07/16/2016   BUN 10 07/16/2016   CO2 23 07/16/2016   TSH 4.659 (H) 07/16/2016   PSA 0.49 Test Methodology: [employer] PSA 07/18/2010   INR 1.04 07/16/2016   HGBA1C 5.1 04/18/2016   BP  Readings from Last 3 Encounters:  08/09/16 104/60  07/16/16 (!) 160/83  06/04/16 122/72   Wt Readings from Last 3 Encounters:  08/09/16 163 lb 6.4 oz (74.1 kg)  07/16/16 160 lb (72.6 kg)  06/04/16 160 lb 1.6 oz (72.6 kg)     ASSESSMENT AND PLAN:  Discussed the following assessment and plan:  Restless legs  Thrombocytopenia (HCC)  Hereditary and idiopathic peripheral neuropathy  Medication management  Advanced age  Elevated TSH - prob clincally insignificant   Hypotension, unspecified hypotension type - adequate today   Vitamin B6 deficiency (non anemic) ? - highi G99 and lo b6 uncertain what he was taking  but now jsut multi vit and chjeck lavel at next visit  [place] who has been on oopiate for years before  I met him  And had been  "fucntioning" and was resistant to  Go ing off  For this however this event  Portends  Risk more than benefit  And wife is now " in charge of meds"   Weaning Was discussed with hinm and need   medication oversight.  However   Now family involved   He has  Split care because of the [place]  And unfotuantely we dont have  ehr acess to that care with makes continuing problematic   .   At this time we'll try tramadol at night which may be better for restless leg although is an opiate has risk may be better choice than hydrocodone.  he states that he sleeps well and doesn't need the trazodone. Encouraged good nutrition and hydration. His B12 level was very high and his B6 level was low. May repeat these at follow-up after continued appropriate nutrition and hydration and minimal medication.  -Patient advised to return or notify health care team  if  new concerns arise.  Patient  Instructions   I would like to try a different medicine at night for restless leg sleep. It's not as strong of the pain pill but it might help restless leg. Medicine still needs to be watched carefully risk of getting too much overdose or what we called cognitive effects. At this time I am not restarting the Mitodrine but would have you [facility]. Blood pressure is 104 today in the office. Attend to good nutrition and hydration as you discussed. Agree with other person monitoring medications to avoid taking too much or incorrectly. Suggest someone go to his visits to help with accuracy of medical plans.  Stop the hydrocodone  Take tramadol at night   For now     Plan ROV in 1-2 months  Or as needed  We may do blood tests at that visit  Your b12 lwevel was too huigh and b6 was somewhat  l ow .        Standley Brooking. Peniel Hass M.D.

## 2016-08-08 ENCOUNTER — Telehealth: Payer: Self-pay | Admitting: Internal Medicine

## 2016-08-08 NOTE — Telephone Encounter (Signed)
[name] with [name] would like dr Regis Bill to know pt refused his PT appt today.  [name] aware pt has appt tomorrow with Dr Regis Bill.

## 2016-08-09 ENCOUNTER — Encounter: Payer: Self-pay | Admitting: Internal Medicine

## 2016-08-09 ENCOUNTER — Ambulatory Visit (INDEPENDENT_AMBULATORY_CARE_PROVIDER_SITE_OTHER): Payer: Medicare HMO | Admitting: Internal Medicine

## 2016-08-09 VITALS — BP 104/60 | HR 58 | Temp 97.8°F | Ht 69.0 in | Wt 163.4 lb

## 2016-08-09 DIAGNOSIS — I959 Hypotension, unspecified: Secondary | ICD-10-CM | POA: Diagnosis not present

## 2016-08-09 DIAGNOSIS — E531 Pyridoxine deficiency: Secondary | ICD-10-CM

## 2016-08-09 DIAGNOSIS — R54 Age-related physical debility: Secondary | ICD-10-CM

## 2016-08-09 DIAGNOSIS — G609 Hereditary and idiopathic neuropathy, unspecified: Secondary | ICD-10-CM | POA: Diagnosis not present

## 2016-08-09 DIAGNOSIS — Z79899 Other long term (current) drug therapy: Secondary | ICD-10-CM

## 2016-08-09 DIAGNOSIS — D696 Thrombocytopenia, unspecified: Secondary | ICD-10-CM

## 2016-08-09 DIAGNOSIS — G2581 Restless legs syndrome: Secondary | ICD-10-CM | POA: Diagnosis not present

## 2016-08-09 DIAGNOSIS — R946 Abnormal results of thyroid function studies: Secondary | ICD-10-CM | POA: Diagnosis not present

## 2016-08-09 DIAGNOSIS — R7989 Other specified abnormal findings of blood chemistry: Secondary | ICD-10-CM

## 2016-08-09 MED ORDER — TRAMADOL HCL 50 MG PO TABS: 50.0000 mg | ORAL_TABLET | Freq: Every day | ORAL | 1 refills | Status: DC

## 2016-08-09 NOTE — Telephone Encounter (Signed)
FYI

## 2016-08-09 NOTE — Patient Instructions (Addendum)
  I would like to try a different medicine at night for restless leg sleep. It's not as strong of the pain pill but it might help restless leg. Medicine still needs to be watched carefully risk of getting too much overdose or what we called cognitive effects. At this time I am not restarting the Mitodrine but would have you [facility]. Blood pressure is 104 today in the office. Attend to good nutrition and hydration as you discussed. Agree with other person monitoring medications to avoid taking too much or incorrectly. Suggest someone go to his visits to help with accuracy of medical plans.  Stop the hydrocodone  Take tramadol at night   For now     Plan ROV in 1-2 months  Or as needed  We may do blood tests at that visit  Your b12 lwevel was too huigh and b6 was somewhat  l ow .

## 2016-08-12 ENCOUNTER — Telehealth: Payer: Self-pay | Admitting: Internal Medicine

## 2016-08-12 DIAGNOSIS — E559 Vitamin D deficiency, unspecified: Secondary | ICD-10-CM | POA: Diagnosis not present

## 2016-08-12 DIAGNOSIS — G92 Toxic encephalopathy: Secondary | ICD-10-CM | POA: Diagnosis not present

## 2016-08-12 DIAGNOSIS — I2510 Atherosclerotic heart disease of native coronary artery without angina pectoris: Secondary | ICD-10-CM | POA: Diagnosis not present

## 2016-08-12 DIAGNOSIS — E039 Hypothyroidism, unspecified: Secondary | ICD-10-CM | POA: Diagnosis not present

## 2016-08-12 DIAGNOSIS — I10 Essential (primary) hypertension: Secondary | ICD-10-CM | POA: Diagnosis not present

## 2016-08-12 DIAGNOSIS — M170 Bilateral primary osteoarthritis of knee: Secondary | ICD-10-CM | POA: Diagnosis not present

## 2016-08-12 NOTE — Telephone Encounter (Signed)
Austin Rose with [name] state that the pt is not wanting to grip with his L hand when asked to do so. Austin Rose would think it may be some TIL going on not sure all other vitals are normal.

## 2016-08-13 ENCOUNTER — Encounter (HOSPITAL_COMMUNITY): Payer: Self-pay

## 2016-08-13 ENCOUNTER — Emergency Department (HOSPITAL_COMMUNITY): Payer: Medicare HMO

## 2016-08-13 ENCOUNTER — Emergency Department (HOSPITAL_COMMUNITY)
Admission: EM | Admit: 2016-08-13 | Discharge: 2016-08-13 | Disposition: A | Discharge status: Home / Self Care | Payer: Medicare HMO | Attending: Emergency Medicine | Admitting: Emergency Medicine

## 2016-08-13 DIAGNOSIS — I10 Essential (primary) hypertension: Secondary | ICD-10-CM | POA: Diagnosis not present

## 2016-08-13 DIAGNOSIS — I2510 Atherosclerotic heart disease of native coronary artery without angina pectoris: Secondary | ICD-10-CM | POA: Diagnosis not present

## 2016-08-13 DIAGNOSIS — F11121 Opioid abuse with intoxication delirium: Secondary | ICD-10-CM | POA: Diagnosis not present

## 2016-08-13 DIAGNOSIS — S46801A Unspecified injury of other muscles, fascia and tendons at shoulder and upper arm level, right arm, initial encounter: Secondary | ICD-10-CM | POA: Diagnosis not present

## 2016-08-13 DIAGNOSIS — M25511 Pain in right shoulder: Secondary | ICD-10-CM | POA: Diagnosis not present

## 2016-08-13 DIAGNOSIS — Z87891 Personal history of nicotine dependence: Secondary | ICD-10-CM | POA: Diagnosis not present

## 2016-08-13 DIAGNOSIS — Y929 Unspecified place or not applicable: Secondary | ICD-10-CM | POA: Diagnosis not present

## 2016-08-13 DIAGNOSIS — Z8673 Personal history of transient ischemic attack (TIA), and cerebral infarction without residual deficits: Secondary | ICD-10-CM | POA: Diagnosis not present

## 2016-08-13 DIAGNOSIS — Z7982 Long term (current) use of aspirin: Secondary | ICD-10-CM | POA: Insufficient documentation

## 2016-08-13 DIAGNOSIS — Y999 Unspecified external cause status: Secondary | ICD-10-CM | POA: Diagnosis not present

## 2016-08-13 DIAGNOSIS — Z951 Presence of aortocoronary bypass graft: Secondary | ICD-10-CM | POA: Diagnosis not present

## 2016-08-13 DIAGNOSIS — W06XXXA Fall from bed, initial encounter: Secondary | ICD-10-CM | POA: Insufficient documentation

## 2016-08-13 DIAGNOSIS — S2231XA Fracture of one rib, right side, initial encounter for closed fracture: Secondary | ICD-10-CM | POA: Insufficient documentation

## 2016-08-13 DIAGNOSIS — Y939 Activity, unspecified: Secondary | ICD-10-CM | POA: Insufficient documentation

## 2016-08-13 DIAGNOSIS — R0781 Pleurodynia: Secondary | ICD-10-CM | POA: Diagnosis not present

## 2016-08-13 DIAGNOSIS — S4991XA Unspecified injury of right shoulder and upper arm, initial encounter: Secondary | ICD-10-CM | POA: Diagnosis not present

## 2016-08-13 DIAGNOSIS — Z79899 Other long term (current) drug therapy: Secondary | ICD-10-CM | POA: Insufficient documentation

## 2016-08-13 DIAGNOSIS — F11921 Opioid use, unspecified with intoxication delirium: Secondary | ICD-10-CM

## 2016-08-13 DIAGNOSIS — R404 Transient alteration of awareness: Secondary | ICD-10-CM | POA: Diagnosis not present

## 2016-08-13 DIAGNOSIS — R410 Disorientation, unspecified: Secondary | ICD-10-CM | POA: Insufficient documentation

## 2016-08-13 DIAGNOSIS — M545 Low back pain: Secondary | ICD-10-CM | POA: Diagnosis not present

## 2016-08-13 DIAGNOSIS — E039 Hypothyroidism, unspecified: Secondary | ICD-10-CM | POA: Insufficient documentation

## 2016-08-13 DIAGNOSIS — W19XXXA Unspecified fall, initial encounter: Secondary | ICD-10-CM

## 2016-08-13 DIAGNOSIS — S0990XA Unspecified injury of head, initial encounter: Secondary | ICD-10-CM | POA: Diagnosis not present

## 2016-08-13 DIAGNOSIS — S39012A Strain of muscle, fascia and tendon of lower back, initial encounter: Secondary | ICD-10-CM | POA: Diagnosis not present

## 2016-08-13 DIAGNOSIS — S299XXA Unspecified injury of thorax, initial encounter: Secondary | ICD-10-CM | POA: Diagnosis not present

## 2016-08-13 DIAGNOSIS — F10121 Alcohol abuse with intoxication delirium: Secondary | ICD-10-CM | POA: Diagnosis not present

## 2016-08-13 DIAGNOSIS — R030 Elevated blood-pressure reading, without diagnosis of hypertension: Secondary | ICD-10-CM | POA: Diagnosis not present

## 2016-08-13 DIAGNOSIS — T148XXA Other injury of unspecified body region, initial encounter: Secondary | ICD-10-CM

## 2016-08-13 LAB — CBC WITH DIFFERENTIAL/PLATELET
BASOS PCT: 1 %
Basophils Absolute: 0 10*3/uL (ref 0.0–0.1)
EOS ABS: 0.2 10*3/uL (ref 0.0–0.7)
Eosinophils Relative: 3 %
HCT: 38.7 % — ABNORMAL LOW (ref 39.0–52.0)
Hemoglobin: 12.3 g/dL — ABNORMAL LOW (ref 13.0–17.0)
Lymphocytes Relative: 30 %
Lymphs Abs: 1.6 10*3/uL (ref 0.7–4.0)
MCH: 29.4 pg (ref 26.0–34.0)
MCHC: 31.8 g/dL (ref 30.0–36.0)
MCV: 92.4 fL (ref 78.0–100.0)
MONO ABS: 0.4 10*3/uL (ref 0.1–1.0)
MONOS PCT: 7 %
Neutro Abs: 3.2 10*3/uL (ref 1.7–7.7)
Neutrophils Relative %: 59 %
Platelets: 157 10*3/uL (ref 150–400)
RBC: 4.19 MIL/uL — ABNORMAL LOW (ref 4.22–5.81)
RDW: 14.6 % (ref 11.5–15.5)
WBC: 5.4 10*3/uL (ref 4.0–10.5)

## 2016-08-13 LAB — BASIC METABOLIC PANEL
Anion gap: 7 (ref 5–15)
BUN: 12 mg/dL (ref 6–20)
CALCIUM: 9.4 mg/dL (ref 8.9–10.3)
CO2: 22 mmol/L (ref 22–32)
CREATININE: 1.15 mg/dL (ref 0.61–1.24)
Chloride: 111 mmol/L (ref 101–111)
GFR calc non Af Amer: 54 mL/min — ABNORMAL LOW (ref >60)
Glucose, Bld: 94 mg/dL (ref 65–99)
Potassium: 4.9 mmol/L (ref 3.5–5.1)
SODIUM: 140 mmol/L (ref 135–145)

## 2016-08-13 MED ORDER — LIDOCAINE 5 % EX PTCH: 1.0000 | MEDICATED_PATCH | CUTANEOUS | 0 refills | Status: AC

## 2016-08-13 MED ORDER — METHOCARBAMOL 500 MG PO TABS: 500.0000 mg | ORAL_TABLET | Freq: Every day | ORAL | 0 refills | Status: AC

## 2016-08-13 MED ORDER — ACETAMINOPHEN 500 MG PO TABS
1000.0000 mg | ORAL_TABLET | Freq: Once | ORAL | Status: AC
  Administered 2016-08-13: 1000 mg via ORAL
  Filled 2016-08-13: qty 2

## 2016-08-13 MED ORDER — ACETAMINOPHEN 500 MG PO TABS: 1000.0000 mg | ORAL_TABLET | Freq: Three times a day (TID) | ORAL | 0 refills | Status: AC

## 2016-08-13 NOTE — ED Notes (Signed)
Patient transported to CT 

## 2016-08-13 NOTE — ED Triage Notes (Signed)
Pt. Coming from home via [facility] for altered mental status since Sunday. Pt. Wife states that Sunday he was disoriented x4. EMS reports AOX4 today. Similar incident last month and worried about dementia per wife. Pt. Noted to have healing abrasion to right elbow and bilateral shoulder soreness from fall Wednesday. EMS reports 12 lead showed LBBB.

## 2016-08-13 NOTE — ED Provider Notes (Signed)
[facility] DEPT Provider Note   CSN: [id number] Arrival date & time: 08/13/16  1113     History   Chief Complaint Chief Complaint  Patient presents with  . Altered Mental Status    HPI Austin Rose is a 83 y.o. male.  HPI History 33-year-old male presents with intermittent confusion. Patient has been seen here previously for the same ailment was thought to be related to narcotic use. Family reports that his primary care doctor to come off of his Percocets and placed him on tramadol for pain, which he been taking for the past 3 days. He did not take any last night because of his change in mental status. This morning when he awoke he was back to his baseline per the family. He denies any cardiac-related chest pain, shortness of breath, nausea, vomiting, fevers, chills, dysuria, abdominal pain.  In addition the report of mechanical fall off of his bed approximately one week ago, resulting in head trauma, right shoulder, right chest pain, upper back and lower back pain. Patient denies any bladder or bowel incontinence. No lower extremity weakness or saddle anesthesia. This was the reason that the patient was given the tramadol by the primary care doctor. Patient is not anticoagulated. Past Medical History:  Diagnosis Date  . Arthritis   . CAD (coronary artery disease)   . History of stress test 09/20/2008  . Hx of echocardiogram 07/20/2010   The cavity size was normal, there is mild aortic stenosis,the aortic root was normal is size, the ascending aorta was normal in size.  [name] Hx of varicella   . Hyperlipemia   . Hypertension   . Orthostatic hypotension    on mitodrine per [place]  . Squamous cell skin cancer    pinna     Patient Active Problem List   Diagnosis Date Noted  . Confusion 07/16/2016  . Cyanocobalamin deficiency 07/16/2016  . Coronary artery disease involving native coronary artery of native heart without angina pectoris   . Arterial hypotension   . History of  cerebral infarction 06/04/2016  . Hearing decreased, unspecified laterality 06/04/2016  . Restless legs 06/04/2016  . Multiple lacunar infarcts ([place]) 04/30/2016  . Calculus of gallbladder without cholecystitis without obstruction 04/30/2016  . Thrombocytopenia ([place]) 04/30/2016  . Expressive aphasia 04/18/2016  . CAD (coronary artery disease) of artery bypass graft 04/18/2016  . TIA (transient ischemic attack) 04/17/2016  . Senile ecchymosis 09/20/2014  . Hyperlipidemia 06/07/2014  . Primary osteoarthritis involving multiple joints 01/20/2014  . Coronary artery disease due to lipid rich plaque 01/20/2014  . Age factor 01/20/2014  . Medication side effect 01/20/2014  . Smokeless tobacco use within past 30 days 06/03/2013  . Skin abnormalities 05/18/2013  . Pain management 05/18/2013  . Abnormal TSH 05/18/2013  . Unspecified vitamin D deficiency 05/18/2013  . Viral URI with cough 11/04/2012  . Sleep difficulties 11/06/2011  . High risk medication use 04/10/2011  . Chronic orthostatic hypotension 05/16/2010  . Labyrinthitis 05/16/2010  . DIZZINESS 02/23/2009  . DEGENERATIVE JOINT DISEASE, SHOULDER 07/20/2008  . ARTHRITIS, KNEES, BILATERAL 07/20/2008  . FROZEN RIGHT SHOULDER 07/20/2008  . Hypothyroidism 11/11/2007  . Hereditary and idiopathic peripheral neuropathy 11/11/2007  . CONSTIPATION, CHRONIC 11/11/2007  . OSTEOPENIA 11/11/2007  . GASTRIC ULCER, HX OF 11/11/2007  . HYPERLIPIDEMIA 03/31/2007  . Essential hypertension 03/31/2007  . CORONARY ARTERY DISEASE 03/31/2007  . IBS 03/31/2007    Past Surgical History:  Procedure Laterality Date  . BACK SURGERY     15s1Repeat Foraminotomy  .  cataract surgery     [facility]   . CORONARY ARTERY BYPASS GRAFT  04/1999   Dr Roxan Hockey, showing 4 vessel disease and underwent multivessel CABG aththat time.  . LUMBAR DISC SURGERY     L4-5  decompression  . microdisscectomy     bilateral  . OTHER SURGICAL HISTORY     central  decompression       Home Medications    Prior to Admission medications   Medication Sig Start Date End Date Taking? Authorizing Provider  aspirin 325 MG tablet Take 1 tablet (325 mg total) by mouth daily. 04/20/16  Yes Theodis Blaze, MD  Cyanocobalamin (B-12 PO) Take 1 tablet by mouth 2 (two) times daily.    Yes Historical Provider, MD  midodrine (PROAMATINE) 5 MG tablet Take 5 mg by mouth 2 (two) times daily with a meal. 1 tab  8AM, 1 PM and 1 at 6 PM- Pt gets thru the [place]  08/16/10  Yes Historical Provider, MD  SIMVASTATIN PO Take 0.5 tablets by mouth daily.   Yes Historical Provider, MD  traZODone (DESYREL) 50 MG tablet Take 1 tablet (50 mg total) by mouth at bedtime as needed for sleep. 07/16/16  Yes Charlesetta Shanks, MD  acetaminophen (TYLENOL) 500 MG tablet Take 2 tablets (1,000 mg total) by mouth every 8 (eight) hours. Do not take more than 4000 mg of acetaminophen (Tylenol) in a 24-hour period. Please note that other medicines that you may be prescribed may have Tylenol as well. 08/13/16 08/23/16  Fatima Blank, MD  lidocaine (LIDODERM) 5 % Place 1 patch onto the skin daily. Remove & Discard patch within 12 hours or as directed by MD 08/13/16 09/12/16  Fatima Blank, MD  methocarbamol (ROBAXIN) 500 MG tablet Take 1 tablet (500 mg total) by mouth at bedtime. 08/13/16 08/20/16  Fatima Blank, MD    Family History Family History  Problem Relation Age of Onset  . Other Mother 58    brain aneursym  . Anuerysm Mother   . Heart attack Father     Social History Social History  Substance Use Topics  . Smoking status: Former Research scientist (life sciences)  . Smokeless tobacco: Current User    Types: Chew    Last attempt to quit: 04/15/1978  . Alcohol use No     Allergies   Fentanyl; Gabapentin; Norco [hydrocodone-acetaminophen]; Penicillins; and Tramadol   Review of Systems Review of Systems All other systems are reviewed and are negative for acute change except as noted in the  HPI   Physical Exam Updated Vital Signs BP (!) 144/89   Pulse (!) 51   Temp 98.1 F (36.7 C) (Oral)   Resp 14   Ht 5\' 9"  (1.753 m)   Wt 163 lb (73.9 kg)   SpO2 100%   BMI 24.07 kg/m   Physical Exam  Constitutional: He is oriented to person, place, and time. He appears well-developed and well-nourished. No distress.  HENT:  Head: Normocephalic.  Right Ear: External ear normal.  Left Ear: External ear normal.  Mouth/Throat: Oropharynx is clear and moist.  Eyes: Conjunctivae and EOM are normal. Pupils are equal, round, and reactive to light. Right eye exhibits no discharge. Left eye exhibits no discharge. No scleral icterus.  Neck: Normal range of motion. Neck supple.  Cardiovascular: Regular rhythm and normal heart sounds.  Exam reveals no gallop and no friction rub.   No murmur heard. Pulses:      Radial pulses are 2+ on the right  side, and 2+ on the left side.       Dorsalis pedis pulses are 2+ on the right side, and 2+ on the left side.  Pulmonary/Chest: Effort normal and breath sounds normal. No stridor. No respiratory distress. He exhibits tenderness.    Abdominal: Soft. He exhibits no distension. There is no tenderness.  Musculoskeletal:       Right shoulder: He exhibits tenderness.       Cervical back: He exhibits tenderness. He exhibits no bony tenderness.       Thoracic back: He exhibits no bony tenderness.       Lumbar back: He exhibits tenderness. He exhibits no bony tenderness.       Back:  Clavicle stable. Chest stable to AP/Lat compression. Pelvis stable to Lat compression. No obvious extremity deformity. No chest or abdominal wall contusion.  Neurological: He is alert and oriented to person, place, and time. GCS eye subscore is 4. GCS verbal subscore is 5. GCS motor subscore is 6.  Moving all extremities.  Spine Exam: Strength: 5/5 throughout LE bilaterally  Sensation: Intact to light touch in proximal and distal LE bilaterally Reflexes: 1+ quadriceps  and achilles reflexes     Skin: Skin is warm. He is not diaphoretic.     ED Treatments / Results  Labs (all labs ordered are listed, but only abnormal results are displayed) Labs Reviewed  CBC WITH DIFFERENTIAL/PLATELET - Abnormal; Notable for the following:       Result Value   RBC 4.19 (*)    Hemoglobin 12.3 (*)    HCT 38.7 (*)    All other components within normal limits  BASIC METABOLIC PANEL - Abnormal; Notable for the following:    GFR calc non Af Amer 54 (*)    All other components within normal limits    EKG  EKG Interpretation None       Radiology Dg Ribs Unilateral W/chest Right  Result Date: 08/13/2016 CLINICAL DATA:  Fall out of bed, right lateral rib pain EXAM: RIGHT RIBS AND CHEST - 3+ VIEW COMPARISON:  Chest radiographs dated 03/04/2006 FINDINGS: Lungs are clear.  No pleural effusion or pneumothorax. The heart is normal in size. Postsurgical changes related to prior CABG. Suspected nondisplaced right lateral 8th rib fracture. IMPRESSION: Suspected nondisplaced right lateral 8th rib fracture. No evidence of acute cardiopulmonary disease. Electronically Signed   By: Julian Hy M.D.   On: 08/13/2016 13:51   Dg Thoracic Spine 2 View  Result Date: 08/13/2016 CLINICAL DATA:  Fall from bed overnight, now with right-sided lateral rib pain and pain in lower back area. EXAM: THORACIC SPINE 2 VIEWS COMPARISON:  None. FINDINGS: No evidence of thoracic spine fracture. Degenerative spurring throughout the slightly kyphotic thoracic spine, mild to moderate in degree for age. Additional mild degenerative spurring noted within the lower cervical spine. Atherosclerotic changes noted at the aortic arch. Status post median sternotomy for presumed CABG. Visualized paravertebral soft tissues are otherwise unremarkable. IMPRESSION: No acute findings. Degenerative changes of the cervical and thoracic spine, mild to moderate in degree. Aortic atherosclerosis. Electronically Signed    By: Franki Cabot M.D.   On: 08/13/2016 13:48   Dg Lumbar Spine 2-3 Views  Result Date: 08/13/2016 CLINICAL DATA:  Fall from bed overnight, now with right sided rib pain and low back pain. EXAM: LUMBAR SPINE - 2-3 VIEW COMPARISON:  CT abdomen dated 07/17/2010. FINDINGS: Chronic appearing compression deformity of the L5 vertebral body is stable or perhaps slightly progressed compared to  the earlier CT. There are additional mild compression deformities involving the superior endplates of the K74 and T12 vertebral bodies, also stable or perhaps slightly progressed in the interval. No acute appearing fracture identified. Visualized paravertebral soft tissues are unremarkable for acute process. Atherosclerotic changes noted along the walls of the abdominal aorta. IMPRESSION: 1. Compression deformities of the T11, T12 and L5 vertebral bodies appear chronic and stable, or perhaps slightly progressed, compared to previous CT of 07/17/2010. 2. No acute appearing findings. 3. Aortic atherosclerosis. Electronically Signed   By: Franki Cabot M.D.   On: 08/13/2016 13:56   Dg Shoulder Right  Result Date: 08/13/2016 CLINICAL DATA:  Fall from bed overnight, right-sided lateral rib pain and low back pain. EXAM: RIGHT SHOULDER - 2+ VIEW COMPARISON:  None. FINDINGS: Superior subluxation of the right humeral head relative to the glenoid fossa, indicating overlying rotator cuff tear. No humeral head dislocation. No fracture line or displaced fracture fragment identified. IMPRESSION: 1. Superior subluxation of the right humeral head relative to the glenoid fossa indicating overlying rotator cuff tear. This is of uncertain age but suspected to be chronic. No frank dislocation. 2. No fracture seen. Electronically Signed   By: Franki Cabot M.D.   On: 08/13/2016 13:58   Ct Head Wo Contrast  Result Date: 08/13/2016 CLINICAL DATA:  Status post fall a few days ago.  Confusion. EXAM: CT HEAD WITHOUT CONTRAST TECHNIQUE: Contiguous  axial images were obtained from the base of the skull through the vertex without intravenous contrast. COMPARISON:  Brain MRI 04/18/2016.  Head CT scan 07/16/2016. FINDINGS: Brain: Atrophy and chronic microvascular ischemic change are again seen. No evidence of acute abnormality including hemorrhage, infarct, mass lesion, mass effect, midline shift or abnormal extra-axial fluid collection. No hydrocephalus or pneumocephalus. Vascular: Atherosclerotic vascular disease noted. Skull: Intact. Sinuses/Orbits: Negative. Other: None. IMPRESSION: No acute abnormality. Atrophy and chronic microvascular ischemic change. Atherosclerosis. Electronically Signed   By: Inge Rise M.D.   On: 08/13/2016 13:23    Procedures Procedures (including critical care time)  Medications Ordered in ED Medications  acetaminophen (TYLENOL) tablet 1,000 mg (1,000 mg Oral Given 08/13/16 1242)     Initial Impression / Assessment and Plan / ED Course  I have reviewed the triage vital signs and the nursing notes.  Pertinent labs & imaging results that were available during my care of the patient were reviewed by me and considered in my medical decision making (see chart for details).     1. Confusion Seems to be related to narcotic use. Patient is currently back to baseline. CT head without evidence of intracranial bleed.   2. Mechanical fall Resulting in nondisplaced right eighth rib fracture. Right shoulder without acute injuries however he did note some mild subluxation likely related to remote rotator cuff injury. No new thoracic or lumbar fractures. Did know stable compression fractures from prior. Patient without any evidence to suggest cauda equina.  Will treat pain with topical lidocaine and schedule Tylenol. We'll also provide daily at bedtime Robaxin. Family instructed to monitor patient's mental status if it changes while on the Robaxin they were instructed to discontinue it.  Final Clinical Impressions(s) /  ED Diagnoses   Final diagnoses:  Fall  Acute narcotic intoxication with delirium ([place])  Closed fracture of one rib of right side, initial encounter  Muscle strain   Disposition: Discharge  Condition: Good  I have discussed the results, Dx and Tx plan with the patient and familywho expressed understanding and agree(s) with the  plan. Discharge instructions discussed at great length. The patient and family was given strict return precautions who verbalized understanding of the instructions. No further questions at time of discharge.    New Prescriptions   ACETAMINOPHEN (TYLENOL) 500 MG TABLET    Take 2 tablets (1,000 mg total) by mouth every 8 (eight) hours. Do not take more than 4000 mg of acetaminophen (Tylenol) in a 24-hour period. Please note that other medicines that you may be prescribed may have Tylenol as well.   LIDOCAINE (LIDODERM) 5 %    Place 1 patch onto the skin daily. Remove & Discard patch within 12 hours or as directed by MD   METHOCARBAMOL (ROBAXIN) 500 MG TABLET    Take 1 tablet (500 mg total) by mouth at bedtime.    Follow Up: Burnis Medin, MD [place] [address] [phone]  Schedule an appointment as soon as possible for a visit  in 5-7 days      Fatima Blank, MD 08/13/16 1501

## 2016-08-13 NOTE — Telephone Encounter (Signed)
Patient's wife and daughter called in to office to report concerns with patient, patient has been confused, having trouble completing sentences, acting bizarre, removing clothes, having accidents with bowel and bladder, restless at night and sleeping during the day. Family reports these symptoms have been on and off for several weeks, but consistent since Friday. Patient had a fall x 2 days ago and now is having severe pain on left side which is making transfers difficult. Vitals have been stable when checked by home health, patient has had recent medication changes to include discontinuing norco approx 1.5/2 weeks ago after taking daily since 2012, patient did start ultram on Friday and trazadone on 08/12/16, patient still did not sleep despite taking trazadone. Advised family to take patient to ER for eval d/t severe pain associated fall and inability to transfer patient safely and for further eval of symptoms. Wife agreed and stated they are going to ER for eval. Reviewed symptoms and concerns with Dorothyann Peng, NP who agreed with disposition.

## 2016-08-14 DIAGNOSIS — E039 Hypothyroidism, unspecified: Secondary | ICD-10-CM | POA: Diagnosis not present

## 2016-08-14 DIAGNOSIS — M170 Bilateral primary osteoarthritis of knee: Secondary | ICD-10-CM | POA: Diagnosis not present

## 2016-08-14 DIAGNOSIS — E559 Vitamin D deficiency, unspecified: Secondary | ICD-10-CM | POA: Diagnosis not present

## 2016-08-14 DIAGNOSIS — I10 Essential (primary) hypertension: Secondary | ICD-10-CM | POA: Diagnosis not present

## 2016-08-14 DIAGNOSIS — I2510 Atherosclerotic heart disease of native coronary artery without angina pectoris: Secondary | ICD-10-CM | POA: Diagnosis not present

## 2016-08-14 DIAGNOSIS — G92 Toxic encephalopathy: Secondary | ICD-10-CM | POA: Diagnosis not present

## 2016-08-23 DIAGNOSIS — M170 Bilateral primary osteoarthritis of knee: Secondary | ICD-10-CM | POA: Diagnosis not present

## 2016-08-23 DIAGNOSIS — I2510 Atherosclerotic heart disease of native coronary artery without angina pectoris: Secondary | ICD-10-CM | POA: Diagnosis not present

## 2016-08-23 DIAGNOSIS — G92 Toxic encephalopathy: Secondary | ICD-10-CM | POA: Diagnosis not present

## 2016-08-23 DIAGNOSIS — E039 Hypothyroidism, unspecified: Secondary | ICD-10-CM | POA: Diagnosis not present

## 2016-08-23 DIAGNOSIS — I10 Essential (primary) hypertension: Secondary | ICD-10-CM | POA: Diagnosis not present

## 2016-08-23 DIAGNOSIS — E559 Vitamin D deficiency, unspecified: Secondary | ICD-10-CM | POA: Diagnosis not present

## 2016-09-05 DIAGNOSIS — Z0289 Encounter for other administrative examinations: Secondary | ICD-10-CM | POA: Diagnosis not present

## 2016-09-10 ENCOUNTER — Telehealth: Payer: Self-pay

## 2016-09-10 NOTE — Telephone Encounter (Signed)
Mr. Langlinais was scheduled for an AWV. He was in ER with confusion and fall and 8th rib fx.  Outreach to see how he is doing as he was advised to see an MD in 5 to 7 days. Was oriented on the phone. States he is doing well but wife in hospitalized currently. Asked why he had to come tomorrow.  Recommended he re-schedule when his wife was out of the hospital and I will see him with DR. Panosh as he has not seen her sine his ER admit/  Agreed to call back and reschedule and I will call back in 30 days

## 2016-09-11 ENCOUNTER — Ambulatory Visit: Payer: Medicare HMO

## 2016-11-11 ENCOUNTER — Encounter (HOSPITAL_COMMUNITY): Payer: Self-pay | Admitting: *Deleted

## 2016-11-11 ENCOUNTER — Telehealth: Payer: Self-pay | Admitting: Internal Medicine

## 2016-11-11 ENCOUNTER — Inpatient Hospital Stay (HOSPITAL_COMMUNITY)
Admission: EM | Admit: 2016-11-11 | Discharge: 2016-11-20 | DRG: 871 | Disposition: A | Discharge status: Skilled Nursing Facility | Payer: Medicare HMO | Attending: Family Medicine | Admitting: Family Medicine

## 2016-11-11 ENCOUNTER — Emergency Department (HOSPITAL_COMMUNITY): Payer: Medicare HMO

## 2016-11-11 DIAGNOSIS — N170 Acute kidney failure with tubular necrosis: Secondary | ICD-10-CM | POA: Diagnosis present

## 2016-11-11 DIAGNOSIS — I519 Heart disease, unspecified: Secondary | ICD-10-CM | POA: Diagnosis not present

## 2016-11-11 DIAGNOSIS — R32 Unspecified urinary incontinence: Secondary | ICD-10-CM | POA: Diagnosis present

## 2016-11-11 DIAGNOSIS — I248 Other forms of acute ischemic heart disease: Secondary | ICD-10-CM | POA: Diagnosis not present

## 2016-11-11 DIAGNOSIS — F17220 Nicotine dependence, chewing tobacco, uncomplicated: Secondary | ICD-10-CM | POA: Diagnosis present

## 2016-11-11 DIAGNOSIS — I951 Orthostatic hypotension: Secondary | ICD-10-CM | POA: Diagnosis present

## 2016-11-11 DIAGNOSIS — R41841 Cognitive communication deficit: Secondary | ICD-10-CM | POA: Diagnosis not present

## 2016-11-11 DIAGNOSIS — R404 Transient alteration of awareness: Secondary | ICD-10-CM | POA: Diagnosis not present

## 2016-11-11 DIAGNOSIS — E785 Hyperlipidemia, unspecified: Secondary | ICD-10-CM | POA: Diagnosis not present

## 2016-11-11 DIAGNOSIS — I10 Essential (primary) hypertension: Secondary | ICD-10-CM | POA: Diagnosis not present

## 2016-11-11 DIAGNOSIS — A419 Sepsis, unspecified organism: Secondary | ICD-10-CM | POA: Diagnosis not present

## 2016-11-11 DIAGNOSIS — Z888 Allergy status to other drugs, medicaments and biological substances status: Secondary | ICD-10-CM

## 2016-11-11 DIAGNOSIS — R4182 Altered mental status, unspecified: Secondary | ICD-10-CM | POA: Diagnosis not present

## 2016-11-11 DIAGNOSIS — I5021 Acute systolic (congestive) heart failure: Secondary | ICD-10-CM | POA: Diagnosis present

## 2016-11-11 DIAGNOSIS — G9341 Metabolic encephalopathy: Secondary | ICD-10-CM | POA: Diagnosis present

## 2016-11-11 DIAGNOSIS — I471 Supraventricular tachycardia: Secondary | ICD-10-CM | POA: Diagnosis present

## 2016-11-11 DIAGNOSIS — R6520 Severe sepsis without septic shock: Secondary | ICD-10-CM | POA: Diagnosis present

## 2016-11-11 DIAGNOSIS — R278 Other lack of coordination: Secondary | ICD-10-CM | POA: Diagnosis not present

## 2016-11-11 DIAGNOSIS — A4151 Sepsis due to Escherichia coli [E. coli]: Secondary | ICD-10-CM | POA: Diagnosis not present

## 2016-11-11 DIAGNOSIS — J181 Lobar pneumonia, unspecified organism: Secondary | ICD-10-CM | POA: Diagnosis not present

## 2016-11-11 DIAGNOSIS — J189 Pneumonia, unspecified organism: Secondary | ICD-10-CM | POA: Diagnosis not present

## 2016-11-11 DIAGNOSIS — R7881 Bacteremia: Secondary | ICD-10-CM | POA: Diagnosis not present

## 2016-11-11 DIAGNOSIS — E876 Hypokalemia: Secondary | ICD-10-CM | POA: Diagnosis not present

## 2016-11-11 DIAGNOSIS — R748 Abnormal levels of other serum enzymes: Secondary | ICD-10-CM | POA: Diagnosis not present

## 2016-11-11 DIAGNOSIS — Z880 Allergy status to penicillin: Secondary | ICD-10-CM

## 2016-11-11 DIAGNOSIS — N390 Urinary tract infection, site not specified: Secondary | ICD-10-CM | POA: Diagnosis not present

## 2016-11-11 DIAGNOSIS — R402411 Glasgow coma scale score 13-15, in the field [EMT or ambulance]: Secondary | ICD-10-CM | POA: Diagnosis not present

## 2016-11-11 DIAGNOSIS — I517 Cardiomegaly: Secondary | ICD-10-CM | POA: Diagnosis not present

## 2016-11-11 DIAGNOSIS — I340 Nonrheumatic mitral (valve) insufficiency: Secondary | ICD-10-CM | POA: Diagnosis not present

## 2016-11-11 DIAGNOSIS — R778 Other specified abnormalities of plasma proteins: Secondary | ICD-10-CM | POA: Diagnosis present

## 2016-11-11 DIAGNOSIS — I110 Hypertensive heart disease with heart failure: Secondary | ICD-10-CM | POA: Diagnosis present

## 2016-11-11 DIAGNOSIS — Q231 Congenital insufficiency of aortic valve: Secondary | ICD-10-CM

## 2016-11-11 DIAGNOSIS — I6789 Other cerebrovascular disease: Secondary | ICD-10-CM | POA: Diagnosis not present

## 2016-11-11 DIAGNOSIS — I4719 Other supraventricular tachycardia: Secondary | ICD-10-CM

## 2016-11-11 DIAGNOSIS — E872 Acidosis: Secondary | ICD-10-CM | POA: Diagnosis present

## 2016-11-11 DIAGNOSIS — M1991 Primary osteoarthritis, unspecified site: Secondary | ICD-10-CM | POA: Diagnosis present

## 2016-11-11 DIAGNOSIS — D693 Immune thrombocytopenic purpura: Secondary | ICD-10-CM | POA: Diagnosis present

## 2016-11-11 DIAGNOSIS — R05 Cough: Secondary | ICD-10-CM | POA: Diagnosis not present

## 2016-11-11 DIAGNOSIS — I272 Pulmonary hypertension, unspecified: Secondary | ICD-10-CM | POA: Diagnosis present

## 2016-11-11 DIAGNOSIS — G609 Hereditary and idiopathic neuropathy, unspecified: Secondary | ICD-10-CM | POA: Diagnosis present

## 2016-11-11 DIAGNOSIS — I350 Nonrheumatic aortic (valve) stenosis: Secondary | ICD-10-CM | POA: Diagnosis not present

## 2016-11-11 DIAGNOSIS — Z951 Presence of aortocoronary bypass graft: Secondary | ICD-10-CM

## 2016-11-11 DIAGNOSIS — N179 Acute kidney failure, unspecified: Secondary | ICD-10-CM | POA: Diagnosis present

## 2016-11-11 DIAGNOSIS — M1990 Unspecified osteoarthritis, unspecified site: Secondary | ICD-10-CM | POA: Diagnosis not present

## 2016-11-11 DIAGNOSIS — I5022 Chronic systolic (congestive) heart failure: Secondary | ICD-10-CM | POA: Diagnosis not present

## 2016-11-11 DIAGNOSIS — F4489 Other dissociative and conversion disorders: Secondary | ICD-10-CM | POA: Diagnosis not present

## 2016-11-11 DIAGNOSIS — I2510 Atherosclerotic heart disease of native coronary artery without angina pectoris: Secondary | ICD-10-CM | POA: Diagnosis not present

## 2016-11-11 DIAGNOSIS — E784 Other hyperlipidemia: Secondary | ICD-10-CM | POA: Diagnosis not present

## 2016-11-11 DIAGNOSIS — E870 Hyperosmolality and hypernatremia: Secondary | ICD-10-CM | POA: Diagnosis not present

## 2016-11-11 DIAGNOSIS — D696 Thrombocytopenia, unspecified: Secondary | ICD-10-CM | POA: Diagnosis present

## 2016-11-11 DIAGNOSIS — R1312 Dysphagia, oropharyngeal phase: Secondary | ICD-10-CM | POA: Diagnosis not present

## 2016-11-11 DIAGNOSIS — I4891 Unspecified atrial fibrillation: Secondary | ICD-10-CM | POA: Diagnosis present

## 2016-11-11 DIAGNOSIS — R7989 Other specified abnormal findings of blood chemistry: Secondary | ICD-10-CM | POA: Diagnosis present

## 2016-11-11 DIAGNOSIS — R410 Disorientation, unspecified: Secondary | ICD-10-CM | POA: Diagnosis not present

## 2016-11-11 DIAGNOSIS — R2689 Other abnormalities of gait and mobility: Secondary | ICD-10-CM | POA: Diagnosis not present

## 2016-11-11 DIAGNOSIS — Z79899 Other long term (current) drug therapy: Secondary | ICD-10-CM

## 2016-11-11 DIAGNOSIS — R059 Cough, unspecified: Secondary | ICD-10-CM

## 2016-11-11 DIAGNOSIS — M6281 Muscle weakness (generalized): Secondary | ICD-10-CM | POA: Diagnosis not present

## 2016-11-11 DIAGNOSIS — L8990 Pressure ulcer of unspecified site, unspecified stage: Secondary | ICD-10-CM | POA: Insufficient documentation

## 2016-11-11 DIAGNOSIS — R400 Somnolence: Secondary | ICD-10-CM | POA: Diagnosis present

## 2016-11-11 DIAGNOSIS — I447 Left bundle-branch block, unspecified: Secondary | ICD-10-CM | POA: Diagnosis present

## 2016-11-11 DIAGNOSIS — Z7982 Long term (current) use of aspirin: Secondary | ICD-10-CM

## 2016-11-11 HISTORY — DX: Metabolic encephalopathy: G93.41

## 2016-11-11 HISTORY — DX: Malignant (primary) neoplasm, unspecified: C80.1

## 2016-11-11 LAB — CBC WITH DIFFERENTIAL/PLATELET
Basophils Absolute: 0 10*3/uL (ref 0.0–0.1)
Basophils Relative: 0 %
EOS ABS: 0 10*3/uL (ref 0.0–0.7)
EOS PCT: 0 %
HCT: 35.3 % — ABNORMAL LOW (ref 39.0–52.0)
Hemoglobin: 11.8 g/dL — ABNORMAL LOW (ref 13.0–17.0)
LYMPHS ABS: 0.3 10*3/uL — AB (ref 0.7–4.0)
Lymphocytes Relative: 4 %
MCH: 29.6 pg (ref 26.0–34.0)
MCHC: 33.4 g/dL (ref 30.0–36.0)
MCV: 88.7 fL (ref 78.0–100.0)
MONOS PCT: 6 %
Monocytes Absolute: 0.5 10*3/uL (ref 0.1–1.0)
Neutro Abs: 7.2 10*3/uL (ref 1.7–7.7)
Neutrophils Relative %: 90 %
PLATELETS: 129 10*3/uL — AB (ref 150–400)
RBC: 3.98 MIL/uL — ABNORMAL LOW (ref 4.22–5.81)
RDW: 14.6 % (ref 11.5–15.5)
WBC: 8 10*3/uL (ref 4.0–10.5)

## 2016-11-11 LAB — TROPONIN I
TROPONIN I: 0.25 ng/mL — AB (ref <0.03)
Troponin I: 0.09 ng/mL (ref <0.03)

## 2016-11-11 LAB — I-STAT CG4 LACTIC ACID, ED
Lactic Acid, Venous: 2.19 mmol/L (ref 0.5–1.9)
Lactic Acid, Venous: 1.84 mmol/L (ref 0.5–1.9)

## 2016-11-11 LAB — COMPREHENSIVE METABOLIC PANEL
ALK PHOS: 75 U/L (ref 38–126)
ALT: 15 U/L — ABNORMAL LOW (ref 17–63)
ANION GAP: 12 (ref 5–15)
AST: 31 U/L (ref 15–41)
Albumin: 2.9 g/dL — ABNORMAL LOW (ref 3.5–5.0)
BUN: 50 mg/dL — ABNORMAL HIGH (ref 6–20)
CALCIUM: 9 mg/dL (ref 8.9–10.3)
CHLORIDE: 104 mmol/L (ref 101–111)
CO2: 20 mmol/L — AB (ref 22–32)
Creatinine, Ser: 2.44 mg/dL — ABNORMAL HIGH (ref 0.61–1.24)
GFR, EST AFRICAN AMERICAN: 25 mL/min — AB (ref >60)
GFR, EST NON AFRICAN AMERICAN: 22 mL/min — AB (ref >60)
Glucose, Bld: 114 mg/dL — ABNORMAL HIGH (ref 65–99)
Potassium: 4.1 mmol/L (ref 3.5–5.1)
SODIUM: 136 mmol/L (ref 135–145)
Total Bilirubin: 1 mg/dL (ref 0.3–1.2)
Total Protein: 6.1 g/dL — ABNORMAL LOW (ref 6.5–8.1)

## 2016-11-11 LAB — LACTIC ACID, PLASMA
LACTIC ACID, VENOUS: 1.8 mmol/L (ref 0.5–1.9)
LACTIC ACID, VENOUS: 5.1 mmol/L — AB (ref 0.5–1.9)

## 2016-11-11 LAB — URINALYSIS, ROUTINE W REFLEX MICROSCOPIC
Bilirubin Urine: NEGATIVE
Glucose, UA: NEGATIVE mg/dL
Ketones, ur: NEGATIVE mg/dL
Nitrite: POSITIVE — AB
PH: 5 (ref 5.0–8.0)
PROTEIN: 100 mg/dL — AB
Specific Gravity, Urine: 1.015 (ref 1.005–1.030)
Squamous Epithelial / LPF: NONE SEEN

## 2016-11-11 LAB — PROCALCITONIN: Procalcitonin: 3.5 ng/mL

## 2016-11-11 MED ORDER — LEVOFLOXACIN IN D5W 500 MG/100ML IV SOLN: 500.0000 mg | INTRAVENOUS | Status: DC

## 2016-11-11 MED ORDER — VANCOMYCIN HCL IN DEXTROSE 1-5 GM/200ML-% IV SOLN
1000.0000 mg | INTRAVENOUS | Status: DC
  Filled 2016-11-11: qty 200

## 2016-11-11 MED ORDER — SODIUM CHLORIDE 0.9 % IV SOLN
INTRAVENOUS | Status: DC
  Administered 2016-11-11 – 2016-11-13 (×4): via INTRAVENOUS

## 2016-11-11 MED ORDER — ACETAMINOPHEN 650 MG RE SUPP
650.0000 mg | Freq: Four times a day (QID) | RECTAL | Status: DC | PRN
  Administered 2016-11-11: 650 mg via RECTAL
  Filled 2016-11-11: qty 1

## 2016-11-11 MED ORDER — SODIUM CHLORIDE 0.9 % IV BOLUS (SEPSIS)
1000.0000 mL | Freq: Once | INTRAVENOUS | Status: AC
  Administered 2016-11-11: 1000 mL via INTRAVENOUS

## 2016-11-11 MED ORDER — AZTREONAM 2 G IJ SOLR
2.0000 g | Freq: Once | INTRAMUSCULAR | Status: AC
  Administered 2016-11-11: 2 g via INTRAVENOUS
  Filled 2016-11-11: qty 2

## 2016-11-11 MED ORDER — LORAZEPAM 2 MG/ML IJ SOLN
1.0000 mg | Freq: Once | INTRAMUSCULAR | Status: AC
  Administered 2016-11-11: 1 mg via INTRAVENOUS

## 2016-11-11 MED ORDER — HALOPERIDOL LACTATE 5 MG/ML IJ SOLN
5.0000 mg | Freq: Once | INTRAMUSCULAR | Status: AC
  Administered 2016-11-11: 5 mg via INTRAMUSCULAR
  Filled 2016-11-11: qty 1

## 2016-11-11 MED ORDER — SODIUM CHLORIDE 0.9 % IV BOLUS (SEPSIS)
250.0000 mL | Freq: Once | INTRAVENOUS | Status: AC
  Administered 2016-11-11: 250 mL via INTRAVENOUS

## 2016-11-11 MED ORDER — ACETAMINOPHEN 325 MG PO TABS: 650.0000 mg | ORAL_TABLET | Freq: Four times a day (QID) | ORAL | Status: DC | PRN

## 2016-11-11 MED ORDER — SODIUM CHLORIDE 0.9 % IV BOLUS (SEPSIS)
500.0000 mL | INTRAVENOUS | Status: AC
  Administered 2016-11-11: 500 mL via INTRAVENOUS

## 2016-11-11 MED ORDER — VANCOMYCIN HCL IN DEXTROSE 1-5 GM/200ML-% IV SOLN
1000.0000 mg | Freq: Once | INTRAVENOUS | Status: AC
  Administered 2016-11-11: 1000 mg via INTRAVENOUS
  Filled 2016-11-11: qty 200

## 2016-11-11 MED ORDER — AMIODARONE HCL IN DEXTROSE 360-4.14 MG/200ML-% IV SOLN
60.0000 mg/h | Freq: Once | INTRAVENOUS | Status: AC
  Administered 2016-11-11: 60 mg/h via INTRAVENOUS
  Filled 2016-11-11: qty 200

## 2016-11-11 MED ORDER — LORAZEPAM 2 MG/ML IJ SOLN
INTRAMUSCULAR | Status: AC
  Administered 2016-11-11: 1 mg via INTRAVENOUS
  Filled 2016-11-11: qty 1

## 2016-11-11 MED ORDER — LEVOFLOXACIN IN D5W 750 MG/150ML IV SOLN
750.0000 mg | Freq: Once | INTRAVENOUS | Status: AC
  Administered 2016-11-11: 750 mg via INTRAVENOUS
  Filled 2016-11-11: qty 150

## 2016-11-11 MED ORDER — AMIODARONE IV BOLUS ONLY 150 MG/100ML
150.0000 mg | Freq: Once | INTRAVENOUS | Status: AC
  Administered 2016-11-11: 150 mg via INTRAVENOUS
  Filled 2016-11-11: qty 100

## 2016-11-11 MED ORDER — AMIODARONE LOAD VIA INFUSION: 150.0000 mg | Freq: Once | INTRAVENOUS | Status: DC

## 2016-11-11 MED ORDER — AZTREONAM 1 G IJ SOLR
1.0000 g | Freq: Three times a day (TID) | INTRAMUSCULAR | Status: DC
  Administered 2016-11-11 – 2016-11-13 (×5): 1 g via INTRAVENOUS
  Filled 2016-11-11 (×7): qty 1

## 2016-11-11 MED ORDER — LORAZEPAM 2 MG/ML IJ SOLN
1.0000 mg | Freq: Once | INTRAMUSCULAR | Status: AC
  Administered 2016-11-11: 1 mg via INTRAVENOUS
  Filled 2016-11-11: qty 1

## 2016-11-11 NOTE — ED Triage Notes (Signed)
Per ems patient started running a fever on Friday is normally alert oriented , started getting confused on sat and is getting worse. Upon arrival to ed confused pulling at everything. Not speaking any words. Moves all extremites x 4. Very saturated depends removed and patient was cleaned. Up.

## 2016-11-11 NOTE — H&P (Signed)
History and Physical    JAYMARION TROMBLY ZOX:[id number] DOB: 02-03-1926 DOA: 11/11/2016   PCP: Burnis Medin, MD   Attending physician: Marily Memos  Patient coming from/Resides with: Private residence/wife  Chief Complaint: Fever, altered mental status  HPI: Austin Rose is a 83 y.o. male with medical history significant for orthostatic hypotension on [facility],  aortic stenosis with mild pulmonary hypertension, remote CAD/CABG 17 years prior, arthritis and dyslipidemia. Patient sent to the ER after developing progressive confusion (did not recognize family members) in the setting of persistent fevers that began this past Friday. By this morning he was not speaking although he was moving all extremities 4. He was also incontinent of a large amount of urine. Family reports that urine is dark colored and very strong smelling. In the ER chest x-ray was unremarkable, rectal temperature was 811.9, systolic blood pressure in the 90-110 range, room air saturations were 97%. He was found to have acute kidney injury with mild metabolic acidosis, lactic acid was elevated at 2.19, troponin mildly elevated at 0.09 with subtle ST segment changes in leads V2 through V5. He was also found to have mild thrombocytopenia. Urinalysis and culture pending. CT head unremarkable. Patient met sepsis criteria and therefore given suboptimal blood pressures and elevated lactic acid EDP opted to give patient 30 mL/kg bolus saline and has requested hospitalist team admit patient.  ED Course:  Vital Signs: BP 97/62   Pulse 93   Temp (!) 101.5 F (38.6 C) (Rectal)   Resp (!) 24   Wt 77.1 kg (170 lb)   SpO2 100%   BMI 25.10 kg/m  PCXR: Negative CT without contrast: No acute intracranial process Lab data: Sodium 136, potassium 4.1, chloride 14, CO2 20, glucose 114, BUN 50, creatinine 2.44, albumin 2.9, anion gap 12, troponin 0.09, lactic acid 2.19, white count 8000 with neutrophils 90% and  absolute atrial 7.2%, hemoglobin 9.8, platelets 129,000; blood cultures obtained in the ER; urinalysis and urine culture collected and results pending Medications and treatments: Normal saline bolus 2250 mL, Levaquin 750 mg IV 1, Azactam 2 g IV 1, vancomycin 1 g IV 1  Review of Systems:  In addition to the HPI above,  No Headache, changes with Vision or hearing, new focal weakness, tingling, numbness in any extremity, dizziness, tremors or seizure activity No indigestion/reflux, choking or coughing while eating, abdominal pain with or after eating No Chest pain, Cough or Shortness of Breath, palpitations, orthopnea or DOE No Abdominal pain, N/V, melena,hematochezia, dark tarry stools, constipation No dysuria, hematuria or flank pain No new skin rashes, lesions, masses or bruises, No new joint pains, aches, swelling or redness No recent unintentional weight gain or loss No polyuria, polydypsia or polyphagia   Past Medical History:  Diagnosis Date  . Arthritis   . CAD (coronary artery disease)   . Cancer ([place])   . History of stress test 09/20/2008  . Hx of echocardiogram 07/20/2010   The cavity size was normal, there is mild aortic stenosis,the aortic root was normal is size, the ascending aorta was normal in size.  [name] Hx of varicella   . Hyperlipemia   . Hypertension   . Orthostatic hypotension    [facility]    Past Surgical History:  Procedure Laterality Date  . BACK SURGERY     15s1Repeat Foraminotomy  . cataract surgery     digby   . CORONARY ARTERY BYPASS GRAFT  04/1999   Dr Roxan Hockey, showing  4 vessel disease and underwent multivessel CABG aththat time.  . LUMBAR DISC SURGERY     L4-5  decompression  . microdisscectomy     bilateral  . OTHER SURGICAL HISTORY     central decompression    Social History   Social History  . Marital status: Married    Spouse name: [name]  . Number of children: [name]  . Years of education: [name]   Occupational History  .  Not on file.   Social History Main Topics  . Smoking status: Former Research scientist (life sciences)  . Smokeless tobacco: Current User    Types: Chew    Last attempt to quit: 04/15/1978  . Alcohol use No  . Drug use: No  . Sexual activity: No   Other Topics Concern  . 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   2 use tobacco no significant alcohol   No ets   Has fa                Mobility: Has been advised to utilize a cane for mobility but typically will not utilize although will use a "walking stick" when outside Work history: Not obtained   Allergies  Allergen Reactions  . Fentanyl     Confusion   . Gabapentin Other (See Comments)    Behavior changes noted by family  Better when stopped  2015  . Norco [Hydrocodone-Acetaminophen]     Confusion   . Penicillins Other (See Comments)    unknown  . Tramadol     confusion    Family History  Problem Relation Age of Onset  . Other Mother 104       brain aneursym  . Anuerysm Mother   . Heart attack Father      Prior to Admission medications   Medication Sig Start Date End Date Taking? Authorizing Provider  gabapentin (NEURONTIN) 300 MG capsule Take 300 mg by mouth daily.   Yes [provider]  aspirin 325 MG tablet Take 1 tablet (325 mg total) by mouth daily. 04/20/16   Theodis Blaze, MD  Cyanocobalamin (B-12 PO) Take 1 tablet by mouth 2 (two) times daily.     [provider]  midodrine (PROAMATINE) 5 MG tablet Take 5 mg by mouth 3 (three) times daily with meals. 1 tab  8AM, 1 PM and 1 at 6 PM- Pt gets thru the [facility]  08/16/10   [provider]  simvastatin (ZOCOR) 20 MG tablet Take 0.5 tablets by mouth daily.    [provider]  traZODone (DESYREL) 50 MG tablet Take 1 tablet (50 mg total) by mouth at bedtime as needed for sleep. 07/16/16   Charlesetta Shanks, MD     Physical Exam: Vitals:   11/11/16 1418 11/11/16 1455 11/11/16 1500 11/11/16 1615  BP:  (!) 1_0  Pulse:  88 90 93  Resp:  18 (!) 24   Temp: (!) 101.5 F (38.6 C)     TempSrc: Rectal     SpO2:  95% 97% 100%  Weight:   77.1 kg (170 lb)       Constitutional: Sleepy, face is flushed Eyes: PERRL, lids and conjunctivae normal; sclera injected ENMT: Mucous membranes are extremely dry Posterior pharynx clear of any exudate or lesions.  edentulous.  Neck: normal, supple, no masses, no thyromegaly Respiratory: clear to auscultation bilaterally, no wheezing, no crackles. Normal respiratory effort. No accessory muscle use.  Cardiovascular: Regular rate and rhythm, no murmurs / rubs / gallops. No extremity edema. 2+ pedal pulses. No carotid bruits. Lower extremities cool to touch with less than 2 second capillary refill. Abdomen: no tenderness, no masses palpated. No hepatosplenomegaly. Bowel sounds positive.  Musculoskeletal: no clubbing / cyanosis. No joint deformity upper and lower extremities. Good ROM, no contractures. Normal muscle tone.  Skin: no rashes, lesions, ulcers. No induration Neurologic: CN 2-12 grossly intact although exam somewhat limited by patient ability to participate. Sensation appears to be grossly intact, DTR normal. Unable to adequately test strength given patient's inability to consistently participate with exam Psychiatric: Lethargic and drifts off to sleep easily. Will awaken to tactile stimulation and loud voice and attempt to verbally communicate. Speech is clear   Labs on Admission: I have personally reviewed following labs and imaging studies  CBC:  Recent Labs Lab 11/11/16 1333  WBC 8.0  NEUTROABS 7.2  HGB 11.8*  HCT 35.3*  MCV 88.7  PLT 010*   Basic Metabolic Panel:  Recent Labs Lab 11/11/16 1333  NA 136  K 4.1  CL 104  CO2 20*  GLUCOSE 114*  BUN 50*  CREATININE 2.44*  CALCIUM 9.0   GFR: Estimated Creatinine Clearance:  20.5 mL/min (A) (by C-G formula based on SCr of 2.44 mg/dL (H)). Liver Function Tests:  Recent Labs Lab 11/11/16 1333  AST 31  ALT 15*  ALKPHOS 75  BILITOT 1.0  PROT 6.1*  ALBUMIN 2.9*   No results for input(s): LIPASE, AMYLASE in the last 168 hours. No results for input(s): AMMONIA in the last 168 hours. Coagulation Profile: No results for input(s): INR, PROTIME in the last 168 hours. Cardiac Enzymes:  Recent Labs Lab 11/11/16 1333  TROPONINI 0.09*   BNP (last 3 results) No results for input(s): PROBNP in the last 8760 hours. HbA1C: No results for input(s): HGBA1C in the last 72 hours. CBG: No results for input(s): GLUCAP in the last 168 hours. Lipid Profile: No results for input(s): CHOL, HDL, LDLCALC, TRIG, CHOLHDL, LDLDIRECT in the last 72 hours. Thyroid Function Tests: No results for input(s): TSH, T4TOTAL, FREET4, T3FREE, THYROIDAB in the last 72 hours. Anemia Panel: No results for input(s): VITAMINB12, FOLATE, FERRITIN, TIBC, IRON, RETICCTPCT in the last 72 hours. Urine analysis:    Component Value Date/Time   COLORURINE YELLOW 07/16/2016 1102   APPEARANCEUR CLEAR 07/16/2016 1102   LABSPEC 1.010 07/16/2016 1102   PHURINE 7.0 07/16/2016 1102   GLUCOSEU NEGATIVE 07/16/2016 1102   HGBUR NEGATIVE 07/16/2016 1102   HGBUR negative 11/17/2009 1342   BILIRUBINUR NEGATIVE 07/16/2016 1102   BILIRUBINUR n 05/15/2015 1445   KETONESUR NEGATIVE 07/16/2016 1102   PROTEINUR NEGATIVE 07/16/2016 1102   UROBILINOGEN 1.0 05/15/2015 1445   UROBILINOGEN 0.2 07/17/2010 1338   NITRITE NEGATIVE 07/16/2016 1102   LEUKOCYTESUR NEGATIVE 07/16/2016 1102   Sepsis Labs: _0 (procalcitonin:4,lacticidven:4) )No results found for this or any previous visit (from the past 240 hour(s)).   Radiological Exams on Admission: Ct Head Wo Contrast  Result Date: 11/11/2016 CLINICAL DATA:  Altered level of consciousness.  Confusion EXAM: CT HEAD WITHOUT CONTRAST TECHNIQUE: Contiguous  axial images were obtained from the base of the skull through the vertex without intravenous contrast. COMPARISON:  CT head 08/13/2016 FINDINGS: Brain: Moderate atrophy. Chronic microvascular ischemic change in the white matter. Negative for acute infarct, hemorrhage,  or mass lesion. No shift of the midline structures. Vascular: Negative for hyperdense vessel. Skull: Negative Sinuses/Orbits: Negative Other: None IMPRESSION: Atrophy and chronic microvascular ischemia.  No acute abnormality. Electronically Signed   By: Franchot Gallo M.D.   On: 11/11/2016 15:26   Dg Chest Portable 1 View  Result Date: 11/11/2016 CLINICAL DATA:  Altered level of consciousness, patient undergoing sepsis evaluation. History of coronary artery disease, hypertension, former smoker. EXAM: PORTABLE CHEST 1 VIEW COMPARISON:  AP chest x-ray of Aug 13, 2016. FINDINGS: The lungs are adequately inflated and clear. The heart is top-normal in size. The pulmonary vascularity is normal. There is tortuosity of the ascending and descending thoracic aorta with mural calcification. The patient has undergone previous CABG. The observed bony thorax exhibits no acute abnormality. IMPRESSION: There is no acute cardiopulmonary abnormality.  Previous CABG. Thoracic aortic atherosclerosis. Electronically Signed   By: David  Martinique M.D.   On: 11/11/2016 14:50    EKG: (Independently reviewed) sinus rhythm with PACs and PVCs, ventricular rate 85 bpm, QTC 4 and 36 ms, subtle ST segment depression/downsloping in leads V2 through V6 which is new compared to previous EKG  Assessment/Plan Principal Problem:   Sepsis -Patient presents with a three-day history of fevers with the following sepsis criteria: Tachycardia, mild tachypnea, altered mental status, acute kidney injury, elevated lactic acid -Source unclear; chest x-ray unremarkable  -Urinalysis/urine culture pending -Has been given fluid challenges as documented above-continue IV fluids at 100/hr  noting patient has chronic orthostatic hypotension and inconsistent use of prescribed midodrine documented by PCP -Follow-up on blood cultures -Obtain Procalcitonin -Continue to trend lactic acid -Broad spectrum empiric antibiotics/penicillin allergic therefore will use Levaquin/Azactam/vancomycin -Respiratory viral panel; repeat PCXR in a.m. after hydration -Daughter reports that patient works out in the garden regularly and when patient developed fever and altered mental status they checked him for ticks and did not find any  Active Problems:   Acute kidney injury  -Baseline: 12/1.15 -Current: 50/2.44 -Continue volume resuscitation as above -Follow labs    Elevated troponin/CAD -No reports of chest pain prior to onset of patient's fevers -Suspect mildly elevated troponin related to demand ischemia from sepsis physiology -Does have subtle ST segment changes on EKG-EDP has consulted cardiology -Continue telemetry -Cycle troponin -Blood pressure suboptimal and no chest pain so no indication for nitrates -We'll hold home aspirin for now (see below)    Acute metabolic encephalopathy -Secondary to sepsis physiology -NPO until more alert -Mobilize with assistance once ready to get OOB    Thrombocytopenia, idiopathic  -Current platelets greater than 100,000 -Possibly related to gram-negative infection -Follow labs    Orthostatic hypotension -[facility] -On midodrine at home-currently on hold secondary to altered mentation/NPO -Possibly influence the patient's underlying aortic stenosis    Aortic stenosis, moderate w/ mild pulmonary HTN and left ventricular diastolic dysfunction -Documented on echocardiogram from January 2018 -Ensure adequate hydration and if dehydrated must be assisted with position changes and ambulation    Arthritis -History of altered mentation secondary to narcotic pain medications in the past 12 months -Has associated neuropathy  therefore on Neurontin at home -Home trazodone on hold secondary to altered mentation    Hyperlipemia -Currently holding home Zocor secondary to altered mentation      DVT prophylaxis: Dose adjusted Lovenox Code Status: Full (per daughter) Family Communication: Daughter Disposition Plan: Home Consults called: Cardiology/[facility] on-call MD    Samella Parr ANP-BC [facility] Pager [phone]   If 7PM-7AM, please contact  night-coverage [email or web address] Password [email or web address]  11/11/2016, 4:52 PM

## 2016-11-11 NOTE — ED Notes (Signed)
Patient becoming more agitated, pulling at wired. MD notified, to place order for 5mg  Haldol.

## 2016-11-11 NOTE — ED Notes (Signed)
MD states patient still SDU appropriate. PA-C to put in amiodarone drip.

## 2016-11-11 NOTE — ED Notes (Signed)
Patient placed on Zoll pads 

## 2016-11-11 NOTE — Telephone Encounter (Signed)
Dr. Regis Bill  Please Advise-  Pt's wife called in stating pt is still having some confusion. She states in addition to the confusion, pt could possible have a uti or kidney infection. She states the past two days pt has a fever but no fever today, his urine has a strong odor but no burning sensation. Pt is currently taking Azo which he started Friday. Pt denies back pain nor described blood in urine. She wanted to know should she take him to the ER for further evaluation and she is concerned that the naproxen is causing his confusion.

## 2016-11-11 NOTE — ED Notes (Signed)
Sons are at the bedside states patient  Has been running a fever off and on since Friday am, states he was taken to an urgent care on sat however wasn't seen because he didn't have an ID on him. Patient started getting confused on sat however today started getting combative disoriented. Doesn't even recognized his sons. incont of urine for 3 days. Was seen before in May for similar sx and was told it was from [facility]. Use. Of which they removed all narc at that time.

## 2016-11-11 NOTE — Consult Note (Addendum)
Cardiology Consultation:   Patient ID: Austin Rose; [id number]; 12/08/1925   Admit date: 11/11/2016 Date of Consult: 11/11/2016  Primary Care Provider: Burnis Medin, MD Primary Cardiologist: Dr. Claiborne Billings Primary Electrophysiologist:  none   Patient Profile:   Austin Rose is a 83 y.o. male with a hx of CAD s/p remote CABGwho is being seen today for the evaluation of elevated troponin at the request of Dr. Marily Memos.  History of Present Illness:   Austin Rose is an 83yo male with a remote history of ASCAD with remote CABG with LIMA to LAD, SVG to OM1 and SVG to PDA, bicuspid AV with moderate AS by echo 04/2016, HTN, hyperlipidemia and orthostatic hypotension on Midodrine who presented to the ER with MS changes. Patient is extremely confused with delirium and cannot get any history from him.  History if obtained from the daughter. Up until the past few days he has been fine with no complaints of chest pain or pressure, SOB, DOE or palpitations.  He has had progressive confusion to the point of not recognizing family members for the past few days. He has been having fevers since Friday.  This am he was not speaking and was incontinent of large amounts of urine that was dark and foul smelling.  In ER he was febrile to 101.5 with SBP 90-175mmHg.  O2 sats 97% on RA.  Head CT benign.  Found to be in AKI with creatinine 2.44 and lactic acid of 2.19.  UA confirmed UTI.  Noted to have trop of 0.09 and Cardiology is asked to see.    Past Medical History:  Diagnosis Date  . Arthritis   . CAD (coronary artery disease)   . Cancer ([place])   . History of stress test 09/20/2008  . Hx of echocardiogram 07/20/2010   The cavity size was normal, there is mild aortic stenosis,the aortic root was normal is size, the ascending aorta was normal in size.  [name] Hx of varicella   . Hyperlipemia   . Hypertension   . Orthostatic hypotension    [facility]    Past Surgical History:  Procedure Laterality  Date  . BACK SURGERY     15s1Repeat Foraminotomy  . cataract surgery     digby   . CORONARY ARTERY BYPASS GRAFT  04/1999   Dr Roxan Hockey, showing 4 vessel disease and underwent multivessel CABG aththat time.  . LUMBAR DISC SURGERY     L4-5  decompression  . microdisscectomy     bilateral  . OTHER SURGICAL HISTORY     central decompression     Inpatient Medications: Scheduled Meds:  Continuous Infusions: . sodium chloride 100 mL/hr at 11/11/16 1730  . aztreonam    . [START ON 11/13/2016] levofloxacin (LEVAQUIN) IV    . [START ON 11/13/2016] vancomycin     PRN Meds: acetaminophen **OR** acetaminophen  Allergies:    Allergies  Allergen Reactions  . Fentanyl     Confusion   . Gabapentin Other (See Comments)    Behavior changes noted by family  Better when stopped  2015  . Norco [Hydrocodone-Acetaminophen]     Confusion   . Penicillins Other (See Comments)    unknown  . Tramadol     confusion    Social History:   Social History   Social History  . Marital status: Married    Spouse name: [name]  . Number of children: [name]  . Years of education: [name]   Occupational History  .  Not on file.   Social History Main Topics  . Smoking status: Former Research scientist (life sciences)  . Smokeless tobacco: Current User    Types: Chew    Last attempt to quit: 04/15/1978  . Alcohol use No  . Drug use: No  . Sexual activity: No   Other Topics Concern  . 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   2 use tobacco no significant alcohol   No ets   Has fa                Family History:   The patient's family history includes Anuerysm in his mother; Heart attack in his father; Other (age of onset: 35) in his mother.  ROS:  Please see the history of present illness.  ROS  All other ROS reviewed and negative.     Physical Exam/Data:   Vitals:    11/11/16 1630 11/11/16 1700 11/11/16 1715 11/11/16 1745  BP: 112/71 102/72 (!) 95/56 106/84  Pulse: 88 85 94   Resp: (!) 22 (!) 24 (!) 22 (!) 25  Temp:      TempSrc:      SpO2: 99% 99% 99%   Weight:        Intake/Output Summary (Last 24 hours) at 11/11/16 1823 Last data filed at 11/11/16 1725  Gross per 24 hour  Intake             2800 ml  Output                0 ml  Net             2800 ml   Filed Weights   11/11/16 1500  Weight: 170 lb (77.1 kg)   Body mass index is 25.1 kg/m.  General: thin, frail appearing and very confused HEENT: mouth and tongue extremely dry Lymph: no adenopathy Neck: no JVD Endocrine:  No thryomegaly Vascular: No carotid bruits; FA pulses 2+ bilaterally without bruits  Cardiac:  normal S1, S2; RRR; no murmur  Lungs:  clear to auscultation bilaterally, no wheezing, rhonchi or rales  Abd: soft, nontender, no hepatomegaly  Ext: no edema Musculoskeletal:  No deformities, BUE and BLE strength normal and equal Skin: warm and dry  Neuro:  CNs 2-12 intact, no focal abnormalities noted Psych:  Normal affect   EKG:  The EKG was personally reviewed and demonstrates:  NSR with PACs and PVCs and LBBB Telemetry:  Telemetry was personally reviewed and demonstrates:  NSR with frequent PACs, PVCs and nonsustained atrial tachycardia with LBBB  Relevant CV Studies: none  Laboratory Data:  Chemistry Recent Labs Lab 11/11/16 1333  NA 136  K 4.1  CL 104  CO2 20*  GLUCOSE 114*  BUN 50*  CREATININE 2.44*  CALCIUM 9.0  GFRNONAA 22*  GFRAA 25*  ANIONGAP 12     Recent Labs Lab 11/11/16 1333  PROT 6.1*  ALBUMIN 2.9*  AST 31  ALT 15*  ALKPHOS 75  BILITOT 1.0   Hematology Recent Labs Lab 11/11/16 1333  WBC 8.0  RBC 3.98*  HGB 11.8*  HCT 35.3*  MCV 88.7  MCH 29.6  MCHC 33.4  RDW 14.6  PLT 129*   Cardiac Enzymes Recent Labs Lab 11/11/16 1333  TROPONINI 0.09*   No results  for input(s): TROPIPOC in the last 168 hours.  BNPNo results  for input(s): BNP, PROBNP in the last 168 hours.  DDimer No results for input(s): DDIMER in the last 168 hours.  Radiology/Studies:  Ct Head Wo Contrast  Result Date: 11/11/2016 CLINICAL DATA:  Altered level of consciousness.  Confusion EXAM: CT HEAD WITHOUT CONTRAST TECHNIQUE: Contiguous axial images were obtained from the base of the skull through the vertex without intravenous contrast. COMPARISON:  CT head 08/13/2016 FINDINGS: Brain: Moderate atrophy. Chronic microvascular ischemic change in the white matter. Negative for acute infarct, hemorrhage, or mass lesion. No shift of the midline structures. Vascular: Negative for hyperdense vessel. Skull: Negative Sinuses/Orbits: Negative Other: None IMPRESSION: Atrophy and chronic microvascular ischemia.  No acute abnormality. Electronically Signed   By: Franchot Gallo M.D.   On: 11/11/2016 15:26   Dg Chest Portable 1 View  Result Date: 11/11/2016 CLINICAL DATA:  Altered level of consciousness, patient undergoing sepsis evaluation. History of coronary artery disease, hypertension, former smoker. EXAM: PORTABLE CHEST 1 VIEW COMPARISON:  AP chest x-ray of Aug 13, 2016. FINDINGS: The lungs are adequately inflated and clear. The heart is top-normal in size. The pulmonary vascularity is normal. There is tortuosity of the ascending and descending thoracic aorta with mural calcification. The patient has undergone previous CABG. The observed bony thorax exhibits no acute abnormality. IMPRESSION: There is no acute cardiopulmonary abnormality.  Previous CABG. Thoracic aortic atherosclerosis. Electronically Signed   By: David  Martinique M.D.   On: 11/11/2016 14:50    Assessment and Plan:   1.  Elevated troponin in the setting of urosepsis, fever, hypotension, tachycardia and acute renal failure.  Per his daughter, he has not complained of any chest pain or SOB prior to getting sick.  This is likely  Related to demand ischemia and not ACS.   - will cycle troponin -  check 2D echo in am to assess LVF (low normal in Jan 2018) - given advanced age, debilitated state, urosepsis and acute renal failure, he is not a candidate for invasive cardiac procedures at this time and likely not in the future. - continue statin and ASA  2.  ASCAD s/p remote CABG - continue statin and ASA  3.  Chronic LBBB - unchanged from prior EKG  4.  SVT - tele shows several runs of SVT with same QRS morphology as baseline LBBB and therefore likely SVT secondary to his underlying illness.  He is only having short bursts at this time.  BP too soft to add BB.  This is likely being driven by his fever and sepsis and hopefully will improve as acute illness improves.  5.  AKI secondary to urosepsis and fever.  Per TRH.  6.  Bicuspid AV with moderate AS by echo 04/2016   Signed, Fransico Him, MD  11/11/2016 6:23 PM

## 2016-11-11 NOTE — ED Notes (Signed)
Cardiology and EDP aware of patient's arrhythmia and HR, which increases to 160s now, instead of 200s, and is happening less frequently. No new orders at this time. Family at bedside.

## 2016-11-11 NOTE — Telephone Encounter (Signed)
Spoke with wife and made her aware of Dr. Velora Mediate message. She understood and had no additional questions at this time. Nothing further is needed

## 2016-11-11 NOTE — ED Notes (Signed)
EDP at bedside  

## 2016-11-11 NOTE — ED Provider Notes (Signed)
I was called to bedside due to continued agitation / delirium, and labile heart rate with atrial fibrillation. After discussion with our cardiology colleagues who have consulted on the patient's care patient is starting amiodarone bolus, drip. Patient has received Haldol as well.  8:27 PM Patient slightly more calm.  Heart rate was variable, with diminished tachycardia, rate 90/120. Patient went to an atrial fibrillation.  He is receiving amiodarone. The patient's wife is now present, and I discussed findings with our with her, including evidence for acute kidney injury, urinary tract infection, delirium.  9:25 PM Repeat lactic acid increased, >5.  Repeat troponin also increased.  I discussed CODE STATUS with the patient's wife, and 2 children. They confirm that the patient would not want intubation, but would want CPR. Patient's agitation is diminished somewhat.  He remains diaphoretic, uncomfortable appearing. Breath sounds are clear.  Patient will receive additional fluid resuscitation.   12:03 AM Patient remains agitated. I discussed patient's case with our critical care colleagues. With the family's request for no intubation, patient does not be criteria for ICD placement, but we'll continue waiting for a stepdown bed. Hospitalist admitting service is aware of this recommendation. With his persistent agitation, the patient will receive additional dose of Haldol. He has received additional fluid resuscitation, heart rate remains variable, but generally improved, on amiodarone drip.   CRITICAL CARE Performed by: Carmin Muskrat Total critical care time: 45 minutes Critical care time was exclusive of separately billable procedures and treating other patients. Critical care was necessary to treat or prevent imminent or life-threatening deterioration. Critical care was time spent personally by me on the following activities: development of treatment plan with patient and/or surrogate  as well as nursing, discussions with consultants, evaluation of patient's response to treatment, examination of patient, obtaining history from patient or surrogate, ordering and performing treatments and interventions, ordering and review of laboratory studies, ordering and review of radiographic studies, pulse oximetry and re-evaluation of patient's condition.      Carmin Muskrat, MD 11/12/16 [phone]

## 2016-11-11 NOTE — Telephone Encounter (Signed)
He has been in the emergency room before in past  Was  for felt to be a side effect   from the hydrocodone that he was previously on. And had worked on trying to have him taper  And get off this medication use and stop it  stop it. I don't have enough information to otherwise advise.  So if altered Mental status  Then    Go to ED  .

## 2016-11-11 NOTE — ED Notes (Signed)
Cardiac monitor alarmed, showing v-tach. Patient converted back to A-Fib with rate between 90-125 before EKG could be captured. Alarm strip printed out and showed to Dr. Marily Memos. Pt is fidgety, but able to consoled by wife at bedside.

## 2016-11-11 NOTE — Progress Notes (Signed)
Pharmacy Antibiotic Note  Austin Rose is a 83 y.o. male admitted on 11/11/2016 with sepsis.  Pharmacy has been consulted for vancomycin, levaquin, aztreonam dosing. Acute renal failure noted with sCr 2.44 and CrCl 21 ml/min.  Vancomycin trough goal 15-20  Plan: 1) Vancomycin 1g IV q48 2) Levaquin 750mg  IV x 1 then 500mg  IV q48 3) Aztreonam 2g IV x 1 then 1g IV q8 4) Follow renal function, cultures, LOT, level if needed  Weight: 170 lb (77.1 kg)  Temp (24hrs), Avg:98.4 F (36.9 C), Min:98.4 F (36.9 C), Max:98.4 F (36.9 C)   Recent Labs Lab 11/11/16 1333 11/11/16 1400  WBC 8.0  --   CREATININE 2.44*  --   LATICACIDVEN  --  2.19*    Estimated Creatinine Clearance: 20.5 mL/min (A) (by C-G formula based on SCr of 2.44 mg/dL (H)).    Allergies  Allergen Reactions  . Fentanyl     Confusion   . Gabapentin Other (See Comments)    Behavior changes noted by family  Better when stopped  2015  . Norco [Hydrocodone-Acetaminophen]     Confusion   . Penicillins Other (See Comments)    unknown  . Tramadol     confusion    Antimicrobials this admission: 7/30 Vancomycin >> 7/30 Levaquin >> 7/30 Aztreonam >>  Dose adjustments this admission: n/a  Microbiology results: 7/30 blood cx >>  Thank you for allowing pharmacy to be a part of this patient's care.  Deboraha Sprang 11/11/2016 3:24 PM

## 2016-11-11 NOTE — ED Provider Notes (Signed)
[facility] DEPT Provider Note   CSN: [id number] Arrival date & time:        History   Chief Complaint Chief Complaint  Patient presents with  . Altered Mental Status    HPI Austin Rose is a 83 y.o. male.  Patient with history of coronary artery disease, previous narcotic use secondary to neuropathy now off of opioids -- presents with complaint of altered mental status. Brought in by son who gave history. Patient typically lives at home with his wife, gardens and is active. Starting on Friday has had intermittent fevers. He has had dark strong smelling urine with incontinence. Treated with Azo. He has had 2 days of fevers, altered LOC, but confusion but much worse this morning when he did not recognize his family. He is moving all extremities. Level V caveat due to altered mental status.  History of ACS, CABG about 17 years ago.       Past Medical History:  Diagnosis Date  . Arthritis   . CAD (coronary artery disease)   . Cancer ([place])   . History of stress test 09/20/2008  . Hx of echocardiogram 07/20/2010   The cavity size was normal, there is mild aortic stenosis,the aortic root was normal is size, the ascending aorta was normal in size.  [name] Hx of varicella   . Hyperlipemia   . Hypertension   . Orthostatic hypotension    [facility]    Patient Active Problem List   Diagnosis Date Noted  . Confusion 07/16/2016  . Cyanocobalamin deficiency 07/16/2016  . Coronary artery disease involving native coronary artery of native heart without angina pectoris   . Arterial hypotension   . History of cerebral infarction 06/04/2016  . Hearing decreased, unspecified laterality 06/04/2016  . Restless legs 06/04/2016  . Multiple lacunar infarcts ([place]) 04/30/2016  . Calculus of gallbladder without cholecystitis without obstruction 04/30/2016  . Thrombocytopenia ([place]) 04/30/2016  . Expressive aphasia 04/18/2016  . CAD (coronary artery disease) of artery bypass graft  04/18/2016  . TIA (transient ischemic attack) 04/17/2016  . Senile ecchymosis 09/20/2014  . Hyperlipidemia 06/07/2014  . Primary osteoarthritis involving multiple joints 01/20/2014  . Coronary artery disease due to lipid rich plaque 01/20/2014  . Age factor 01/20/2014  . Medication side effect 01/20/2014  . Smokeless tobacco use within past 30 days 06/03/2013  . Skin abnormalities 05/18/2013  . Pain management 05/18/2013  . Abnormal TSH 05/18/2013  . Unspecified vitamin D deficiency 05/18/2013  . Viral URI with cough 11/04/2012  . Sleep difficulties 11/06/2011  . High risk medication use 04/10/2011  . Chronic orthostatic hypotension 05/16/2010  . Labyrinthitis 05/16/2010  . DIZZINESS 02/23/2009  . DEGENERATIVE JOINT DISEASE, SHOULDER 07/20/2008  . ARTHRITIS, KNEES, BILATERAL 07/20/2008  . FROZEN RIGHT SHOULDER 07/20/2008  . Hypothyroidism 11/11/2007  . Hereditary and idiopathic peripheral neuropathy 11/11/2007  . CONSTIPATION, CHRONIC 11/11/2007  . OSTEOPENIA 11/11/2007  . GASTRIC ULCER, HX OF 11/11/2007  . HYPERLIPIDEMIA 03/31/2007  . Essential hypertension 03/31/2007  . CORONARY ARTERY DISEASE 03/31/2007  . IBS 03/31/2007    Past Surgical History:  Procedure Laterality Date  . BACK SURGERY     15s1Repeat Foraminotomy  . cataract surgery     [facility]   . CORONARY ARTERY BYPASS GRAFT  04/1999   Dr Roxan Hockey, showing 4 vessel disease and underwent multivessel CABG aththat time.  . LUMBAR DISC SURGERY     L4-5  decompression  . microdisscectomy     bilateral  . OTHER SURGICAL  HISTORY     central decompression       Home Medications    Prior to Admission medications   Medication Sig Start Date End Date Taking? Authorizing Provider  aspirin 325 MG tablet Take 1 tablet (325 mg total) by mouth daily. 04/20/16   Theodis Blaze, MD  Cyanocobalamin (B-12 PO) Take 1 tablet by mouth 2 (two) times daily.     [provider]  midodrine (PROAMATINE) 5 MG tablet Take  5 mg by mouth 2 (two) times daily with a meal. 1 tab  8AM, 1 PM and 1 at 6 PM- Pt gets thru the [facility]  08/16/10   [provider]  SIMVASTATIN PO Take 0.5 tablets by mouth daily.    [provider]  traZODone (DESYREL) 50 MG tablet Take 1 tablet (50 mg total) by mouth at bedtime as needed for sleep. 07/16/16   Charlesetta Shanks, MD    Family History Family History  Problem Relation Age of Onset  . Other Mother 25       brain aneursym  . Anuerysm Mother   . Heart attack Father     Social History Social History  Substance Use Topics  . Smoking status: Former Research scientist (life sciences)  . Smokeless tobacco: Current User    Types: Chew    Last attempt to quit: 04/15/1978  . Alcohol use No     Allergies   Fentanyl; Gabapentin; Norco [hydrocodone-acetaminophen]; Penicillins; and Tramadol   Review of Systems Review of Systems  Unable to perform ROS: Mental status change     Physical Exam Updated Vital Signs BP (!) 126/99 (BP Location: Right Arm)   Pulse (!) 112   Temp 98.4 F (36.9 C) (Rectal)   Resp (!) 22   SpO2 97%   Physical Exam  Constitutional: He appears well-developed and well-nourished.  HENT:  Head: Normocephalic and atraumatic.  Eyes: Conjunctivae are normal. Right eye exhibits no discharge. Left eye exhibits no discharge.  Neck: Normal range of motion. Neck supple.  Cardiovascular: Normal rate, regular rhythm and normal heart sounds.   Pulmonary/Chest: Effort normal and breath sounds normal. No respiratory distress. He has no rales.  Abdominal: Soft. He exhibits no distension.  Musculoskeletal: He exhibits no edema or tenderness.  Neurological: He is alert. He has normal strength. He is disoriented.  Neuro exam limited due to patient cooperation and altered mental status. He moves all extremities. He seems very confused. Will not follow commands. Tries to push away my arm when checking pulse or auscultating his chest.   Skin: Skin is warm and dry.  Some minor  bruising, no skin infection.   Nursing note and vitals reviewed.    ED Treatments / Results  Labs (all labs ordered are listed, but only abnormal results are displayed) Labs Reviewed  CBC WITH DIFFERENTIAL/PLATELET - Abnormal; Notable for the following:       Result Value   RBC 3.98 (*)    Hemoglobin 11.8 (*)    HCT 35.3 (*)    Platelets 129 (*)    Lymphs Abs 0.3 (*)    All other components within normal limits  COMPREHENSIVE METABOLIC PANEL - Abnormal; Notable for the following:    CO2 20 (*)    Glucose, Bld 114 (*)    BUN 50 (*)    Creatinine, Ser 2.44 (*)    Total Protein 6.1 (*)    Albumin 2.9 (*)    ALT 15 (*)    GFR calc non Af Amer 22 (*)  GFR calc Af Amer 25 (*)    All other components within normal limits  TROPONIN I - Abnormal; Notable for the following:    Troponin I 0.09 (*)    All other components within normal limits  I-STAT CG4 LACTIC ACID, ED - Abnormal; Notable for the following:    Lactic Acid, Venous 2.19 (*)    All other components within normal limits  CULTURE, BLOOD (ROUTINE X 2)  CULTURE, BLOOD (ROUTINE X 2)  URINE CULTURE  URINALYSIS, ROUTINE W REFLEX MICROSCOPIC  PROCALCITONIN  I-STAT CG4 LACTIC ACID, ED    ED ECG REPORT   Date: 11/11/2016  Rate: 85  Rhythm: normal sinus rhythm  QRS Axis: left  Intervals: normal  ST/T Wave abnormalities: ST depressions anteriorly  Conduction Disutrbances:left bundle branch block  Narrative Interpretation:   Old EKG Reviewed: changes noted, New anterior ST segment depression  I have personally reviewed the EKG tracing and agree with the computerized printout as noted.  Radiology Ct Head Wo Contrast  Result Date: 11/11/2016 CLINICAL DATA:  Altered level of consciousness.  Confusion EXAM: CT HEAD WITHOUT CONTRAST TECHNIQUE: Contiguous axial images were obtained from the base of the skull through the vertex without intravenous contrast. COMPARISON:  CT head 08/13/2016 FINDINGS: Brain: Moderate atrophy.  Chronic microvascular ischemic change in the white matter. Negative for acute infarct, hemorrhage, or mass lesion. No shift of the midline structures. Vascular: Negative for hyperdense vessel. Skull: Negative Sinuses/Orbits: Negative Other: None IMPRESSION: Atrophy and chronic microvascular ischemia.  No acute abnormality. Electronically Signed   By: Franchot Gallo M.D.   On: 11/11/2016 15:26   Dg Chest Portable 1 View  Result Date: 11/11/2016 CLINICAL DATA:  Altered level of consciousness, patient undergoing sepsis evaluation. History of coronary artery disease, hypertension, former smoker. EXAM: PORTABLE CHEST 1 VIEW COMPARISON:  AP chest x-ray of Aug 13, 2016. FINDINGS: The lungs are adequately inflated and clear. The heart is top-normal in size. The pulmonary vascularity is normal. There is tortuosity of the ascending and descending thoracic aorta with mural calcification. The patient has undergone previous CABG. The observed bony thorax exhibits no acute abnormality. IMPRESSION: There is no acute cardiopulmonary abnormality.  Previous CABG. Thoracic aortic atherosclerosis. Electronically Signed   By: David  Martinique M.D.   On: 11/11/2016 14:50    Procedures Procedures (including critical care time)  Medications Ordered in ED Medications  sodium chloride 0.9 % bolus 1,000 mL (1,000 mLs Intravenous New Bag/Given 11/11/16 1609)  sodium chloride 0.9 % bolus 250 mL (not administered)  aztreonam (AZACTAM) 2 g in dextrose 5 % 50 mL IVPB (2 g Intravenous New Bag/Given 11/11/16 1610)  vancomycin (VANCOCIN) IVPB 1000 mg/200 mL premix (not administered)  levofloxacin (LEVAQUIN) IVPB 500 mg (not administered)  vancomycin (VANCOCIN) IVPB 1000 mg/200 mL premix (not administered)  aztreonam (AZACTAM) 1 g in dextrose 5 % 50 mL IVPB (not administered)  sodium chloride 0.9 % bolus 1,000 mL (0 mLs Intravenous Stopped 11/11/16 1559)  levofloxacin (LEVAQUIN) IVPB 750 mg (750 mg Intravenous New Bag/Given 11/11/16 1459)       Initial Impression / Assessment and Plan / ED Course  I have reviewed the triage vital signs and the nursing notes.  Pertinent labs & imaging results that were available during my care of the patient were reviewed by me and considered in my medical decision making (see chart for details).     Patient seen and examined. Work-up initiated. Medications ordered. Spoke with sons at bedside.   Vital signs reviewed  and are as follows: BP (!) 126/99 (BP Location: Right Arm)   Pulse (!) 112   Temp 98.4 F (36.9 C) (Rectal)   Resp (!) 22   SpO2 97%   Patient discussed with Dr. Darl Householder who will see.   Trop slightly elevated. EKG with changes. Spoke with cardiology who will consult.   Patient's daughter at bedside. Discussed advanced directives, she does not know if he has any wishes regarding CPR or breathing tubes. At this point patient will be full code.   4:39 PM Spoke with [facility], admit to stepdown.   Sepsis - Repeat Assessment  Performed at:    1630  Vitals     Blood pressure 97/62, pulse 93, temperature (!) 101.5 F (38.6 C), temperature source Rectal, resp. rate (!) 24, weight 77.1 kg (170 lb), SpO2 100 %.  Heart:     Regular rate and rhythm  Lungs:    CTA  Capillary Refill:   <2 sec  Peripheral Pulse:   Radial pulse palpable  Skin:     Dry   CRITICAL CARE Performed by: Faustino Congress Total critical care time: 45 minutes Critical care time was exclusive of separately billable procedures and treating other patients. Critical care was necessary to treat or prevent imminent or life-threatening deterioration. Critical care was time spent personally by me on the following activities: development of treatment plan with patient and/or surrogate as well as nursing, discussions with consultants, evaluation of patient's response to treatment, examination of patient, obtaining history from patient or surrogate, ordering and performing treatments and interventions, ordering and  review of laboratory studies, ordering and review of radiographic studies, pulse oximetry and re-evaluation of patient's condition.   Final Clinical Impressions(s) / ED Diagnoses   Final diagnoses:  Sepsis, due to unspecified organism ([place])  Acute kidney injury ([place])  Elevated troponin   Admit.   New Prescriptions New Prescriptions   No medications on file         Carlisle Cater, Hershal Coria 11/11/16 1640    Drenda Freeze, MD 11/13/16 [phone]

## 2016-11-11 NOTE — ED Notes (Signed)
Called main lab added Trop I to blood in lab.

## 2016-11-11 NOTE — Progress Notes (Signed)
Attempted ABG pt very tensed moving around and unable to relax with help of RN and EMS was still unable to get.  Pt has just been given something to calm him will give that a few minutes to kick in and come back and attempt again.

## 2016-11-11 NOTE — ED Notes (Signed)
EDP, Cardiology PA-C at bedside.

## 2016-11-12 ENCOUNTER — Other Ambulatory Visit (HOSPITAL_COMMUNITY): Payer: Medicare HMO

## 2016-11-12 ENCOUNTER — Inpatient Hospital Stay (HOSPITAL_COMMUNITY): Payer: Medicare HMO

## 2016-11-12 DIAGNOSIS — J181 Lobar pneumonia, unspecified organism: Secondary | ICD-10-CM

## 2016-11-12 DIAGNOSIS — L8990 Pressure ulcer of unspecified site, unspecified stage: Secondary | ICD-10-CM | POA: Insufficient documentation

## 2016-11-12 DIAGNOSIS — D696 Thrombocytopenia, unspecified: Secondary | ICD-10-CM

## 2016-11-12 DIAGNOSIS — E784 Other hyperlipidemia: Secondary | ICD-10-CM

## 2016-11-12 DIAGNOSIS — R7881 Bacteremia: Secondary | ICD-10-CM

## 2016-11-12 DIAGNOSIS — I5021 Acute systolic (congestive) heart failure: Secondary | ICD-10-CM

## 2016-11-12 DIAGNOSIS — I340 Nonrheumatic mitral (valve) insufficiency: Secondary | ICD-10-CM

## 2016-11-12 DIAGNOSIS — R6520 Severe sepsis without septic shock: Secondary | ICD-10-CM

## 2016-11-12 DIAGNOSIS — I350 Nonrheumatic aortic (valve) stenosis: Secondary | ICD-10-CM

## 2016-11-12 LAB — BLOOD CULTURE ID PANEL (REFLEXED)
Acinetobacter baumannii: NOT DETECTED
CANDIDA KRUSEI: NOT DETECTED
CARBAPENEM RESISTANCE: NOT DETECTED
Candida albicans: NOT DETECTED
Candida glabrata: NOT DETECTED
Candida parapsilosis: NOT DETECTED
Candida tropicalis: NOT DETECTED
ENTEROBACTERIACEAE SPECIES: DETECTED — AB
ENTEROCOCCUS SPECIES: NOT DETECTED
ESCHERICHIA COLI: DETECTED — AB
Enterobacter cloacae complex: NOT DETECTED
Haemophilus influenzae: NOT DETECTED
KLEBSIELLA OXYTOCA: NOT DETECTED
Klebsiella pneumoniae: NOT DETECTED
LISTERIA MONOCYTOGENES: NOT DETECTED
NEISSERIA MENINGITIDIS: NOT DETECTED
PSEUDOMONAS AERUGINOSA: NOT DETECTED
Proteus species: NOT DETECTED
STAPHYLOCOCCUS AUREUS BCID: NOT DETECTED
STAPHYLOCOCCUS SPECIES: NOT DETECTED
STREPTOCOCCUS AGALACTIAE: NOT DETECTED
STREPTOCOCCUS PNEUMONIAE: NOT DETECTED
STREPTOCOCCUS PYOGENES: NOT DETECTED
Serratia marcescens: NOT DETECTED
Streptococcus species: NOT DETECTED

## 2016-11-12 LAB — ECHOCARDIOGRAM COMPLETE
AO mean calculated velocity dopler: 205 cm/s
AOVTI: 55.5 cm
AV Area VTI index: 0.59 cm2/m2
AV Area mean vel: 1.04 cm2
AV Mean grad: 19 mmHg
AV VEL mean LVOT/AV: 0.2
AV peak Index: 0.57
AV pk vel: 281 cm/s
AVAREAMEANVIN: 0.55 cm2/m2
AVAREAVTI: 1.08 cm2
AVPG: 32 mmHg
Ao pk vel: 0.2 m/s
CHL CUP AV VEL: 1.13
FS: 8 % — AB (ref 28–44)
HEIGHTINCHES: 69 in
IVS/LV PW RATIO, ED: 0.87
LA ID, A-P, ES: 51 mm
LA vol A4C: 74.9 ml
LA vol index: 38.7 mL/m2
LADIAMINDEX: 2.68 cm/m2
LAVOL: 73.5 mL
LEFT ATRIUM END SYS DIAM: 51 mm
LV PW d: 9.09 mm — AB (ref 0.6–1.1)
LVOT VTI: 11.8 cm
LVOT area: 5.31 cm2
LVOT diameter: 26 mm
LVOT peak VTI: 0.21 cm
LVOTPV: 57.1 cm/s
LVOTSV: 63 mL
Valve area index: 0.59
Valve area: 1.13 cm2
WEIGHTICAEL: 2617.3 [oz_av]

## 2016-11-12 LAB — LACTIC ACID, PLASMA
LACTIC ACID, VENOUS: 3.3 mmol/L — AB (ref 0.5–1.9)
LACTIC ACID, VENOUS: 1.9 mmol/L (ref 0.5–1.9)
LACTIC ACID, VENOUS: 1.7 mmol/L (ref 0.5–1.9)

## 2016-11-12 LAB — RESPIRATORY PANEL BY PCR
ADENOVIRUS-RVPPCR: NOT DETECTED
Bordetella pertussis: NOT DETECTED
CHLAMYDOPHILA PNEUMONIAE-RVPPCR: NOT DETECTED
CORONAVIRUS 229E-RVPPCR: NOT DETECTED
CORONAVIRUS NL63-RVPPCR: NOT DETECTED
CORONAVIRUS OC43-RVPPCR: NOT DETECTED
Coronavirus HKU1: NOT DETECTED
INFLUENZA A-RVPPCR: NOT DETECTED
INFLUENZA B-RVPPCR: NOT DETECTED
MYCOPLASMA PNEUMONIAE-RVPPCR: NOT DETECTED
Metapneumovirus: NOT DETECTED
PARAINFLUENZA VIRUS 1-RVPPCR: NOT DETECTED
Parainfluenza Virus 2: NOT DETECTED
Parainfluenza Virus 3: NOT DETECTED
Parainfluenza Virus 4: NOT DETECTED
RESPIRATORY SYNCYTIAL VIRUS-RVPPCR: NOT DETECTED
Rhinovirus / Enterovirus: NOT DETECTED

## 2016-11-12 LAB — COMPREHENSIVE METABOLIC PANEL
ALBUMIN: 2.2 g/dL — AB (ref 3.5–5.0)
ALT: 18 U/L (ref 17–63)
AST: 50 U/L — AB (ref 15–41)
Alkaline Phosphatase: 72 U/L (ref 38–126)
Anion gap: 9 (ref 5–15)
BUN: 48 mg/dL — AB (ref 6–20)
CHLORIDE: 114 mmol/L — AB (ref 101–111)
CO2: 14 mmol/L — AB (ref 22–32)
Calcium: 7.6 mg/dL — ABNORMAL LOW (ref 8.9–10.3)
Creatinine, Ser: 2.4 mg/dL — ABNORMAL HIGH (ref 0.61–1.24)
GFR calc Af Amer: 26 mL/min — ABNORMAL LOW (ref >60)
GFR calc non Af Amer: 22 mL/min — ABNORMAL LOW (ref >60)
GLUCOSE: 111 mg/dL — AB (ref 65–99)
POTASSIUM: 3.4 mmol/L — AB (ref 3.5–5.1)
Sodium: 137 mmol/L (ref 135–145)
Total Bilirubin: 0.6 mg/dL (ref 0.3–1.2)
Total Protein: 4.8 g/dL — ABNORMAL LOW (ref 6.5–8.1)

## 2016-11-12 LAB — CBC
HEMATOCRIT: 30.5 % — AB (ref 39.0–52.0)
Hemoglobin: 10.5 g/dL — ABNORMAL LOW (ref 13.0–17.0)
MCH: 30.4 pg (ref 26.0–34.0)
MCHC: 34.4 g/dL (ref 30.0–36.0)
MCV: 88.4 fL (ref 78.0–100.0)
Platelets: 81 10*3/uL — ABNORMAL LOW (ref 150–400)
RBC: 3.45 MIL/uL — ABNORMAL LOW (ref 4.22–5.81)
RDW: 14.7 % (ref 11.5–15.5)
WBC: 6.8 10*3/uL (ref 4.0–10.5)

## 2016-11-12 LAB — TROPONIN I
TROPONIN I: 5.58 ng/mL — AB (ref <0.03)
TROPONIN I: 6.32 ng/mL — AB (ref <0.03)
Troponin I: 2.49 ng/mL (ref <0.03)
Troponin I: 7.64 ng/mL (ref <0.03)

## 2016-11-12 LAB — MRSA PCR SCREENING: MRSA by PCR: NEGATIVE

## 2016-11-12 LAB — TSH: TSH: 3.232 u[IU]/mL (ref 0.350–4.500)

## 2016-11-12 LAB — MAGNESIUM: MAGNESIUM: 1.8 mg/dL (ref 1.7–2.4)

## 2016-11-12 MED ORDER — SODIUM CHLORIDE 0.9 % IV BOLUS (SEPSIS)
1000.0000 mL | Freq: Once | INTRAVENOUS | Status: AC
  Administered 2016-11-12: 1000 mL via INTRAVENOUS

## 2016-11-12 MED ORDER — ONDANSETRON HCL 4 MG/2ML IJ SOLN: 4.0000 mg | Freq: Four times a day (QID) | INTRAMUSCULAR | Status: DC | PRN

## 2016-11-12 MED ORDER — LEVOFLOXACIN IN D5W 750 MG/150ML IV SOLN: 750.0000 mg | Freq: Once | INTRAVENOUS | Status: DC

## 2016-11-12 MED ORDER — SODIUM CHLORIDE 0.9 % IV BOLUS (SEPSIS): 500.0000 mL | INTRAVENOUS | Status: DC

## 2016-11-12 MED ORDER — HALOPERIDOL LACTATE 5 MG/ML IJ SOLN
5.0000 mg | Freq: Once | INTRAMUSCULAR | Status: AC
  Administered 2016-11-12: 5 mg via INTRAVENOUS
  Filled 2016-11-12: qty 1

## 2016-11-12 MED ORDER — LEVALBUTEROL HCL 1.25 MG/0.5ML IN NEBU
INHALATION_SOLUTION | RESPIRATORY_TRACT | Status: AC
  Administered 2016-11-12: 1.25 mg via RESPIRATORY_TRACT
  Filled 2016-11-12: qty 0.5

## 2016-11-12 MED ORDER — ENOXAPARIN SODIUM 30 MG/0.3ML ~~LOC~~ SOLN
30.0000 mg | SUBCUTANEOUS | Status: DC
  Administered 2016-11-12: 30 mg via SUBCUTANEOUS
  Filled 2016-11-12: qty 0.3

## 2016-11-12 MED ORDER — SODIUM CHLORIDE 0.9% FLUSH
3.0000 mL | Freq: Two times a day (BID) | INTRAVENOUS | Status: DC
  Administered 2016-11-12 – 2016-11-20 (×14): 3 mL via INTRAVENOUS

## 2016-11-12 MED ORDER — ONDANSETRON HCL 4 MG PO TABS: 4.0000 mg | ORAL_TABLET | Freq: Four times a day (QID) | ORAL | Status: DC | PRN

## 2016-11-12 MED ORDER — ORAL CARE MOUTH RINSE
15.0000 mL | Freq: Two times a day (BID) | OROMUCOSAL | Status: DC
  Administered 2016-11-13 – 2016-11-20 (×10): 15 mL via OROMUCOSAL

## 2016-11-12 MED ORDER — METHYLPREDNISOLONE SODIUM SUCC 125 MG IJ SOLR
60.0000 mg | Freq: Two times a day (BID) | INTRAMUSCULAR | Status: DC
  Administered 2016-11-12 – 2016-11-13 (×2): 60 mg via INTRAVENOUS
  Filled 2016-11-12 (×2): qty 2

## 2016-11-12 MED ORDER — VANCOMYCIN HCL IN DEXTROSE 1-5 GM/200ML-% IV SOLN: 1000.0000 mg | Freq: Once | INTRAVENOUS | Status: DC

## 2016-11-12 MED ORDER — LEVALBUTEROL HCL 1.25 MG/0.5ML IN NEBU
1.2500 mg | INHALATION_SOLUTION | Freq: Four times a day (QID) | RESPIRATORY_TRACT | Status: DC
  Administered 2016-11-12 – 2016-11-13 (×3): 1.25 mg via RESPIRATORY_TRACT
  Filled 2016-11-12 (×3): qty 0.5

## 2016-11-12 MED ORDER — AZTREONAM 2 G IJ SOLR: 2.0000 g | Freq: Once | INTRAMUSCULAR | Status: DC

## 2016-11-12 MED ORDER — CHLORHEXIDINE GLUCONATE 0.12 % MT SOLN
15.0000 mL | Freq: Two times a day (BID) | OROMUCOSAL | Status: DC
  Administered 2016-11-12 – 2016-11-20 (×15): 15 mL via OROMUCOSAL
  Filled 2016-11-12 (×13): qty 15

## 2016-11-12 MED ORDER — SODIUM CHLORIDE 0.9 % IV BOLUS (SEPSIS)
1000.0000 mL | INTRAVENOUS | Status: AC
  Administered 2016-11-12: 1000 mL via INTRAVENOUS

## 2016-11-12 MED ORDER — LEVALBUTEROL HCL 1.25 MG/0.5ML IN NEBU
1.2500 mg | INHALATION_SOLUTION | RESPIRATORY_TRACT | Status: AC
  Administered 2016-11-12: 1.25 mg via RESPIRATORY_TRACT

## 2016-11-12 MED ORDER — LEVALBUTEROL HCL 1.25 MG/0.5ML IN NEBU: 1.2500 mg | INHALATION_SOLUTION | Freq: Four times a day (QID) | RESPIRATORY_TRACT | Status: DC

## 2016-11-12 NOTE — ED Notes (Signed)
Admitting MD paged r/e patient's BP. Patient asleep.

## 2016-11-12 NOTE — Progress Notes (Signed)
  Echocardiogram 2D Echocardiogram has been performed.  Austin Rose 11/12/2016, 6:31 PM

## 2016-11-12 NOTE — Progress Notes (Signed)
Wife wanted all 4 side rails up - explained to wife that this is considered a restraint - Wife requesting that all 4 Side rails up - all 4 side rails placed up & bed alarm on at all times Pt very restless wiggles in bed and pulls himself over to the side- repositioned frequently to get his head away from the rail

## 2016-11-12 NOTE — Progress Notes (Signed)
PHARMACY - PHYSICIAN COMMUNICATION CRITICAL VALUE ALERT - BLOOD CULTURE IDENTIFICATION (BCID)  Results for orders placed or performed during the hospital encounter of 11/11/16  Blood Culture ID Panel (Reflexed) (Collected: 11/11/2016  2:15 PM)  Result Value Ref Range   Enterococcus species NOT DETECTED NOT DETECTED   Listeria monocytogenes NOT DETECTED NOT DETECTED   Staphylococcus species NOT DETECTED NOT DETECTED   Staphylococcus aureus NOT DETECTED NOT DETECTED   Streptococcus species NOT DETECTED NOT DETECTED   Streptococcus agalactiae NOT DETECTED NOT DETECTED   Streptococcus pneumoniae NOT DETECTED NOT DETECTED   Streptococcus pyogenes NOT DETECTED NOT DETECTED   Acinetobacter baumannii NOT DETECTED NOT DETECTED   Enterobacteriaceae species DETECTED (A) NOT DETECTED   Enterobacter cloacae complex NOT DETECTED NOT DETECTED   Escherichia coli DETECTED (A) NOT DETECTED   Klebsiella oxytoca NOT DETECTED NOT DETECTED   Klebsiella pneumoniae NOT DETECTED NOT DETECTED   Proteus species NOT DETECTED NOT DETECTED   Serratia marcescens NOT DETECTED NOT DETECTED   Carbapenem resistance NOT DETECTED NOT DETECTED   Haemophilus influenzae NOT DETECTED NOT DETECTED   Neisseria meningitidis NOT DETECTED NOT DETECTED   Pseudomonas aeruginosa NOT DETECTED NOT DETECTED   Candida albicans NOT DETECTED NOT DETECTED   Candida glabrata NOT DETECTED NOT DETECTED   Candida krusei NOT DETECTED NOT DETECTED   Candida parapsilosis NOT DETECTED NOT DETECTED   Candida tropicalis NOT DETECTED NOT DETECTED    Name of physician (or Provider) Contacted: Forrest Moron via text page.  Changes to prescribed antibiotics required: Covered w/ both Azactam and Levaquin, could narrow if clinically appropriate.  Wynona Neat, PharmD, BCPS  11/12/2016  3:07 AM

## 2016-11-12 NOTE — Progress Notes (Signed)
PROGRESS NOTE    Austin Rose  MBW:[id number] DOB: 1925-05-21 DOA: 11/11/2016 PCP: Burnis Medin, MD   Brief Narrative:  83 y.o. WM PMHx Orthostatic Hypotension on Midodrine, [facility],  Aortic Stenosis with Pulmonary HTN, remote CAD/CABG 17 years prior, arthritis and HLD Cancer .   Patient sent to the ER after developing progressive confusion (did not recognize family members) in the setting of persistent fevers that began this past Friday. By this morning he was not speaking although he was moving all extremities 4. He was also incontinent of a large amount of urine. Family reports that urine is dark colored and very strong smelling. In the ER chest x-ray was unremarkable, rectal temperature was 017.7, systolic blood pressure in the 90-110 range, room air saturations were 97%. He was found to have acute kidney injury with mild metabolic acidosis, lactic acid was elevated at 2.19, troponin mildly elevated at 0.09 with subtle ST segment changes in leads V2 through V5. He was also found to have mild thrombocytopenia. Urinalysis and culture pending. CT head unremarkable. Patient met sepsis criteria and therefore given suboptimal blood pressures and elevated lactic acid EDP opted to give patient 30 mL/kg bolus saline and has requested hospitalist team admit patient.   Subjective: 7/31 patient alert, agitated, will answer simple questions yes and no, recognizes his wife. Negative CP, negative SOB, negative abdominal pain   Assessment & Plan:   Principal Problem:   Sepsis ([place]) Active Problems:   Acute kidney injury (HCC)   CAD (coronary artery disease)   Arthritis   Elevated troponin   Thrombocytopenia, idiopathic (HCC)   Orthostatic hypotension   Acute metabolic encephalopathy   Aortic stenosis, moderate w/ mild pulmonary HTN   Hyperlipemia   Severe Sepsis/Escherichia coli bacteremia -Upon admission patient meets criteria for sepsis: Temp> 30C, HR> 90,  RR> 20. Positive lactic acidosis, positive altered mental status  -Trending lactic acid -Normal saline 127m/hr -Patient will need antibiotics for at least 2 weeks -After susceptibilities post narrow antibiotics   Pneumonia? -Xopenex QID -Solu-Medrol 60 mg BID -Respiratory virus panel negative  Acute kidney injury(baseline Cr 1.15) Lab Results  Component Value Date   CREATININE 2.40 (H) 11/12/2016   CREATININE 2.44 (H) 11/11/2016   CREATININE 1.15 093/90/3009  Acute systolic CHF/Aortic stenosis,  -Echocardiogram: See results below -Consistent with acute cardiac event. Cardiology following - Elevated troponin/CAD -Troponins trending up  Recent Labs Lab 11/11/16 1333 11/11/16 2024 11/12/16 0052 11/12/16 0740 11/12/16 1351  TROPONINI 0.09* 0.25* 2.49* 5.58* 7.64*  -Echocardiogram pending -Given patient's advanced age, multiple medical problems would not be candidate for invasive procedure. -Cardiology consulted: Concur most likely demand ischemia however even if MRI patient not candidate for invasive procedure.   Orthostatic hypotension -Chronic problem followed by the [facility]  Acute metabolic encephalopathy -Nothing by mouth until cognition improved    Thrombocytopenia, idiopathic  -Current platelets greater than 100,000 -Possibly related to gram-negative infection -Follow labs    Arthritis -History of altered mentation secondary to narcotic pain medications in the past 12 months -Has associated neuropathy therefore on Neurontin at home -Home trazodone on hold secondary to altered mentation    Hyperlipemia -Currently holding home Zocor secondary to altered mentation   DVT prophylaxis: Lovenox Code Status: Full Family Communication: Wife at bedside Disposition Plan: TBD   Consultants:  Cardiology  Procedures/Significant Events:  7/31 Echocardiogram : LVEF=: 40% to 45%. Mild diffuse hypokinesis with distinct regional wall   motion  abnormalities. Possible disproprotionately severe hypokinesis of the inferior myocardium.  - Aortic valve: mild to moderate - Left atrium: moderately dilated. - Right atrium: moderately dilated.    VENTILATOR SETTINGS: None   Cultures 7/30 blood positive Escherichia coli 7/30 urine pending 7/31 MRSA by PCR negative 7/31 Respiratory virus panel negative     Antimicrobials: Anti-infectives    Start     Stop   11/13/16 1600  vancomycin (VANCOCIN) IVPB 1000 mg/200 mL premix         11/13/16 1500  levofloxacin (LEVAQUIN) IVPB 500 mg         11/12/16 0015  levofloxacin (LEVAQUIN) IVPB 750 mg  Status:  Discontinued     11/12/16 0252   11/12/16 0015  aztreonam (AZACTAM) 2 g in dextrose 5 % 50 mL IVPB  Status:  Discontinued     11/12/16 0253   11/12/16 0015  vancomycin (VANCOCIN) IVPB 1000 mg/200 mL premix  Status:  Discontinued     11/12/16 0254   11/11/16 2200  aztreonam (AZACTAM) 1 g in dextrose 5 % 50 mL IVPB         11/11/16 1500  levofloxacin (LEVAQUIN) IVPB 750 mg     11/11/16 1652   11/11/16 1500  aztreonam (AZACTAM) 2 g in dextrose 5 % 50 mL IVPB     11/11/16 1653   11/11/16 1500  vancomycin (VANCOCIN) IVPB 1000 mg/200 mL premix     11/11/16 1754       Devices    LINES / TUBES:  None    Continuous Infusions: . sodium chloride 100 mL/hr at 11/12/16 0052  . aztreonam Stopped (11/12/16 0000)  . [START ON 11/13/2016] levofloxacin (LEVAQUIN) IV    . [START ON 11/13/2016] vancomycin       Objective: Vitals:   11/12/16 0115 11/12/16 0156 11/12/16 0200 11/12/16 0300  BP: (!) 79/60 (!) 86/56 (!) 89/65 124/86  Pulse: 81 87 71   Resp: 20 (!) 21 17 (!) 21  Temp:    98.1 F (36.7 C)  TempSrc:    Rectal  SpO2: 98% 96% 99% 100%  Weight:    163 lb 9.3 oz (74.2 kg)  Height:    '5\' 9"'  (1.753 m)    Intake/Output Summary (Last 24 hours) at 11/12/16 1751 Last data filed at 11/12/16 0600  Gross per 24 hour  Intake             5050 ml  Output              150 ml    Net             4900 ml   Filed Weights   11/11/16 1500 11/12/16 0300  Weight: 170 lb (77.1 kg) 163 lb 9.3 oz (74.2 kg)    Examination:  General: Alert, agitated, answer simple questions, follow some commands No acute respiratory distress Neck:  Negative scars, masses, torticollis, lymphadenopathy, JVD Lungs: diffuse coarse breath sounds, positive diffuse wheezing, negative crackles  Cardiovascular: Tachycardic, Regular rhythm without murmur gallop or rub normal S1 and S2 Abdomen: negative abdominal pain, nondistended, positive soft, bowel sounds, no rebound, no ascites, no appreciable mass Extremities: No significant cyanosis, clubbing, or edema bilateral lower extremities Skin: Negative rashes, lesions, ulcers Psychiatric:  Unable to fully evaluate secondary to altered mental status  Central nervous system:  Cranial nerves II through XII intact, patient moving all extremities spontaneously, unable to complete neuro exam secondary to altered mental status. .     Data Reviewed:  Care during the described time interval was provided by me .  I have reviewed this patient's available data, including medical history, events of note, physical examination, and all test results as part of my evaluation. I have personally reviewed and interpreted all radiology studies.  CBC:  Recent Labs Lab 11/11/16 1333  WBC 8.0  NEUTROABS 7.2  HGB 11.8*  HCT 35.3*  MCV 88.7  PLT 242*   Basic Metabolic Panel:  Recent Labs Lab 11/11/16 1333 11/12/16 0052  NA 136  --   K 4.1  --   CL 104  --   CO2 20*  --   GLUCOSE 114*  --   BUN 50*  --   CREATININE 2.44*  --   CALCIUM 9.0  --   MG  --  1.8   GFR: Estimated Creatinine Clearance: 20.5 mL/min (A) (by C-G formula based on SCr of 2.44 mg/dL (H)). Liver Function Tests:  Recent Labs Lab 11/11/16 1333  AST 31  ALT 15*  ALKPHOS 75  BILITOT 1.0  PROT 6.1*  ALBUMIN 2.9*   No results for input(s): LIPASE, AMYLASE in the last 168  hours. No results for input(s): AMMONIA in the last 168 hours. Coagulation Profile: No results for input(s): INR, PROTIME in the last 168 hours. Cardiac Enzymes:  Recent Labs Lab 11/11/16 1333 11/11/16 2024 11/12/16 0052  TROPONINI 0.09* 0.25* 2.49*   BNP (last 3 results) No results for input(s): PROBNP in the last 8760 hours. HbA1C: No results for input(s): HGBA1C in the last 72 hours. CBG: No results for input(s): GLUCAP in the last 168 hours. Lipid Profile: No results for input(s): CHOL, HDL, LDLCALC, TRIG, CHOLHDL, LDLDIRECT in the last 72 hours. Thyroid Function Tests:  Recent Labs  11/12/16 0052  TSH 3.232   Anemia Panel: No results for input(s): VITAMINB12, FOLATE, FERRITIN, TIBC, IRON, RETICCTPCT in the last 72 hours. Urine analysis:    Component Value Date/Time   COLORURINE AMBER (A) 11/11/2016 1510   APPEARANCEUR HAZY (A) 11/11/2016 1510   LABSPEC 1.015 11/11/2016 1510   PHURINE 5.0 11/11/2016 1510   GLUCOSEU NEGATIVE 11/11/2016 1510   HGBUR MODERATE (A) 11/11/2016 1510   HGBUR negative 11/17/2009 1342   BILIRUBINUR NEGATIVE 11/11/2016 1510   BILIRUBINUR n 05/15/2015 1445   KETONESUR NEGATIVE 11/11/2016 1510   PROTEINUR 100 (A) 11/11/2016 1510   UROBILINOGEN 1.0 05/15/2015 1445   UROBILINOGEN 0.2 07/17/2010 1338   NITRITE POSITIVE (A) 11/11/2016 1510   LEUKOCYTESUR SMALL (A) 11/11/2016 1510   Sepsis Labs: '@LABRCNTIP' (procalcitonin:4,lacticidven:4)  ) Recent Results (from the past 240 hour(s))  Culture, blood (Routine X 2) w Reflex to ID Panel     Status: None (Preliminary result)   Collection Time: 11/11/16  2:15 PM  Result Value Ref Range Status   Specimen Description BLOOD LEFT ANTECUBITAL  Final   Special Requests   Final    BOTTLES DRAWN AEROBIC AND ANAEROBIC Blood Culture adequate volume   Culture  Setup Time   Final    GRAM NEGATIVE RODS IN BOTH AEROBIC AND ANAEROBIC BOTTLES Organism ID to follow CRITICAL RESULT CALLED TO, READ BACK BY  AND VERIFIED WITH: V BRYK PHARMD 0304 11/12/16 A BROWNING    Culture GRAM NEGATIVE RODS  Final   Report Status PENDING  Incomplete  Blood Culture ID Panel (Reflexed)     Status: Abnormal   Collection Time: 11/11/16  2:15 PM  Result Value Ref Range Status   Enterococcus species NOT DETECTED NOT DETECTED Final   Listeria  monocytogenes NOT DETECTED NOT DETECTED Final   Staphylococcus species NOT DETECTED NOT DETECTED Final   Staphylococcus aureus NOT DETECTED NOT DETECTED Final   Streptococcus species NOT DETECTED NOT DETECTED Final   Streptococcus agalactiae NOT DETECTED NOT DETECTED Final   Streptococcus pneumoniae NOT DETECTED NOT DETECTED Final   Streptococcus pyogenes NOT DETECTED NOT DETECTED Final   Acinetobacter baumannii NOT DETECTED NOT DETECTED Final   Enterobacteriaceae species DETECTED (A) NOT DETECTED Final    Comment: Enterobacteriaceae represent a large family of gram-negative bacteria, not a single organism. CRITICAL RESULT CALLED TO, READ BACK BY AND VERIFIED WITH: V BRYK PHARMD 0304 11/12/16 A BROWNING    Enterobacter cloacae complex NOT DETECTED NOT DETECTED Final   Escherichia coli DETECTED (A) NOT DETECTED Final    Comment: CRITICAL RESULT CALLED TO, READ BACK BY AND VERIFIED WITH: V BRYK PHARMD 0304 11/12/16 A BROWNING    Klebsiella oxytoca NOT DETECTED NOT DETECTED Final   Klebsiella pneumoniae NOT DETECTED NOT DETECTED Final   Proteus species NOT DETECTED NOT DETECTED Final   Serratia marcescens NOT DETECTED NOT DETECTED Final   Carbapenem resistance NOT DETECTED NOT DETECTED Final   Haemophilus influenzae NOT DETECTED NOT DETECTED Final   Neisseria meningitidis NOT DETECTED NOT DETECTED Final   Pseudomonas aeruginosa NOT DETECTED NOT DETECTED Final   Candida albicans NOT DETECTED NOT DETECTED Final   Candida glabrata NOT DETECTED NOT DETECTED Final   Candida krusei NOT DETECTED NOT DETECTED Final   Candida parapsilosis NOT DETECTED NOT DETECTED Final    Candida tropicalis NOT DETECTED NOT DETECTED Final  Culture, blood (Routine X 2) w Reflex to ID Panel     Status: None (Preliminary result)   Collection Time: 11/11/16  2:58 PM  Result Value Ref Range Status   Specimen Description BLOOD RIGHT ANTECUBITAL  Final   Special Requests   Final    BOTTLES DRAWN AEROBIC AND ANAEROBIC Blood Culture adequate volume   Culture  Setup Time   Final    GRAM NEGATIVE RODS IN BOTH AEROBIC AND ANAEROBIC BOTTLES CRITICAL VALUE NOTED.  VALUE IS CONSISTENT WITH PREVIOUSLY REPORTED AND CALLED VALUE.    Culture PENDING  Incomplete   Report Status PENDING  Incomplete         Radiology Studies: Ct Head Wo Contrast  Result Date: 11/11/2016 CLINICAL DATA:  Altered level of consciousness.  Confusion EXAM: CT HEAD WITHOUT CONTRAST TECHNIQUE: Contiguous axial images were obtained from the base of the skull through the vertex without intravenous contrast. COMPARISON:  CT head 08/13/2016 FINDINGS: Brain: Moderate atrophy. Chronic microvascular ischemic change in the white matter. Negative for acute infarct, hemorrhage, or mass lesion. No shift of the midline structures. Vascular: Negative for hyperdense vessel. Skull: Negative Sinuses/Orbits: Negative Other: None IMPRESSION: Atrophy and chronic microvascular ischemia.  No acute abnormality. Electronically Signed   By: Franchot Gallo M.D.   On: 11/11/2016 15:26   Dg Chest Port 1 View  Result Date: 11/12/2016 CLINICAL DATA:  Atrial fibrillation. EXAM: PORTABLE CHEST 1 VIEW COMPARISON:  11/11/2016. FINDINGS: Prior CABG. Cardiomegaly with normal pulmonary vascularity . No focal infiltrate . Stable elevation left hemidiaphragm. No pleural effusion or pneumothorax P IMPRESSION: 1. Prior CABG.  Stable cardiomegaly. 2.  Stable elevation left hemidiaphragm . Electronically Signed   By: Marcello Moores  Register   On: 11/12/2016 06:44   Dg Chest Portable 1 View  Result Date: 11/11/2016 CLINICAL DATA:  Altered level of consciousness,  patient undergoing sepsis evaluation. History of coronary artery disease, hypertension, former  smoker. EXAM: PORTABLE CHEST 1 VIEW COMPARISON:  AP chest x-ray of Aug 13, 2016. FINDINGS: The lungs are adequately inflated and clear. The heart is top-normal in size. The pulmonary vascularity is normal. There is tortuosity of the ascending and descending thoracic aorta with mural calcification. The patient has undergone previous CABG. The observed bony thorax exhibits no acute abnormality. IMPRESSION: There is no acute cardiopulmonary abnormality.  Previous CABG. Thoracic aortic atherosclerosis. Electronically Signed   By: David  Martinique M.D.   On: 11/11/2016 14:50        Scheduled Meds: . enoxaparin (LOVENOX) injection  30 mg Subcutaneous Q24H  . sodium chloride flush  3 mL Intravenous Q12H   Continuous Infusions: . sodium chloride 100 mL/hr at 11/12/16 0052  . aztreonam Stopped (11/12/16 0000)  . [START ON 11/13/2016] levofloxacin (LEVAQUIN) IV    . [START ON 11/13/2016] vancomycin       LOS: 1 day    Time spent: 40 minutes   Espiridion Supinski, Geraldo Docker, MD [facility] Pager [phone]   If 7PM-7AM, please contact night-coverage [email or web address] Password [facility] 11/12/2016, 7:42 AM

## 2016-11-12 NOTE — Progress Notes (Signed)
Progress Note  Patient Name: Austin Rose Date of Encounter: 11/12/2016  Primary Cardiologist: Dr. Claiborne Billings  Subjective   Remains very confused and delerious  Inpatient Medications    Scheduled Meds: . enoxaparin (LOVENOX) injection  30 mg Subcutaneous Q24H  . sodium chloride flush  3 mL Intravenous Q12H   Continuous Infusions: . sodium chloride 100 mL/hr at 11/12/16 0052  . aztreonam Stopped (11/12/16 6010)  . [START ON 11/13/2016] levofloxacin (LEVAQUIN) IV    . [START ON 11/13/2016] vancomycin     PRN Meds: acetaminophen **OR** acetaminophen, ondansetron **OR** ondansetron (ZOFRAN) IV   Vital Signs    Vitals:   11/12/16 0200 11/12/16 0300 11/12/16 0816 11/12/16 0900  BP: (!) 89/65 124/86 96/70 109/86  Pulse: 71   77  Resp: 17 (!) 21  (!) 24  Temp:  98.1 F (36.7 C) 97.7 F (36.5 C)   TempSrc:  Rectal Oral   SpO2: 99% 100%  100%  Weight:  163 lb 9.3 oz (74.2 kg)    Height:  5\' 9"  (1.753 m)      Intake/Output Summary (Last 24 hours) at 11/12/16 1033 Last data filed at 11/12/16 0600  Gross per 24 hour  Intake             5050 ml  Output              150 ml  Net             4900 ml   Filed Weights   11/11/16 1500 11/12/16 0300  Weight: 170 lb (77.1 kg) 163 lb 9.3 oz (74.2 kg)    Telemetry    NSR with frequent PACs and multifocal atrial tachycardia - Personally Reviewed  ECG    Probable multifocal atrial tachycardia vs. Atrial fibrillation - Personally Reviewed  Physical Exam   GEN: very confused and delerious pulling at his sheets Neck: No JVD Cardiac: RRR, no murmurs, rubs, or gallops.  Respiratory: scattered wheezes GI: Soft, nontender, non-distended  MS: No edema; No deformity. Neuro:  Nonfocal  Psych: Normal affect   Labs    Chemistry Recent Labs Lab 11/11/16 1333 11/12/16 0740  NA 136 137  K 4.1 3.4*  CL 104 114*  CO2 20* 14*  GLUCOSE 114* 111*  BUN 50* 48*  CREATININE 2.44* 2.40*  CALCIUM 9.0 7.6*  PROT 6.1* 4.8*  ALBUMIN  2.9* 2.2*  AST 31 50*  ALT 15* 18  ALKPHOS 75 72  BILITOT 1.0 0.6  GFRNONAA 22* 22*  GFRAA 25* 26*  ANIONGAP 12 9     Hematology Recent Labs Lab 11/11/16 1333 11/12/16 0740  WBC 8.0 6.8  RBC 3.98* 3.45*  HGB 11.8* 10.5*  HCT 35.3* 30.5*  MCV 88.7 88.4  MCH 29.6 30.4  MCHC 33.4 34.4  RDW 14.6 14.7  PLT 129* 81*    Cardiac Enzymes Recent Labs Lab 11/11/16 1333 11/11/16 2024 11/12/16 0052 11/12/16 0740  TROPONINI 0.09* 0.25* 2.49* 5.58*   No results for input(s): TROPIPOC in the last 168 hours.   BNPNo results for input(s): BNP, PROBNP in the last 168 hours.   DDimer No results for input(s): DDIMER in the last 168 hours.   Radiology    Ct Head Wo Contrast  Result Date: 11/11/2016 CLINICAL DATA:  Altered level of consciousness.  Confusion EXAM: CT HEAD WITHOUT CONTRAST TECHNIQUE: Contiguous axial images were obtained from the base of the skull through the vertex without intravenous contrast. COMPARISON:  CT head 08/13/2016 FINDINGS: Brain:  Moderate atrophy. Chronic microvascular ischemic change in the white matter. Negative for acute infarct, hemorrhage, or mass lesion. No shift of the midline structures. Vascular: Negative for hyperdense vessel. Skull: Negative Sinuses/Orbits: Negative Other: None IMPRESSION: Atrophy and chronic microvascular ischemia.  No acute abnormality. Electronically Signed   By: Franchot Gallo M.D.   On: 11/11/2016 15:26   Dg Chest Port 1 View  Result Date: 11/12/2016 CLINICAL DATA:  Atrial fibrillation. EXAM: PORTABLE CHEST 1 VIEW COMPARISON:  11/11/2016. FINDINGS: Prior CABG. Cardiomegaly with normal pulmonary vascularity . No focal infiltrate . Stable elevation left hemidiaphragm. No pleural effusion or pneumothorax P IMPRESSION: 1. Prior CABG.  Stable cardiomegaly. 2.  Stable elevation left hemidiaphragm . Electronically Signed   By: Marcello Moores  Register   On: 11/12/2016 06:44   Dg Chest Portable 1 View  Result Date: 11/11/2016 CLINICAL DATA:   Altered level of consciousness, patient undergoing sepsis evaluation. History of coronary artery disease, hypertension, former smoker. EXAM: PORTABLE CHEST 1 VIEW COMPARISON:  AP chest x-ray of Aug 13, 2016. FINDINGS: The lungs are adequately inflated and clear. The heart is top-normal in size. The pulmonary vascularity is normal. There is tortuosity of the ascending and descending thoracic aorta with mural calcification. The patient has undergone previous CABG. The observed bony thorax exhibits no acute abnormality. IMPRESSION: There is no acute cardiopulmonary abnormality.  Previous CABG. Thoracic aortic atherosclerosis. Electronically Signed   By: David  Martinique M.D.   On: 11/11/2016 14:50    Cardiac Studies   Study Conclusions  - Left ventricle: The cavity size was moderately dilated. Wall   thickness was normal. Systolic function was low normal. The   estimated ejection fraction was in the range of 50% to 55%.   Inferior wall hypokinesis. Doppler parameters are consistent with   abnormal left ventricular relaxation (grade 1 diastolic   dysfunction). The E/e&' ratio is between 8-15, suggesting   indeterminate LV filling pressure. - Aortic valve: Functionally bicuspid, moderately calcified valve   with restriction of the left coronary cusp. Moderate stenosis.   There was no regurgitation. Mean gradient (S): 14 mm Hg. Peak   gradient (S): 22 mm Hg. Valve area (VTI): 1.22 cm^2. Valve area   (Vmean): 1.18 cm^2. - Mitral valve: Mildly thickened leaflets . There was mild   regurgitation. - Left atrium: The atrium was mildly dilated. - Tricuspid valve: There was mild regurgitation. - Pulmonary arteries: PA peak pressure: 34 mm Hg (S). - Inferior vena cava: The vessel was normal in size. The   respirophasic diameter changes were in the normal range (= 50%),   consistent with normal central venous pressure.  Impressions:  - Compared to a prior study in 2012, the LVEF is lower at 50-55%    with inferior hypokinesis. The LV wall thickness is normal with   moderate dilation of the LV. There is now moderate aortic   stenosis with an AVA of 1.2 cm2.  Patient Profile     83 y.o. male with a hx of CAD s/p remote CABGwho is being seen for the evaluation of elevated troponin in the setting of AKI and urosepsis.  Assessment & Plan    1.  Elevated troponin in the setting of urosepsis, fever, hypotension, tachycardia and acute renal failure.  Per his daughter, he has not complained of any chest pain or SOB prior to getting sick. - Trop trending upward (0.09>0.25>2.49>5.58) -This is likely related to demand ischemia and not ACS although higher than what would usually be expected for  demand ischemia.  Given his advanced age, debilitated state, sepsis, AKI and tachycardia I still suspect this is demand ischemia - check 2D echo  to assess LVF (low normal in Jan 2018) - given advanced age, debilitated state, urosepsis and acute renal failure, he is not a candidate for invasive cardiac procedures at this time and likely not in the future. - continue statin and ASA  2.  ASCAD s/p remote CABG - continue statin and ASA  3.  Chronic LBBB - unchanged from prior EKG  4.  SVT - tele shows several runs of SVT with same QRS morphology as baseline LBBB and therefore likely SVT secondary to his underlying illness.   - tele reviewed today and it appears that he is likely have multifocal atrial tachycardia.  He is only having short bursts at this time.  - BP too soft to add BB and he is wheezing some.   - This is likely being driven by his fever and sepsis and hopefully will improve as acute illness improves.  5.  AKI secondary to urosepsis and fever.   - creatinine about the sam at 2.4 - per TR[facility]  6.  Bicuspid AV with moderate AS by echo 04/2016  Signed, Fransico Him, MD  11/12/2016, 10:33 AM

## 2016-11-12 NOTE — Progress Notes (Signed)
RN paged elevated troponin. Cardiology saw pt in consult earlier for same. Troponin is trending upward. Per cards, this is likely demand ischemia from sepsis and given pt's state of health is not a candidate for invasive cardiac procedures. Pt is on statin and ASA. Pt is NOT complaining of chest pain and is resting quietly at this time. LA elevated and further IVFs given with r/p LA planned for 0300 hrs. KJKG, NP [facility]

## 2016-11-13 LAB — PROCALCITONIN: PROCALCITONIN: 1.59 ng/mL

## 2016-11-13 MED ORDER — B-12 1000 MCG PO TBCR: EXTENDED_RELEASE_TABLET | Freq: Two times a day (BID) | ORAL | Status: DC

## 2016-11-13 MED ORDER — ACETAMINOPHEN 325 MG PO TABS: 650.0000 mg | ORAL_TABLET | Freq: Four times a day (QID) | ORAL | Status: DC | PRN

## 2016-11-13 MED ORDER — SODIUM CHLORIDE 0.45 % IV SOLN
INTRAVENOUS | Status: DC
  Administered 2016-11-13: 22:00:00 via INTRAVENOUS
  Administered 2016-11-13: 100 mL/h via INTRAVENOUS
  Administered 2016-11-14 – 2016-11-15 (×3): via INTRAVENOUS

## 2016-11-13 MED ORDER — MIDODRINE HCL 5 MG PO TABS
5.0000 mg | ORAL_TABLET | ORAL | Status: DC
  Administered 2016-11-13 – 2016-11-20 (×13): 5 mg via ORAL
  Filled 2016-11-13 (×17): qty 1

## 2016-11-13 MED ORDER — ACETAMINOPHEN 650 MG RE SUPP: 650.0000 mg | Freq: Four times a day (QID) | RECTAL | Status: DC | PRN

## 2016-11-13 MED ORDER — DEXTROSE 5 % IV SOLN
2.0000 g | INTRAVENOUS | Status: DC
  Administered 2016-11-13 – 2016-11-16 (×4): 2 g via INTRAVENOUS
  Filled 2016-11-13 (×5): qty 2

## 2016-11-13 MED ORDER — LEVALBUTEROL HCL 1.25 MG/0.5ML IN NEBU
1.2500 mg | INHALATION_SOLUTION | RESPIRATORY_TRACT | Status: DC | PRN
  Administered 2016-11-13 – 2016-11-14 (×2): 1.25 mg via RESPIRATORY_TRACT
  Filled 2016-11-13 (×3): qty 0.5

## 2016-11-13 MED ORDER — VITAMIN B-12 1000 MCG PO TABS
1000.0000 ug | ORAL_TABLET | Freq: Every day | ORAL | Status: DC
  Administered 2016-11-13 – 2016-11-20 (×5): 1000 ug via ORAL
  Filled 2016-11-13 (×7): qty 1

## 2016-11-13 NOTE — Progress Notes (Signed)
[facility] TEAM 1 - Stepdown/ICU TEAM  MEDFORD STAHELI  EVO:[id number] DOB: [birth year] DOA: 11/11/2016 PCP: Burnis Medin, MD    Brief Narrative:  83yo M w/ a hx of Orthostatic Hypotension on Midodrine, Aortic Stenosis, Pulmonary HTN, remote CAD/CABG, arthritis, HLD and Cancer who was sent to the ER after developing progressive confusion (did not recognize family members)in the setting of persistent fevers. He was not speaking but was moving all extremitis. He was incontinent of a large amount of dark colored strong smelling urine.  In the ER chest x-ray was unremarkable, rectal temperature was 299.3, systolic blood pressure in the 90-110 range, room air saturations were 97%. He was found to have acute kidney injury with mild metabolic acidosis, lactic acid was elevated at 2.19, troponin mildly elevated at 0.09 with subtle ST segment changes in leads V2 through V5. He was also found to have mild thrombocytopenia.  CT head unremarkable.  Subjective: Pt is resting comfortably in bed.  He is alert and conversant.  His MS has improved markedly per his wife.  He denies cp, sob, n/v, or abdom pain.    Assessment & Plan:  Severe sepsis due to Escherichia coli UTI w/ bacteremia Cont abx tx - will need a full 14 days of tx given bacteremia   Acute delirium - acute metabolic encephalopathy Most c/w TME in setting of severe sepsis - steadily improving - attempt to avoid sedatives  Thrombocytopenia Due to gram neg rod bacteremia - follow trend - hold lovenox while plt < 100  Acute kidney injury Likely ATN due to severe sepsis - follow creatinine trend   Recent Labs Lab 11/11/16 1333 11/12/16 0740  CREATININE 7.16* 9.67*    Systolic congestive heart failure No clinical evidence on exam of signif volume overload - in fact pt appears to be a bit DH presently - gently hydrate - follow Is/Os and daily weights  Aortic stenosis - bicuspid AoV Will complicated volume management and predispose  pt to quickly shift from [facility] to volume overload - follow Is/Os - exercise care w/ fluids   Coronary artery disease status post CABG - mildly elevated troponin Felt to represent demand ischemia - trop has peaked at 7.64 - not a candidate for aggressive intervention per Cardiology - cont medical tx   Chronic LBBB  Chronic orthostatic hypotension Resume midodrine   Chronic arthritis  Hypokalemia  Replace and follow  HLD  DVT prophylaxis: SCDs Code Status: FULL CODE Family Communication: spoke w/ wife at bedside  Disposition Plan: SDU - begin PT/OT   Consultants:  Cardiology   Procedures: 7/31 TTE - EF 40-45 percent with mild diffuse hypokinesis - mild to moderate aortic stenosis - poor study due to tachycardia and agitation   Antimicrobials:  Aztreonam 7/30 >  Objective: Blood pressure 107/70, pulse 91, temperature 98.3 F (36.8 C), temperature source Oral, resp. rate 19, height 5\' 9"  (1.753 m), weight 77.4 kg (170 lb 10.2 oz), SpO2 94 %.  Intake/Output Summary (Last 24 hours) at 11/13/16 1015 Last data filed at 11/13/16 0600  Gross per 24 hour  Intake             2050 ml  Output             1000 ml  Net             1050 ml   Filed Weights   11/11/16 1500 11/12/16 0300 11/13/16 0500  Weight: 77.1 kg (170 lb) 74.2 kg (163 lb 9.3 oz)  77.4 kg (170 lb 10.2 oz)    Examination: General: No acute respiratory distress at rest - some trouble w/ secretions  Lungs: Clear to auscultation bilaterally without wheezes or crackles Cardiovascular: Regular rate and rhythm without gallop or rub  Abdomen: Nontender, nondistended, soft, bowel sounds positive, no rebound, no ascites, no appreciable mass Extremities: No significant cyanosis, clubbing, or edema bilateral lower extremities  CBC:  Recent Labs Lab 11/11/16 1333 11/12/16 0740  WBC 8.0 6.8  NEUTROABS 7.2  --   HGB 11.8* 10.5*  HCT 35.3* 30.5*  MCV 88.7 88.4  PLT 129* 81*   Basic Metabolic Panel:  Recent Labs Lab  11/11/16 1333 11/12/16 0052 11/12/16 0740  NA 136  --  137  K 4.1  --  3.4*  CL 104  --  114*  CO2 20*  --  14*  GLUCOSE 114*  --  111*  BUN 50*  --  48*  CREATININE 2.44*  --  2.40*  CALCIUM 9.0  --  7.6*  MG  --  1.8  --    GFR: Estimated Creatinine Clearance: 20.9 mL/min (A) (by C-G formula based on SCr of 2.4 mg/dL (H)).  Liver Function Tests:  Recent Labs Lab 11/11/16 1333 11/12/16 0740  AST 31 50*  ALT 15* 18  ALKPHOS 75 72  BILITOT 1.0 0.6  PROT 6.1* 4.8*  ALBUMIN 2.9* 2.2*    Cardiac Enzymes:  Recent Labs Lab 11/11/16 2024 11/12/16 0052 11/12/16 0740 11/12/16 1351 11/12/16 1911  TROPONINI 0.25* 2.49* 5.58* 7.64* 6.32*    HbA1C: Hgb A1c MFr Bld  Date/Time Value Ref Range Status  04/18/2016 07:21 AM 5.1 4.8 - 5.6 % Final    Comment:    (NOTE)         Pre-diabetes: 5.7 - 6.4         Diabetes: >6.4         Glycemic control for adults with diabetes: <7.0     Recent Results (from the past 240 hour(s))  Culture, blood (Routine X 2) w Reflex to ID Panel     Status: Abnormal (Preliminary result)   Collection Time: 11/11/16  2:15 PM  Result Value Ref Range Status   Specimen Description BLOOD LEFT ANTECUBITAL  Final   Special Requests   Final    BOTTLES DRAWN AEROBIC AND ANAEROBIC Blood Culture adequate volume   Culture  Setup Time   Final    GRAM NEGATIVE RODS IN BOTH AEROBIC AND ANAEROBIC BOTTLES CRITICAL RESULT CALLED TO, READ BACK BY AND VERIFIED WITH: Alona Bene PHARMD 0304 11/12/16 A BROWNING    Culture ESCHERICHIA COLI (A)  Final   Report Status PENDING  Incomplete  Blood Culture ID Panel (Reflexed)     Status: Abnormal   Collection Time: 11/11/16  2:15 PM  Result Value Ref Range Status   Enterococcus species NOT DETECTED NOT DETECTED Final   Listeria monocytogenes NOT DETECTED NOT DETECTED Final   Staphylococcus species NOT DETECTED NOT DETECTED Final   Staphylococcus aureus NOT DETECTED NOT DETECTED Final   Streptococcus species NOT  DETECTED NOT DETECTED Final   Streptococcus agalactiae NOT DETECTED NOT DETECTED Final   Streptococcus pneumoniae NOT DETECTED NOT DETECTED Final   Streptococcus pyogenes NOT DETECTED NOT DETECTED Final   Acinetobacter baumannii NOT DETECTED NOT DETECTED Final   Enterobacteriaceae species DETECTED (A) NOT DETECTED Final    Comment: Enterobacteriaceae represent a large family of gram-negative bacteria, not a single organism. CRITICAL RESULT CALLED TO, READ BACK BY AND  VERIFIED WITHAlona Bene PHARMD 0304 11/12/16 A BROWNING    Enterobacter cloacae complex NOT DETECTED NOT DETECTED Final   Escherichia coli DETECTED (A) NOT DETECTED Final    Comment: CRITICAL RESULT CALLED TO, READ BACK BY AND VERIFIED WITH: V BRYK PHARMD 0304 11/12/16 A BROWNING    Klebsiella oxytoca NOT DETECTED NOT DETECTED Final   Klebsiella pneumoniae NOT DETECTED NOT DETECTED Final   Proteus species NOT DETECTED NOT DETECTED Final   Serratia marcescens NOT DETECTED NOT DETECTED Final   Carbapenem resistance NOT DETECTED NOT DETECTED Final   Haemophilus influenzae NOT DETECTED NOT DETECTED Final   Neisseria meningitidis NOT DETECTED NOT DETECTED Final   Pseudomonas aeruginosa NOT DETECTED NOT DETECTED Final   Candida albicans NOT DETECTED NOT DETECTED Final   Candida glabrata NOT DETECTED NOT DETECTED Final   Candida krusei NOT DETECTED NOT DETECTED Final   Candida parapsilosis NOT DETECTED NOT DETECTED Final   Candida tropicalis NOT DETECTED NOT DETECTED Final  Culture, blood (Routine X 2) w Reflex to ID Panel     Status: None (Preliminary result)   Collection Time: 11/11/16  2:58 PM  Result Value Ref Range Status   Specimen Description BLOOD RIGHT ANTECUBITAL  Final   Special Requests   Final    BOTTLES DRAWN AEROBIC AND ANAEROBIC Blood Culture adequate volume   Culture  Setup Time   Final    GRAM NEGATIVE RODS IN BOTH AEROBIC AND ANAEROBIC BOTTLES CRITICAL VALUE NOTED.  VALUE IS CONSISTENT WITH PREVIOUSLY  REPORTED AND CALLED VALUE.    Culture GRAM NEGATIVE RODS  Final   Report Status PENDING  Incomplete  Urine Culture     Status: Abnormal (Preliminary result)   Collection Time: 11/11/16  3:10 PM  Result Value Ref Range Status   Specimen Description URINE, RANDOM  Final   Special Requests NONE  Final   Culture >=100,000 COLONIES/mL ESCHERICHIA COLI (A)  Final   Report Status PENDING  Incomplete  Respiratory Panel by PCR     Status: None   Collection Time: 11/12/16  3:37 AM  Result Value Ref Range Status   Adenovirus NOT DETECTED NOT DETECTED Final   Coronavirus 229E NOT DETECTED NOT DETECTED Final   Coronavirus HKU1 NOT DETECTED NOT DETECTED Final   Coronavirus NL63 NOT DETECTED NOT DETECTED Final   Coronavirus OC43 NOT DETECTED NOT DETECTED Final   Metapneumovirus NOT DETECTED NOT DETECTED Final   Rhinovirus / Enterovirus NOT DETECTED NOT DETECTED Final   Influenza A NOT DETECTED NOT DETECTED Final   Influenza B NOT DETECTED NOT DETECTED Final   Parainfluenza Virus 1 NOT DETECTED NOT DETECTED Final   Parainfluenza Virus 2 NOT DETECTED NOT DETECTED Final   Parainfluenza Virus 3 NOT DETECTED NOT DETECTED Final   Parainfluenza Virus 4 NOT DETECTED NOT DETECTED Final   Respiratory Syncytial Virus NOT DETECTED NOT DETECTED Final   Bordetella pertussis NOT DETECTED NOT DETECTED Final   Chlamydophila pneumoniae NOT DETECTED NOT DETECTED Final   Mycoplasma pneumoniae NOT DETECTED NOT DETECTED Final  MRSA PCR Screening     Status: None   Collection Time: 11/12/16  3:37 AM  Result Value Ref Range Status   MRSA by PCR NEGATIVE NEGATIVE Final    Comment:        The GeneXpert MRSA Assay (FDA approved for NASAL specimens only), is one component of a comprehensive MRSA colonization surveillance program. It is not intended to diagnose MRSA infection nor to guide or monitor treatment for MRSA infections.  Scheduled Meds: . chlorhexidine  15 mL Mouth Rinse BID  . enoxaparin  (LOVENOX) injection  30 mg Subcutaneous Q24H  . levalbuterol  1.25 mg Nebulization Q6H  . mouth rinse  15 mL Mouth Rinse q12n4p  . methylPREDNISolone (SOLU-MEDROL) injection  60 mg Intravenous Q12H  . sodium chloride flush  3 mL Intravenous Q12H     LOS: 2 days   Cherene Altes, MD [facility] Office  [phone] Pager - Text Page per Shea Evans as per below:  On-Call/Text Page:      [email or web address]      password [email or web address]  If 7PM-7AM, please contact night-coverage [email or web address] Password [email or web address] 11/13/2016, 10:15 AM

## 2016-11-13 NOTE — Progress Notes (Signed)
Progress Note  Patient Name: Austin Rose Date of Encounter: 11/13/2016  Primary Cardiologist: Dr. Claiborne Billings   Subjective   Pt looks more comfortable today, answering questions  Inpatient Medications    Scheduled Meds: . chlorhexidine  15 mL Mouth Rinse BID  . enoxaparin (LOVENOX) injection  30 mg Subcutaneous Q24H  . levalbuterol  1.25 mg Nebulization Q6H  . mouth rinse  15 mL Mouth Rinse q12n4p  . methylPREDNISolone (SOLU-MEDROL) injection  60 mg Intravenous Q12H  . sodium chloride flush  3 mL Intravenous Q12H   Continuous Infusions: . sodium chloride 100 mL/hr at 11/13/16 0300  . aztreonam 1 g (11/13/16 0556)  . levofloxacin (LEVAQUIN) IV    . vancomycin     PRN Meds: acetaminophen **OR** acetaminophen, ondansetron **OR** ondansetron (ZOFRAN) IV   Vital Signs    Vitals:   11/13/16 0400 11/13/16 0500 11/13/16 0715 11/13/16 0800  BP: 104/82   107/70  Pulse:    91  Resp:    19  Temp:      TempSrc:      SpO2:   96% 94%  Weight:  170 lb 10.2 oz (77.4 kg)    Height:        Intake/Output Summary (Last 24 hours) at 11/13/16 1041 Last data filed at 11/13/16 1020  Gross per 24 hour  Intake             2050 ml  Output             1275 ml  Net              775 ml   Filed Weights   11/11/16 1500 11/12/16 0300 11/13/16 0500  Weight: 170 lb (77.1 kg) 163 lb 9.3 oz (74.2 kg) 170 lb 10.2 oz (77.4 kg)     Physical Exam   General: Well developed, well nourished, male appearing in no acute distress. Head: Normocephalic, atraumatic.  Neck: Supple without bruits, no JVD Lungs:  Resp regular and unlabored, CTA. Heart: Irregular rhythm, regular rate, S1, S2, no S3, S4, or murmur; no rub. Abdomen: Soft, non-tender, non-distended with normoactive bowel sounds. No hepatomegaly. No rebound/guarding. No obvious abdominal masses. Extremities: No clubbing, cyanosis, no edema. Distal pedal pulses are 2+ bilaterally. Neuro: Alert and oriented X 3. Moves all extremities  spontaneously. Psych: Normal affect.  Labs    Chemistry Recent Labs Lab 11/11/16 1333 11/12/16 0740  NA 136 137  K 4.1 3.4*  CL 104 114*  CO2 20* 14*  GLUCOSE 114* 111*  BUN 50* 48*  CREATININE 2.44* 2.40*  CALCIUM 9.0 7.6*  PROT 6.1* 4.8*  ALBUMIN 2.9* 2.2*  AST 31 50*  ALT 15* 18  ALKPHOS 75 72  BILITOT 1.0 0.6  GFRNONAA 22* 22*  GFRAA 25* 26*  ANIONGAP 12 9     Hematology Recent Labs Lab 11/11/16 1333 11/12/16 0740  WBC 8.0 6.8  RBC 3.98* 3.45*  HGB 11.8* 10.5*  HCT 35.3* 30.5*  MCV 88.7 88.4  MCH 29.6 30.4  MCHC 33.4 34.4  RDW 14.6 14.7  PLT 129* 81*    Cardiac Enzymes Recent Labs Lab 11/12/16 0052 11/12/16 0740 11/12/16 1351 11/12/16 1911  TROPONINI 2.49* 5.58* 7.64* 6.32*   No results for input(s): TROPIPOC in the last 168 hours.   BNPNo results for input(s): BNP, PROBNP in the last 168 hours.   DDimer No results for input(s): DDIMER in the last 168 hours.   Radiology    Ct Head Wo Contrast  Result Date: 11/11/2016 CLINICAL DATA:  Altered level of consciousness.  Confusion EXAM: CT HEAD WITHOUT CONTRAST TECHNIQUE: Contiguous axial images were obtained from the base of the skull through the vertex without intravenous contrast. COMPARISON:  CT head 08/13/2016 FINDINGS: Brain: Moderate atrophy. Chronic microvascular ischemic change in the white matter. Negative for acute infarct, hemorrhage, or mass lesion. No shift of the midline structures. Vascular: Negative for hyperdense vessel. Skull: Negative Sinuses/Orbits: Negative Other: None IMPRESSION: Atrophy and chronic microvascular ischemia.  No acute abnormality. Electronically Signed   By: Franchot Gallo M.D.   On: 11/11/2016 15:26   Dg Chest Port 1 View  Result Date: 11/12/2016 CLINICAL DATA:  Atrial fibrillation. EXAM: PORTABLE CHEST 1 VIEW COMPARISON:  11/11/2016. FINDINGS: Prior CABG. Cardiomegaly with normal pulmonary vascularity . No focal infiltrate . Stable elevation left hemidiaphragm.  No pleural effusion or pneumothorax P IMPRESSION: 1. Prior CABG.  Stable cardiomegaly. 2.  Stable elevation left hemidiaphragm . Electronically Signed   By: Marcello Moores  Register   On: 11/12/2016 06:44   Dg Chest Portable 1 View  Result Date: 11/11/2016 CLINICAL DATA:  Altered level of consciousness, patient undergoing sepsis evaluation. History of coronary artery disease, hypertension, former smoker. EXAM: PORTABLE CHEST 1 VIEW COMPARISON:  AP chest x-ray of Aug 13, 2016. FINDINGS: The lungs are adequately inflated and clear. The heart is top-normal in size. The pulmonary vascularity is normal. There is tortuosity of the ascending and descending thoracic aorta with mural calcification. The patient has undergone previous CABG. The observed bony thorax exhibits no acute abnormality. IMPRESSION: There is no acute cardiopulmonary abnormality.  Previous CABG. Thoracic aortic atherosclerosis. Electronically Signed   By: David  Martinique M.D.   On: 11/11/2016 14:50     Telemetry    NSR with frequent PACs with bouts of MAT - Personally Reviewed  ECG    No new tracings - Personally Reviewed   Cardiac Studies   Echocardiogram 11/12/16: Study Conclusions - Left ventricle: The cavity size was mildly dilated. Wall   thickness was normal. Systolic function was mildly to moderately   reduced. The estimated ejection fraction was in the range of 40%   to 45%. Mild diffuse hypokinesis with distinct regional wall   motion abnormalities. Possible disproprotionately severe   hypokinesis of the inferior myocardium. The study is not   technically sufficient to allow evaluation of LV diastolic   function. - Aortic valve: Left coronary cusp mobility was moderately   restricted. Transvalvular velocity was increased less than   expected, due to low cardiac output. There was mild to moderate   stenosis. Valve area (VTI): 1.27 cm^2. - Mitral valve: There was mild regurgitation. - Left atrium: The atrium was moderately  dilated. - Right ventricle: The cavity size was mildly dilated. Systolic   function was reduced. - Right atrium: The atrium was moderately dilated.  Impressions: - Very difficult study due to incessant atrial arrhythmia and   agitated patient. The study should be repeated when the patient   is able to cooperate and Definity contrast should be used for   improved wall motion analysis.  Patient Profile     83 y.o. male with a hx of CAD s/p remote CABGwho is being seen for the evaluation of elevated troponinin the setting of AKI and urosepsis.  Assessment & Plan    1. Elevated troponin in the setting of urosepsis, fever, hypotension, tachycardia and acute renal failure. Per his daughter, he has not complained of any chest pain or SOB prior to  getting sick. - Troponins:  0.09>0.25>2.49>5.58>7.64>6.32 -This is likely related to demand ischemia and not ACS although higher than what would usually be expected for demand ischemia. Given his advanced age, debilitated state, sepsis, AKI and tachycardia I still suspect this is demand ischemia. Troponin has peaked and trending downward - echocardiogram with mildly reduced LV function at 40-45% but very poor study due to pt agitation and tachycardia.  Will repeat limited study with definity once patient more cooperative. - given advanced age, debilitated state, urosepsis and acute renal failure, he is not a candidate for invasive cardiac procedures at this time and likely not in the future. - continue statin and ASA  2. ASCAD s/p remote CABG - continue statin and ASA  3. Chronic LBBB - unchanged from prior EKG  4. SVT - tele showed several runs of SVT with same QRS morphology as baseline LBBB and therefore likely SVT secondary to his underlying illness.  - tele reviewed today and it appears that he is likely have multifocal atrial tachycardia.  He is only having short bursts of SVT since yesterday -BP too soft to add BB and he is wheezing  some.  - This is likely being driven by his fever and sepsis and hopefully will improve as acute illness improves. - will consider toprol if pressure allows tomorrow  5. AKI secondary to urosepsis and fever.  - creatinine stable, labs pending today - per TR[facility]  6. Bicuspid AV with moderate AS by echo 04/2016  Signed, Tami Lin Duke , PA-C 10:41 AM 11/13/2016 Pager: [phone]  Patient seen and independently examined with Fabian Sharp, PA. We discussed all aspects of the encounter. I agree with the assessment and plan as stated above.  Patient much improved from yesterday.  He knows who he is and where he is and able to carry on a conversation.  He denies any chest pain now or in recent weeks.  HR improved but still having, what looks like, multifocal atrial rhythm and MAT.  Could be PAF but I think most of the time you can see P waves of varying morphologies.  Would hold on addition of BB yet as his BP is still somewhat soft.  2D echo with mild LV dysfunction but very poor study done during tachycardia and patient agitation.  Once patient completely cooperative will get a limited echo with definity.    Signed: Fransico Him, MD [facility] 11/13/2016

## 2016-11-13 NOTE — Care Management Note (Addendum)
Case Management Note  Patient Details  Name: Austin Rose MRN: [id number] Date of Birth: 08-23-1925  Subjective/Objective:   From home with wife, son and daughter at bedside, they both work, he has 2 rolling walker and several canes , he has a PCP and medication coverage.  He has had [place] services with [name] in the past and if needs [facility] services would prefer [place]. He presents with Sepsis, afib (new), aki, delirum, metabolic enceph, thrombocytopenia.  Await pt eval. PT eval rec SNF, CSW referral.              Action/Plan: NCM will follow along with CSW for dc needs.  Expected Discharge Date:                  Expected Discharge Plan:     In-House Referral:     Discharge planning Services  CM Consult  Post Acute Care Choice:    Choice offered to:     DME Arranged:    DME Agency:     HH Arranged:    HH Agency:     Status of Service:  In process, will continue to follow  If discussed at Long Length of Stay Meetings, dates discussed:    Additional Comments:  Zenon Mayo, RN 11/13/2016, 5:16 PM

## 2016-11-13 NOTE — Plan of Care (Signed)
Problem: Fluid Volume: Goal: Ability to maintain a balanced intake and output will improve Outcome: Progressing Patient placed on a clear liquid diet today.  Tolerating well.

## 2016-11-14 DIAGNOSIS — E876 Hypokalemia: Secondary | ICD-10-CM

## 2016-11-14 LAB — COMPREHENSIVE METABOLIC PANEL
ALT: 33 U/L (ref 17–63)
AST: 58 U/L — ABNORMAL HIGH (ref 15–41)
Albumin: 2.2 g/dL — ABNORMAL LOW (ref 3.5–5.0)
Alkaline Phosphatase: 59 U/L (ref 38–126)
Anion gap: 8 (ref 5–15)
BUN: 57 mg/dL — ABNORMAL HIGH (ref 6–20)
CHLORIDE: 119 mmol/L — AB (ref 101–111)
CO2: 13 mmol/L — AB (ref 22–32)
CREATININE: 1.73 mg/dL — AB (ref 0.61–1.24)
Calcium: 8.3 mg/dL — ABNORMAL LOW (ref 8.9–10.3)
GFR, EST AFRICAN AMERICAN: 39 mL/min — AB (ref >60)
GFR, EST NON AFRICAN AMERICAN: 33 mL/min — AB (ref >60)
Glucose, Bld: 138 mg/dL — ABNORMAL HIGH (ref 65–99)
POTASSIUM: 3.2 mmol/L — AB (ref 3.5–5.1)
SODIUM: 140 mmol/L (ref 135–145)
Total Bilirubin: 0.4 mg/dL (ref 0.3–1.2)
Total Protein: 5.1 g/dL — ABNORMAL LOW (ref 6.5–8.1)

## 2016-11-14 LAB — CULTURE, BLOOD (ROUTINE X 2)
SPECIAL REQUESTS: ADEQUATE
Special Requests: ADEQUATE

## 2016-11-14 LAB — MAGNESIUM
Magnesium: 2.1 mg/dL (ref 1.7–2.4)
Magnesium: 2.1 mg/dL (ref 1.7–2.4)

## 2016-11-14 LAB — CBC
HEMATOCRIT: 30.5 % — AB (ref 39.0–52.0)
HEMOGLOBIN: 10.3 g/dL — AB (ref 13.0–17.0)
MCH: 29.2 pg (ref 26.0–34.0)
MCHC: 33.8 g/dL (ref 30.0–36.0)
MCV: 86.4 fL (ref 78.0–100.0)
Platelets: 155 10*3/uL (ref 150–400)
RBC: 3.53 MIL/uL — AB (ref 4.22–5.81)
RDW: 15.1 % (ref 11.5–15.5)
WBC: 12 10*3/uL — AB (ref 4.0–10.5)

## 2016-11-14 LAB — URINE CULTURE: Culture: >=100000 — AB

## 2016-11-14 LAB — POTASSIUM
POTASSIUM: 3.6 mmol/L (ref 3.5–5.1)
Potassium: 3.1 mmol/L — ABNORMAL LOW (ref 3.5–5.1)

## 2016-11-14 MED ORDER — POTASSIUM CHLORIDE 20 MEQ/15ML (10%) PO SOLN
50.0000 meq | Freq: Once | ORAL | Status: AC
  Administered 2016-11-14: 50 meq via ORAL
  Filled 2016-11-14: qty 45

## 2016-11-14 MED ORDER — ZOLPIDEM TARTRATE 5 MG PO TABS
5.0000 mg | ORAL_TABLET | Freq: Every evening | ORAL | Status: DC | PRN
  Administered 2016-11-14: 5 mg via ORAL
  Filled 2016-11-14: qty 1

## 2016-11-14 MED ORDER — LEVALBUTEROL HCL 1.25 MG/0.5ML IN NEBU
1.2500 mg | INHALATION_SOLUTION | Freq: Three times a day (TID) | RESPIRATORY_TRACT | Status: DC
  Administered 2016-11-14 – 2016-11-16 (×7): 1.25 mg via RESPIRATORY_TRACT
  Filled 2016-11-14 (×7): qty 0.5

## 2016-11-14 MED ORDER — METOPROLOL TARTRATE 12.5 MG HALF TABLET
12.5000 mg | ORAL_TABLET | Freq: Two times a day (BID) | ORAL | Status: DC
  Administered 2016-11-14: 12.5 mg via ORAL
  Filled 2016-11-14 (×2): qty 1

## 2016-11-14 NOTE — Progress Notes (Signed)
Progress Note  Patient Name: Austin Rose Date of Encounter: 11/14/2016  Primary Cardiologist: Dr. Claiborne Billings  Subjective   No complaints at present.  Sitting up in chair  Inpatient Medications    Scheduled Meds: . chlorhexidine  15 mL Mouth Rinse BID  . levalbuterol  1.25 mg Nebulization TID  . mouth rinse  15 mL Mouth Rinse q12n4p  . midodrine  5 mg Oral 3 times per day  . sodium chloride flush  3 mL Intravenous Q12H  . vitamin B-12  1,000 mcg Oral Daily   Continuous Infusions: . sodium chloride 100 mL/hr at 11/14/16 0739  . cefTRIAXone (ROCEPHIN)  IV Stopped (11/13/16 1639)   PRN Meds: acetaminophen **OR** acetaminophen, levalbuterol, ondansetron **OR** ondansetron (ZOFRAN) IV   Vital Signs    Vitals:   11/14/16 0300 11/14/16 0420 11/14/16 0505 11/14/16 0719  BP:  125/84  115/89  Pulse:  (!) 102  (!) 125  Resp:  17  19  Temp:  (!) 97.4 F (36.3 C) 98 F (36.7 C) 97.7 F (36.5 C)  TempSrc:  Axillary Oral Oral  SpO2:  99%  98%  Weight: 174 lb 6.1 oz (79.1 kg)     Height:        Intake/Output Summary (Last 24 hours) at 11/14/16 1210 Last data filed at 11/14/16 0900  Gross per 24 hour  Intake             2240 ml  Output              500 ml  Net             1740 ml   Filed Weights   11/12/16 0300 11/13/16 0500 11/14/16 0300  Weight: 163 lb 9.3 oz (74.2 kg) 170 lb 10.2 oz (77.4 kg) 174 lb 6.1 oz (79.1 kg)    Telemetry    NSR with short bursts of nonsustained atrial tachy - Personally Reviewed  ECG    No new EKG to review - Personally Reviewed  Physical Exam   GEN: No acute distress.   Neck: No JVD Cardiac: RRR, no murmurs, rubs, or gallops.  Respiratory: Clear to auscultation bilaterally. GI: Soft, nontender, non-distended  MS: No edema; No deformity. Neuro:  Nonfocal  Psych: Normal affect   Labs    Chemistry Recent Labs Lab 11/11/16 1333 11/12/16 0740 11/14/16 0631  NA 136 137 140  K 4.1 3.4* 3.2*  CL 104 114* 119*  CO2 20* 14* 13*   GLUCOSE 114* 111* 138*  BUN 50* 48* 57*  CREATININE 2.44* 2.40* 1.73*  CALCIUM 9.0 7.6* 8.3*  PROT 6.1* 4.8* 5.1*  ALBUMIN 2.9* 2.2* 2.2*  AST 31 50* 58*  ALT 15* 18 33  ALKPHOS 75 72 59  BILITOT 1.0 0.6 0.4  GFRNONAA 22* 22* 33*  GFRAA 25* 26* 39*  ANIONGAP 12 9 8      Hematology Recent Labs Lab 11/11/16 1333 11/12/16 0740 11/14/16 0631  WBC 8.0 6.8 12.0*  RBC 3.98* 3.45* 3.53*  HGB 11.8* 10.5* 10.3*  HCT 35.3* 30.5* 30.5*  MCV 88.7 88.4 86.4  MCH 29.6 30.4 29.2  MCHC 33.4 34.4 33.8  RDW 14.6 14.7 15.1  PLT 129* 81* 155    Cardiac Enzymes Recent Labs Lab 11/12/16 0052 11/12/16 0740 11/12/16 1351 11/12/16 1911  TROPONINI 2.49* 5.58* 7.64* 6.32*   No results for input(s): TROPIPOC in the last 168 hours.   BNPNo results for input(s): BNP, PROBNP in the last 168 hours.  DDimer No results for input(s): DDIMER in the last 168 hours.   Radiology    No results found.  Cardiac Studies   Study Conclusions  - Left ventricle: The cavity size was moderately dilated. Wall thickness was normal. Systolic function was low normal. The estimated ejection fraction was in the range of 50% to 55%. Inferior wall hypokinesis. Doppler parameters are consistent with abnormal left ventricular relaxation (grade 1 diastolic dysfunction). The E/e&' ratio is between 8-15, suggesting indeterminate LV filling pressure. - Aortic valve: Functionally bicuspid, moderately calcified valve with restriction of the left coronary cusp. Moderate stenosis. There was no regurgitation. Mean gradient (S): 14 mm Hg. Peak gradient (S): 22 mm Hg. Valve area (VTI): 1.22 cm^2. Valve area (Vmean): 1.18 cm^2. - Mitral valve: Mildly thickened leaflets . There was mild regurgitation. - Left atrium: The atrium was mildly dilated. - Tricuspid valve: There was mild regurgitation. - Pulmonary arteries: PA peak pressure: 34 mm Hg (S). - Inferior vena cava: The vessel was normal in  size. The respirophasic diameter changes were in the normal range (= 50%), consistent with normal central venous pressure.  Impressions:  - Compared to a prior study in 2012, the LVEF is lower at 50-55% with inferior hypokinesis. The LV wall thickness is normal with moderate dilation of the LV. There is now moderate aortic stenosis with an AVA of 1.2 cm2.  Patient Profile     83 y.o. male with a hx of CAD s/p remote CABGwho is being seen for the evaluation of elevated troponinin the setting of AKI and urosepsis.  Assessment & Plan    1. Elevated troponin in the setting of urosepsis, fever, hypotension, tachycardia and acute renal failure. Per his daughter, he has not complained of any chest pain or SOB prior to getting sick.  - Trop trending upward (0.09>0.25>2.49>5.58>7.64>6.32) -This is likely related to demand ischemia and not ACS although higher than what would usually be expected for demand ischemia. Given his advanced age, debilitated state, sepsis, AKI and tachycardia I still suspect this is demand ischemia - 2D echo showed EF 40-45% with inferior HK which is new from prior echo in January - given advanced age, debilitated state, urosepsis and acute renal failure, he is not a candidate for invasive cardiac procedures at this time and likely not in the future. - continue statin and ASA  2. ASCAD s/p remote CABG - continue statin and ASA  3. Chronic LBBB - unchanged from prior EKG  4. SVT - tele shows several runs of SVT with same QRS morphology as baseline LBBB and therefore likely SVT secondary to his underlying illness.  - This is likely being driven by his fever and sepsis and hopefully will improve as acute illness improves. - still having frequent runs of nonsustained atrial tachycardia (?MAT). - given reduced LVF would avoid CCB - will add very low dose metoprolol 12.5mg  BID for suppression - no obvious Afib noted  5. AKI secondary to urosepsis  and fever.  - creatinine improved from 2.44>1.73 today - per [facility]  6. Bicuspid AV with moderate AS by echo 04/2016 with no change by echo 10/2016 by gradient and AVA.  7.  Hypokalemia  - replete per [facility]  Signed, Fransico Him, MD  11/14/2016, 12:10 PM

## 2016-11-14 NOTE — Evaluation (Signed)
Physical Therapy Evaluation Patient Details Name: Austin Rose MRN: [id number] DOB: 1925/04/27 Today's Date: 11/14/2016   History of Present Illness  Patient is a 83 y/o male who was admitted with urosepsis, fever, hypotension, tachycardia and acute renal failure. + UTI. PMH includes HTN, HLD, CAD with CABG, orthostatic hypotension, back surgery, TIA.  Clinical Impression  Patient presents with lethargy, dizziness, decreased attention, impaired problem solving, impaired balance and decreased mobility s/p above. Tolerated standing and SPT to chair with Min-Mod A for balance/safety. Requires constant cues to stay attended to task. Pt easily distracted by lines. Dizziness limiting session. Pt independent PTA and still works in the garden. Wife reports pt takes medicine for dizziness 3x/day to keep symptoms at [place]. RN notified of need for this medication while in the hospital. Would benefit from SNF to maximize independence and mobility prior to return home. If pt clears and progresses well with mobility, may be able to consider home with HHPT. Will follow acutely.    Follow Up Recommendations SNF;Supervision for mobility/OOB;Supervision/Assistance - 24 hour    Equipment Recommendations  None recommended by PT    Recommendations for Other Services       Precautions / Restrictions Precautions Precautions: Fall Precaution Comments: hx of vertigo (takes medicine 3x/day at home) RN notified of this. Restrictions Weight Bearing Restrictions: No      Mobility  Bed Mobility Overal bed mobility: Needs Assistance Bed Mobility: Supine to Sit     Supine to sit: Max assist;HOB elevated     General bed mobility comments: Assist for LEs, elevate trunk and scoot bottom to EOB. Difficulty sequencing and initiating movement despite cues. + dizziness.  Transfers Overall transfer level: Needs assistance Equipment used: Rolling walker (2 wheeled) Transfers: Sit to/from [employer] Sit to Stand: Mod assist Stand pivot transfers: Min assist       General transfer comment: Assist to power to standing with cues through hips. Pt not able to get fully upright. + dizziness. Leaning on RW for support. Cues to keep eyes open and for gaze stabilization. SPT to get to chair with assist for RW negotiation and directional cues.   Ambulation/Gait             General Gait Details: deferred due to lethargy and dizziness.  Stairs            Wheelchair Mobility    Modified Rankin (Stroke Patients Only)       Balance Overall balance assessment: Needs assistance Sitting-balance support: Feet supported;Single extremity supported Sitting balance-Leahy Scale: Fair Sitting balance - Comments: Requires UE support sitting EOB.   Standing balance support: During functional activity;Bilateral upper extremity supported Standing balance-Leahy Scale: Poor Standing balance comment: Reliant on BUEs for support in standing. + dizziness.                             Pertinent Vitals/Pain Pain Assessment: No/denies pain    Home Living Family/patient expects to be discharged to:: Private residence Living Arrangements: Spouse/significant other Available Help at Discharge: Family;Available 24 hours/day Type of Home: House Home Access: Ramped entrance     Home Layout: One level Home Equipment: Walker - 2 wheels;Shower seat;Grab bars - tub/shower;Walker - 4 wheels;Cane - single point;Hand held shower head      Prior Function Level of Independence: Independent with assistive device(s)         Comments: Drives. 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  Hand Dominance   Dominant Hand: Right    Extremity/Trunk Assessment   Upper Extremity Assessment Upper Extremity Assessment: Defer to OT evaluation    Lower Extremity Assessment Lower Extremity Assessment: Generalized weakness (Able to bear weight through BLEs without knees  buckling.)    Cervical / Trunk Assessment Cervical / Trunk Assessment: Kyphotic  Communication   Communication: No difficulties  Cognition Arousal/Alertness: Lethargic Behavior During Therapy: Flat affect Overall Cognitive Status: Impaired/Different from baseline Area of Impairment: Orientation;Attention;Following commands;Problem solving                 Orientation Level: Disoriented to;Time;Situation ("April") Current Attention Level: Focused   Following Commands: Follows one step commands with increased time;Follows one step commands inconsistently (repetition and constant cues)     Problem Solving: Slow processing;Decreased initiation;Difficulty sequencing;Requires verbal cues;Requires tactile cues General Comments: very distracted by all lines/leads. Needs cues to stay attended to task and keep eyes open. Confused with responses to questions. Incontinent of stool during transfer. Difficulty problem solving and sequencing tasks.       General Comments General comments (skin integrity, edema, etc.): Supine BP 121/80, Sitting BP 109/77, post standing BP 115/90.     Exercises     Assessment/Plan    PT Assessment Patient needs continued PT services  PT Problem List Decreased strength;Decreased mobility;Decreased balance;Decreased cognition;Decreased activity tolerance;Decreased safety awareness       PT Treatment Interventions Therapeutic activities;Gait training;Therapeutic exercise;Patient/family education;Balance training;Functional mobility training    PT Goals (Current goals can be found in the Care Plan section)  Acute Rehab PT Goals Patient Stated Goal: per wife to come home PT Goal Formulation: With family Time For Goal Achievement: 11/28/16 Potential to Achieve Goals: Fair    Frequency Min 3X/week   Barriers to discharge        Co-evaluation PT/OT/SLP Co-Evaluation/Treatment: Yes Reason for Co-Treatment: For patient/therapist safety;Necessary to  address cognition/behavior during functional activity;To address functional/ADL transfers PT goals addressed during session: Mobility/safety with mobility;Balance;Strengthening/ROM         AM-PAC PT "6 Clicks" Daily Activity  Outcome Measure Difficulty turning over in bed (including adjusting bedclothes, sheets and blankets)?: Total Difficulty moving from lying on back to sitting on the side of the bed? : Total Difficulty sitting down on and standing up from a chair with arms (e.g., wheelchair, bedside commode, etc,.)?: Total Help needed moving to and from a bed to chair (including a wheelchair)?: A Little Help needed walking in hospital room?: A Lot Help needed climbing 3-5 steps with a railing? : Total 6 Click Score: 9    End of Session Equipment Utilized During Treatment: Gait belt Activity Tolerance: Patient limited by lethargy Patient left: in chair;with call bell/phone within reach;with family/visitor present Nurse Communication: Mobility status PT Visit Diagnosis: Dizziness and giddiness (R42);Unsteadiness on feet (R26.81);Muscle weakness (generalized) (M62.81);Difficulty in walking, not elsewhere classified (R26.2)    Time: 8588-5027 PT Time Calculation (min) (ACUTE ONLY): 34 min   Charges:   PT Evaluation $PT Eval Moderate Complexity: 1 Mod     PT G CodesWray Kearns, PT, DPT [phone]    Marguarite Arbour A Virginie Josten 11/14/2016, 1:57 PM

## 2016-11-14 NOTE — Progress Notes (Signed)
PROGRESS NOTE    Austin Rose  FWY:[id number] DOB: [birth year] DOA: 11/11/2016 PCP: Burnis Medin, MD   Brief Narrative:  83 y.o. WM PMHx Orthostatic Hypotension on Midodrine, [facility],  Aortic Stenosis with Pulmonary HTN, remote CAD/CABG 17 years prior, arthritis and HLD Cancer .   Patient sent to the ER after developing progressive confusion (did not recognize family members) in the setting of persistent fevers that began this past Friday. By this morning he was not speaking although he was moving all extremities 4. He was also incontinent of a large amount of urine. Family reports that urine is dark colored and very strong smelling. In the ER chest x-ray was unremarkable, rectal temperature was 277.4, systolic blood pressure in the 90-110 range, room air saturations were 97%. He was found to have acute kidney injury with mild metabolic acidosis, lactic acid was elevated at 2.19, troponin mildly elevated at 0.09 with subtle ST segment changes in leads V2 through V5. He was also found to have mild thrombocytopenia. Urinalysis and culture pending. CT head unremarkable. Patient met sepsis criteria and therefore given suboptimal blood pressures and elevated lactic acid EDP opted to give patient 30 mL/kg bolus saline and has requested hospitalist team admit patient.   Subjective: 8/2 A/O 4, recognizes his daughter, negative CP, negative SOB, negative abdominal pain. Much more alert and interactive    Assessment & Plan:   Principal Problem:   Sepsis ([place]) Active Problems:   Acute kidney injury ([place])   CAD (coronary artery disease)   Arthritis   Elevated troponin   Thrombocytopenia, idiopathic ([facility])   Orthostatic hypotension   Acute metabolic encephalopathy   Aortic stenosis, moderate w/ mild pulmonary HTN   Hyperlipemia   Pressure injury of skin   Severe sepsis/Escherichia coli Bacteremia  -Upon admission patient meets criteria for sepsis: Temp> 30C,  HR> 90, RR> 20. Positive lactic acidosis, positive altered mental status  -Lactic acid normalized -Normal saline 100 ml/hr  -Continue antibiotics for at least 2 weeks  -Antibiotics narrowed  Pneumonia? -Respiratory virus panel negative -Patient with continued expiratory wheezing though improved -Xopenex TID   Acute renal failure (baseline Cr 1.15) Lab Results  Component Value Date   CREATININE 1.73 (H) 11/14/2016   CREATININE 2.40 (H) 11/12/2016   CREATININE 2.44 (H) 11/11/2016  -improving with hydration  Acute systolic CHF/Aortic stenosis/SVT,  -Echocardiogram: See results below -Consistent with acute cardiac event. Cardiology following  -per cardiology avoid CCB -Metoprolol 12.5 mg BID - Elevated troponin/CAD -Troponins appear to a peak at 7.64, continue to follow  Recent Labs Lab 11/11/16 1333 11/11/16 2024 11/12/16 0052 11/12/16 0740 11/12/16 1351 11/12/16 1911  TROPONINI 0.09* 0.25* 2.49* 5.58* 7.64* 6.32*  -Given patient's advanced age, multiple medical problems would not be candidate for invasive procedure. .   Orthostatic hypotension -[facility] -Midodrin 5 mg TID  Acute metabolic encephalopathy -Cognition clearing will request a swallow study    Thrombocytopenia, idiopathic  -Platelets trending up. Continue to follow -side current sign of overt bleeding.  -Follow labs    Arthritis -History of altered mentation secondary to narcotic pain medications in the past 12 months -Has associated neuropathy therefore on Neurontin at home -Home trazodone on hold secondary to altered mentation    Hyperlipemia -Currently holding home Zocor secondary to altered mentation  Hypokalemia -Potassium goal> 4 -Potassium 50 mEq -Recheck K/Mg at  2030     DVT prophylaxis: Lovenox Code Status: Full Family Communication: Wife  at bedside Disposition Plan: TBD   Consultants:  Cardiology  Procedures/Significant Events:  7/31  Echocardiogram : LVEF=: 40% to 45%. Mild diffuse hypokinesis with distinct regional wall   motion abnormalities. Possible disproprotionately severe hypokinesis of the inferior myocardium.  - Aortic valve: mild to moderate - Left atrium: moderately dilated. - Right atrium: moderately dilated.    VENTILATOR SETTINGS: None   Cultures 7/30 blood positive Escherichia coli 7/30 urine pending 7/31 MRSA by PCR negative 7/31 Respiratory virus panel negative     Antimicrobials:Anti-infectives    Start     Stop   11/13/16 1600  vancomycin (VANCOCIN) IVPB 1000 mg/200 mL premix  Status:  Discontinued     11/13/16 1048   11/13/16 1500  levofloxacin (LEVAQUIN) IVPB 500 mg  Status:  Discontinued     11/13/16 1048   11/13/16 1400  cefTRIAXone (ROCEPHIN) 2 g in dextrose 5 % 50 mL IVPB         11/12/16 0015  levofloxacin (LEVAQUIN) IVPB 750 mg  Status:  Discontinued     11/12/16 0252   11/12/16 0015  aztreonam (AZACTAM) 2 g in dextrose 5 % 50 mL IVPB  Status:  Discontinued     11/12/16 0253   11/12/16 0015  vancomycin (VANCOCIN) IVPB 1000 mg/200 mL premix  Status:  Discontinued     11/12/16 0254   11/11/16 2200  aztreonam (AZACTAM) 1 g in dextrose 5 % 50 mL IVPB  Status:  Discontinued     11/13/16 1130   11/11/16 1500  levofloxacin (LEVAQUIN) IVPB 750 mg     11/11/16 1652   11/11/16 1500  aztreonam (AZACTAM) 2 g in dextrose 5 % 50 mL IVPB     11/11/16 1653   11/11/16 1500  vancomycin (VANCOCIN) IVPB 1000 mg/200 mL premix     11/11/16 1754       Devices    LINES / TUBES:  None    Continuous Infusions: . sodium chloride 100 mL/hr at 11/14/16 0739  . cefTRIAXone (ROCEPHIN)  IV Stopped (11/13/16 1639)     Objective: Vitals:   11/14/16 0300 11/14/16 0420 11/14/16 0505 11/14/16 0719  BP:  125/84  115/89  Pulse:  (!) 102  (!) 125  Resp:  17  19  Temp:  (!) 97.4 F (36.3 C) 98 F (36.7 C) 97.7 F (36.5 C)  TempSrc:  Axillary Oral Oral  SpO2:  99%  98%  Weight: 174 lb  6.1 oz (79.1 kg)     Height:        Intake/Output Summary (Last 24 hours) at 11/14/16 0906 Last data filed at 11/14/16 0600  Gross per 24 hour  Intake             2880 ml  Output              775 ml  Net             2105 ml   Filed Weights   11/12/16 0300 11/13/16 0500 11/14/16 0300  Weight: 163 lb 9.3 oz (74.2 kg) 170 lb 10.2 oz (77.4 kg) 174 lb 6.1 oz (79.1 kg)    Physical Exam:  General: A/O 4,No acute respiratory distress, follows commands  ENT:  Mucous membranes still dry but significantly improved Lungs:  diffuse wheezing but improved, negative crackles Cardiovascular:  Tachycardic,Regular rhythm without murmur gallop or rub normal S1 and S2 Abdomen: negative abdominal pain, nondistended, positive soft, bowel sounds, no rebound, no ascites, no appreciable mass Extremities: No significant cyanosis,  clubbing, or edema bilateral lower extremities Skin: Negative rashes, lesions, ulcers Psychiatric:  Negative depression, negative anxiety, negative fatigue, negative mania  Central nervous system:  Cranial nerves II through XII intact, tongue/uvula midline, all extremities muscle strength 5/5, sensation intact throughout,  negative dysarthria, negative expressive aphasia, negative receptive aphasia. .     Data Reviewed: Care during the described time interval was provided by me .  I have reviewed this patient's available data, including medical history, events of note, physical examination, and all test results as part of my evaluation. I have personally reviewed and interpreted all radiology studies.  CBC:  Recent Labs Lab 11/11/16 1333 11/12/16 0740 11/14/16 0631  WBC 8.0 6.8 12.0*  NEUTROABS 7.2  --   --   HGB 11.8* 10.5* 10.3*  HCT 35.3* 30.5* 30.5*  MCV 88.7 88.4 86.4  PLT 129* 81* 329   Basic Metabolic Panel:  Recent Labs Lab 11/11/16 1333 11/12/16 0052 11/12/16 0740 11/14/16 0631  NA 136  --  137 140  K 4.1  --  3.4* 3.2*  CL 104  --  114* 119*  CO2  20*  --  14* 13*  GLUCOSE 114*  --  111* 138*  BUN 50*  --  48* 57*  CREATININE 2.44*  --  2.40* 1.73*  CALCIUM 9.0  --  7.6* 8.3*  MG  --  1.8  --   --    GFR: Estimated Creatinine Clearance: 28.9 mL/min (A) (by C-G formula based on SCr of 1.73 mg/dL (H)). Liver Function Tests:  Recent Labs Lab 11/11/16 1333 11/12/16 0740 11/14/16 0631  AST 31 50* 58*  ALT 15* 18 33  ALKPHOS 75 72 59  BILITOT 1.0 0.6 0.4  PROT 6.1* 4.8* 5.1*  ALBUMIN 2.9* 2.2* 2.2*   No results for input(s): LIPASE, AMYLASE in the last 168 hours. No results for input(s): AMMONIA in the last 168 hours. Coagulation Profile: No results for input(s): INR, PROTIME in the last 168 hours. Cardiac Enzymes:  Recent Labs Lab 11/11/16 2024 11/12/16 0052 11/12/16 0740 11/12/16 1351 11/12/16 1911  TROPONINI 0.25* 2.49* 5.58* 7.64* 6.32*   BNP (last 3 results) No results for input(s): PROBNP in the last 8760 hours. HbA1C: No results for input(s): HGBA1C in the last 72 hours. CBG: No results for input(s): GLUCAP in the last 168 hours. Lipid Profile: No results for input(s): CHOL, HDL, LDLCALC, TRIG, CHOLHDL, LDLDIRECT in the last 72 hours. Thyroid Function Tests:  Recent Labs  11/12/16 0052  TSH 3.232   Anemia Panel: No results for input(s): VITAMINB12, FOLATE, FERRITIN, TIBC, IRON, RETICCTPCT in the last 72 hours. Urine analysis:    Component Value Date/Time   COLORURINE AMBER (A) 11/11/2016 1510   APPEARANCEUR HAZY (A) 11/11/2016 1510   LABSPEC 1.015 11/11/2016 1510   PHURINE 5.0 11/11/2016 1510   GLUCOSEU NEGATIVE 11/11/2016 1510   HGBUR MODERATE (A) 11/11/2016 1510   HGBUR negative 11/17/2009 1342   BILIRUBINUR NEGATIVE 11/11/2016 1510   BILIRUBINUR n 05/15/2015 1445   KETONESUR NEGATIVE 11/11/2016 1510   PROTEINUR 100 (A) 11/11/2016 1510   UROBILINOGEN 1.0 05/15/2015 1445   UROBILINOGEN 0.2 07/17/2010 1338   NITRITE POSITIVE (A) 11/11/2016 1510   LEUKOCYTESUR SMALL (A) 11/11/2016 1510    Sepsis Labs: '@LABRCNTIP' (procalcitonin:4,lacticidven:4)  ) Recent Results (from the past 240 hour(s))  Culture, blood (Routine X 2) w Reflex to ID Panel     Status: Abnormal (Preliminary result)   Collection Time: 11/11/16  2:15 PM  Result Value Ref  Range Status   Specimen Description BLOOD LEFT ANTECUBITAL  Final   Special Requests   Final    BOTTLES DRAWN AEROBIC AND ANAEROBIC Blood Culture adequate volume   Culture  Setup Time   Final    GRAM NEGATIVE RODS IN BOTH AEROBIC AND ANAEROBIC BOTTLES CRITICAL RESULT CALLED TO, READ BACK BY AND VERIFIED WITH: Alona Bene PHARMD 0304 11/12/16 A BROWNING    Culture ESCHERICHIA COLI (A)  Final   Report Status PENDING  Incomplete   Organism ID, Bacteria ESCHERICHIA COLI  Final      Susceptibility   Escherichia coli - MIC*    AMPICILLIN 8 SENSITIVE Sensitive     CEFAZOLIN <=4 SENSITIVE Sensitive     CEFEPIME <=1 SENSITIVE Sensitive     CEFTAZIDIME <=1 SENSITIVE Sensitive     CEFTRIAXONE <=1 SENSITIVE Sensitive     CIPROFLOXACIN <=0.25 SENSITIVE Sensitive     GENTAMICIN <=1 SENSITIVE Sensitive     IMIPENEM <=0.25 SENSITIVE Sensitive     TRIMETH/SULFA <=20 SENSITIVE Sensitive     AMPICILLIN/SULBACTAM <=2 SENSITIVE Sensitive     PIP/TAZO <=4 SENSITIVE Sensitive     Extended ESBL NEGATIVE Sensitive     * ESCHERICHIA COLI  Blood Culture ID Panel (Reflexed)     Status: Abnormal   Collection Time: 11/11/16  2:15 PM  Result Value Ref Range Status   Enterococcus species NOT DETECTED NOT DETECTED Final   Listeria monocytogenes NOT DETECTED NOT DETECTED Final   Staphylococcus species NOT DETECTED NOT DETECTED Final   Staphylococcus aureus NOT DETECTED NOT DETECTED Final   Streptococcus species NOT DETECTED NOT DETECTED Final   Streptococcus agalactiae NOT DETECTED NOT DETECTED Final   Streptococcus pneumoniae NOT DETECTED NOT DETECTED Final   Streptococcus pyogenes NOT DETECTED NOT DETECTED Final   Acinetobacter baumannii NOT DETECTED NOT  DETECTED Final   Enterobacteriaceae species DETECTED (A) NOT DETECTED Final    Comment: Enterobacteriaceae represent a large family of gram-negative bacteria, not a single organism. CRITICAL RESULT CALLED TO, READ BACK BY AND VERIFIED WITH: V BRYK PHARMD 0304 11/12/16 A BROWNING    Enterobacter cloacae complex NOT DETECTED NOT DETECTED Final   Escherichia coli DETECTED (A) NOT DETECTED Final    Comment: CRITICAL RESULT CALLED TO, READ BACK BY AND VERIFIED WITH: V BRYK PHARMD 0304 11/12/16 A BROWNING    Klebsiella oxytoca NOT DETECTED NOT DETECTED Final   Klebsiella pneumoniae NOT DETECTED NOT DETECTED Final   Proteus species NOT DETECTED NOT DETECTED Final   Serratia marcescens NOT DETECTED NOT DETECTED Final   Carbapenem resistance NOT DETECTED NOT DETECTED Final   Haemophilus influenzae NOT DETECTED NOT DETECTED Final   Neisseria meningitidis NOT DETECTED NOT DETECTED Final   Pseudomonas aeruginosa NOT DETECTED NOT DETECTED Final   Candida albicans NOT DETECTED NOT DETECTED Final   Candida glabrata NOT DETECTED NOT DETECTED Final   Candida krusei NOT DETECTED NOT DETECTED Final   Candida parapsilosis NOT DETECTED NOT DETECTED Final   Candida tropicalis NOT DETECTED NOT DETECTED Final  Culture, blood (Routine X 2) w Reflex to ID Panel     Status: Abnormal (Preliminary result)   Collection Time: 11/11/16  2:58 PM  Result Value Ref Range Status   Specimen Description BLOOD RIGHT ANTECUBITAL  Final   Special Requests   Final    BOTTLES DRAWN AEROBIC AND ANAEROBIC Blood Culture adequate volume   Culture  Setup Time   Final    GRAM NEGATIVE RODS IN BOTH AEROBIC AND ANAEROBIC BOTTLES CRITICAL VALUE  NOTED.  VALUE IS CONSISTENT WITH PREVIOUSLY REPORTED AND CALLED VALUE.    Culture (A)  Final    ESCHERICHIA COLI SUSCEPTIBILITIES PERFORMED ON PREVIOUS CULTURE WITHIN THE LAST 5 DAYS.    Report Status PENDING  Incomplete  Urine Culture     Status: Abnormal (Preliminary result)    Collection Time: 11/11/16  3:10 PM  Result Value Ref Range Status   Specimen Description URINE, RANDOM  Final   Special Requests NONE  Final   Culture >=100,000 COLONIES/mL ESCHERICHIA COLI (A)  Final   Report Status PENDING  Incomplete  Respiratory Panel by PCR     Status: None   Collection Time: 11/12/16  3:37 AM  Result Value Ref Range Status   Adenovirus NOT DETECTED NOT DETECTED Final   Coronavirus 229E NOT DETECTED NOT DETECTED Final   Coronavirus HKU1 NOT DETECTED NOT DETECTED Final   Coronavirus NL63 NOT DETECTED NOT DETECTED Final   Coronavirus OC43 NOT DETECTED NOT DETECTED Final   Metapneumovirus NOT DETECTED NOT DETECTED Final   Rhinovirus / Enterovirus NOT DETECTED NOT DETECTED Final   Influenza A NOT DETECTED NOT DETECTED Final   Influenza B NOT DETECTED NOT DETECTED Final   Parainfluenza Virus 1 NOT DETECTED NOT DETECTED Final   Parainfluenza Virus 2 NOT DETECTED NOT DETECTED Final   Parainfluenza Virus 3 NOT DETECTED NOT DETECTED Final   Parainfluenza Virus 4 NOT DETECTED NOT DETECTED Final   Respiratory Syncytial Virus NOT DETECTED NOT DETECTED Final   Bordetella pertussis NOT DETECTED NOT DETECTED Final   Chlamydophila pneumoniae NOT DETECTED NOT DETECTED Final   Mycoplasma pneumoniae NOT DETECTED NOT DETECTED Final  MRSA PCR Screening     Status: None   Collection Time: 11/12/16  3:37 AM  Result Value Ref Range Status   MRSA by PCR NEGATIVE NEGATIVE Final    Comment:        The GeneXpert MRSA Assay (FDA approved for NASAL specimens only), is one component of a comprehensive MRSA colonization surveillance program. It is not intended to diagnose MRSA infection nor to guide or monitor treatment for MRSA infections.          Radiology Studies: No results found.      Scheduled Meds: . chlorhexidine  15 mL Mouth Rinse BID  . levalbuterol  1.25 mg Nebulization TID  . mouth rinse  15 mL Mouth Rinse q12n4p  . midodrine  5 mg Oral 3 times per day  .  sodium chloride flush  3 mL Intravenous Q12H  . vitamin B-12  1,000 mcg Oral Daily   Continuous Infusions: . sodium chloride 100 mL/hr at 11/14/16 0739  . cefTRIAXone (ROCEPHIN)  IV Stopped (11/13/16 1639)     LOS: 3 days    Time spent: 40 minutes   Nathanyel Defenbaugh, Geraldo Docker, MD [facility] Pager [phone]   If 7PM-7AM, please contact night-coverage [email or web address] Password [email or web address] 11/14/2016, 9:06 AM

## 2016-11-14 NOTE — Consult Note (Signed)
   [facility] CM Inpatient Consult   11/14/2016  JETHRO RADKE Sep 04, 1925 [id number]  Chart screened for [facility] Care Management needs in the [facility] Medicare ACO. Will follow up for progress patient from home in with urosepsis with confusion.  For questions or referrals, please contact:  Natividad Brood, RN BSN [facility] Liaison  [phone] business mobile phone Toll free office [phone]

## 2016-11-14 NOTE — Progress Notes (Signed)
Per son's request phlebotomy will come back around 5AM to draw labs since Austin Rose just fell asleep.

## 2016-11-14 NOTE — Progress Notes (Signed)
Occupational Therapy Evaluation Patient Details Name: Austin Rose MRN: [id number] DOB: 1925/06/17 Today's Date: 11/14/2016    History of Present Illness Patient is a 83 y/o male who was admitted with urosepsis, fever, hypotension, tachycardia and acute renal failure. + UTI. PMH includes HTN, HLD, CAD with CABG, orthostatic hypotension, back surgery, TIA.   Clinical Impression   PTA, pt lived at home with his wife and was modified independent with ADL and mobility. Pt enjoyed gardening and mowing the lawn. Pt demonstrates a significant decline in function and will benefit form rehab at SNF to facilitate return to PLOF. Wife states pt takes medicine 3x/day for "dizziness" - nsg made aware. "Dizziness" limiting participation this session. Will follow acutely to address established goals and facilitate DC to next venue of care.     Follow Up Recommendations  SNF;Supervision/Assistance - 24 hour    Equipment Recommendations  None recommended by OT    Recommendations for Other Services       Precautions / Restrictions Precautions Precautions: Fall Precaution Comments: hx of vertigo (takes medicine 3x/day at home) RN notified of this. Restrictions Weight Bearing Restrictions: No      Mobility Bed Mobility Overal bed mobility: Needs Assistance Bed Mobility: Supine to Sit     Supine to sit: Max assist;HOB elevated     General bed mobility comments: Assist for LEs, elevate trunk and scoot bottom to EOB. Difficulty sequencing and initiating movement despite cues. + dizziness.  Transfers Overall transfer level: Needs assistance Equipment used: Rolling walker (2 wheeled) Transfers: Sit to/from [facility] Sit to Stand: Mod assist Stand pivot transfers: Min assist;+2 safety/equipment       General transfer comment: Assist to power to standing with cues through hips. Pt not able to get fully upright. + dizziness. Leaning on RW for support. Cues to keep eyes open  and for gaze stabilization. SPT to get to chair with assist for RW negotiation and directional cues.     Balance Overall balance assessment: Needs assistance Sitting-balance support: Feet supported;Single extremity supported Sitting balance-Leahy Scale: Fair Sitting balance - Comments: Requires UE support sitting EOB.   Standing balance support: During functional activity;Bilateral upper extremity supported Standing balance-Leahy Scale: Poor Standing balance comment: Reliant on BUEs for support in standing. + dizziness.                           ADL either performed or assessed with clinical judgement   ADL Overall ADL's : Needs assistance/impaired     Grooming: Moderate assistance;Sitting   Upper Body Bathing: Minimal assistance;Sitting   Lower Body Bathing: Sit to/from stand;Moderate assistance   Upper Body Dressing : Moderate assistance;Sitting   Lower Body Dressing: Moderate assistance;Sit to/from stand       Toileting- Water quality scientist and Hygiene: Total assistance Toileting - Clothing Manipulation Details (indicate cue type and reason): incontinent of BM     Functional mobility during ADLs: +2 for physical assistance;Moderate assistance;Cueing for safety;Cueing for sequencing;Rolling walker       Vision   Additional Comments: will further assess     Perception     Praxis      Pertinent Vitals/Pain Pain Assessment: No/denies pain     Hand Dominance Right   Extremity/Trunk Assessment Upper Extremity Assessment Upper Extremity Assessment: Generalized weakness (limited B shoulder ROM PTA)   Lower Extremity Assessment Lower Extremity Assessment: Defer to PT evaluation   Cervical / Trunk Assessment Cervical / Trunk Assessment: Kyphotic  Communication Communication Communication: No difficulties   Cognition Arousal/Alertness: Lethargic Behavior During Therapy: Flat affect Overall Cognitive Status: Impaired/Different from  baseline Area of Impairment: Orientation;Attention;Following commands;Problem solving                 Orientation Level: Disoriented to;Time;Situation ("April") Current Attention Level: Focused   Following Commands: Follows one step commands with increased time;Follows one step commands inconsistently (repetition and constant cues)     Problem Solving: Slow processing;Decreased initiation;Difficulty sequencing;Requires verbal cues;Requires tactile cues General Comments: very distracted by all lines/leads. Needs cues to stay attended to task and keep eyes open. Confused with responses to questions. Incontinent of stool during transfer. Difficulty problem solving and sequencing tasks.    General Comments  Supine BP 121/80, Sitting BP 109/77, post standing BP 115/90.     Exercises     Shoulder Instructions      Home Living Family/patient expects to be discharged to:: Private residence Living Arrangements: Spouse/significant other Available Help at Discharge: Family;Available 24 hours/day Type of Home: House Home Access: Ramped entrance     Home Layout: One level     Bathroom Shower/Tub: Tub/shower unit;Curtain   Bathroom Toilet: Handicapped height Bathroom Accessibility: Yes   Home Equipment: Environmental consultant - 2 wheels;Shower seat;Grab bars - tub/shower;Walker - 4 wheels;Cane - single point;Hand held shower head   Additional Comments: Pt still mows his grass and uses a weed eater for his lawn (he rides the lawn mover to where he needs to weed eat, does a little area, sits down and drives to the next area).       Prior Functioning/Environment Level of Independence: Independent with assistive device(s)        Comments: Drives. 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        OT Problem List: Decreased strength;Decreased activity tolerance;Decreased range of motion;Impaired balance (sitting and/or standing);Decreased cognition;Decreased safety awareness;Decreased  knowledge of use of DME or AE;Cardiopulmonary status limiting activity      OT Treatment/Interventions: Self-care/ADL training;Therapeutic exercise;DME and/or AE instruction;Therapeutic activities;Cognitive remediation/compensation;Patient/family education;Balance training    OT Goals(Current goals can be found in the care plan section) Acute Rehab OT Goals Patient Stated Goal: per wife to come home OT Goal Formulation: With patient/family Time For Goal Achievement: 11/28/16 Potential to Achieve Goals: Good ADL Goals Pt Will Perform Grooming: with supervision;sitting;with set-up Pt Will Perform Upper Body Bathing: with set-up;with supervision;sitting Pt Will Perform Lower Body Bathing: with min guard assist;sit to/from stand Pt Will Transfer to Toilet: ambulating;with min guard assist;bedside commode  OT Frequency: Min 2X/week   Barriers to D/C:            Co-evaluation PT/OT/SLP Co-Evaluation/Treatment: Yes Reason for Co-Treatment: For patient/therapist safety;To address functional/ADL transfers PT goals addressed during session: Mobility/safety with mobility;Balance;Strengthening/ROM OT goals addressed during session: ADL's and self-care      AM-PAC PT "6 Clicks" Daily Activity     Outcome Measure Help from another person eating meals?: A Little Help from another person taking care of personal grooming?: A Little Help from another person toileting, which includes using toliet, bedpan, or urinal?: A Lot Help from another person bathing (including washing, rinsing, drying)?: A Lot Help from another person to put on and taking off regular upper body clothing?: A Lot Help from another person to put on and taking off regular lower body clothing?: A Lot 6 Click Score: 14   End of Session Equipment Utilized During Treatment: Gait belt;Rolling walker Nurse Communication: Mobility status  Activity  Tolerance: Patient tolerated treatment well Patient left: in chair;with call  bell/phone within reach;with chair alarm set;with family/visitor present  OT Visit Diagnosis: Other abnormalities of gait and mobility (R26.89);Muscle weakness (generalized) (M62.81);Other symptoms and signs involving cognitive function;Dizziness and giddiness (R42)                Time: 9244-6286 OT Time Calculation (min): 33 min Charges:  OT General Charges $OT Visit: 1 Procedure OT Evaluation $OT Eval Moderate Complexity: 1 Procedure G-Codes:     Harrison, OT/L  [phone] 11/14/2016  Torrian Canion,HILLARY 11/14/2016, 2:51 PM

## 2016-11-15 LAB — BASIC METABOLIC PANEL
ANION GAP: 8 (ref 5–15)
BUN: 59 mg/dL — AB (ref 6–20)
CALCIUM: 8.4 mg/dL — AB (ref 8.9–10.3)
CO2: 16 mmol/L — ABNORMAL LOW (ref 22–32)
CREATININE: 1.72 mg/dL — AB (ref 0.61–1.24)
Chloride: 119 mmol/L — ABNORMAL HIGH (ref 101–111)
GFR calc Af Amer: 39 mL/min — ABNORMAL LOW (ref >60)
GFR, EST NON AFRICAN AMERICAN: 33 mL/min — AB (ref >60)
GLUCOSE: 104 mg/dL — AB (ref 65–99)
Potassium: 4.2 mmol/L (ref 3.5–5.1)
Sodium: 143 mmol/L (ref 135–145)

## 2016-11-15 LAB — MAGNESIUM: Magnesium: 2.1 mg/dL (ref 1.7–2.4)

## 2016-11-15 LAB — CBC
HCT: 31.1 % — ABNORMAL LOW (ref 39.0–52.0)
Hemoglobin: 10.3 g/dL — ABNORMAL LOW (ref 13.0–17.0)
MCH: 29.1 pg (ref 26.0–34.0)
MCHC: 33.1 g/dL (ref 30.0–36.0)
MCV: 87.9 fL (ref 78.0–100.0)
PLATELETS: 173 10*3/uL (ref 150–400)
RBC: 3.54 MIL/uL — ABNORMAL LOW (ref 4.22–5.81)
RDW: 15.4 % (ref 11.5–15.5)
WBC: 12.6 10*3/uL — ABNORMAL HIGH (ref 4.0–10.5)

## 2016-11-15 LAB — TROPONIN I: TROPONIN I: 3.13 ng/mL — AB (ref <0.03)

## 2016-11-15 MED ORDER — METOPROLOL TARTRATE 5 MG/5ML IV SOLN
10.0000 mg | INTRAVENOUS | Status: AC
  Administered 2016-11-15: 10 mg via INTRAVENOUS
  Filled 2016-11-15: qty 10

## 2016-11-15 MED ORDER — QUETIAPINE FUMARATE 25 MG PO TABS
12.5000 mg | ORAL_TABLET | Freq: Every day | ORAL | Status: DC
  Administered 2016-11-15 – 2016-11-17 (×2): 12.5 mg via ORAL
  Filled 2016-11-15 (×2): qty 1

## 2016-11-15 MED ORDER — METOPROLOL TARTRATE 5 MG/5ML IV SOLN
10.0000 mg | Freq: Four times a day (QID) | INTRAVENOUS | Status: DC
  Administered 2016-11-15 – 2016-11-18 (×10): 10 mg via INTRAVENOUS
  Filled 2016-11-15 (×10): qty 10

## 2016-11-15 MED ORDER — METOPROLOL TARTRATE 5 MG/5ML IV SOLN
10.0000 mg | Freq: Four times a day (QID) | INTRAVENOUS | Status: DC
  Filled 2016-11-15: qty 10

## 2016-11-15 NOTE — Evaluation (Signed)
Clinical/Bedside Swallow Evaluation Patient Details  Name: Austin Rose MRN: [id number] Date of Birth: 12/21/1925  Today's Date: 11/15/2016 Time: SLP Start Time (ACUTE ONLY): 0836 SLP Stop Time (ACUTE ONLY): 0855 SLP Time Calculation (min) (ACUTE ONLY): 19 min  Past Medical History:  Past Medical History:  Diagnosis Date  . Arthritis   . CAD (coronary artery disease)   . Cancer (Austin Rose)   . History of stress test 09/20/2008  . Hx of echocardiogram 07/20/2010   The cavity size was normal, there is mild aortic stenosis,the aortic root was normal is size, the ascending aorta was normal in size.  [name] Hx of varicella   . Hyperlipemia   . Hypertension   . Orthostatic hypotension    [facility]   Past Surgical History:  Past Surgical History:  Procedure Laterality Date  . BACK SURGERY     15s1Repeat Foraminotomy  . cataract surgery     digby   . CORONARY ARTERY BYPASS GRAFT  04/1999   Dr Austin Rose, showing 4 vessel disease and underwent multivessel CABG aththat time.  . LUMBAR DISC SURGERY     L4-5  decompression  . microdisscectomy     bilateral  . OTHER SURGICAL HISTORY     central decompression   HPI:  Patient is a 83 y/o male who was admitted with urosepsis, fever, hypotension, tachycardia and acute renal failure. + UTI. PMH includes HTN, HLD, CAD with CABG, orthostatic hypotension, back surgery, TIA.   Assessment / Plan / Recommendation Clinical Impression  Pt difficult to arouse - minimal participation in swallow assessment.  Accepted limited ice chips and water with no overt s/s of aspiration of these consistencies.  No focal deficits.  No solids offered given lethargy.  RN reports adequate toleration of applesauce yesterday.  Continue current diet only when pt is alert; hold POs when lethargic.  SLP will f/u next date. D/W RN.  SLP Visit Diagnosis: Dysphagia, unspecified (R13.10)    Aspiration Risk  Moderate aspiration risk    Diet Recommendation   continue  clear liquids; allow when alert  Medication Administration: Whole meds with puree    Other  Recommendations Oral Care Recommendations: Oral care BID   Follow up Recommendations  (tba)      Frequency and Duration min 2x/week  1 week       Prognosis Prognosis for Safe Diet Advancement: Good      Swallow Study   General Date of Onset: 11/11/16 HPI: Patient is a 83 y/o male who was admitted with urosepsis, fever, hypotension, tachycardia and acute renal failure. + UTI. PMH includes HTN, HLD, CAD with CABG, orthostatic hypotension, back surgery, TIA. Type of Study: Bedside Swallow Evaluation Previous Swallow Assessment: no Temperature Spikes Noted: No Respiratory Status: Room air History of Recent Intubation: No Behavior/Cognition: Lethargic/Drowsy Oral Cavity Assessment: Dry Oral Care Completed by SLP: Yes Oral Cavity - Dentition: Edentulous Self-Feeding Abilities: Total assist Patient Positioning: Upright in bed Baseline Vocal Quality: Normal Volitional Cough: Cognitively unable to elicit Volitional Swallow: Unable to elicit    Oral/Motor/Sensory Function Overall Oral Motor/Sensory Function: Other (comment) (symmetric; did not follow commands for assessment)   Ice Chips Ice chips: Impaired Presentation: Spoon Oral Phase Impairments: Reduced labial seal;Poor awareness of bolus   Thin Liquid Thin Liquid: Impaired Presentation: Spoon Oral Phase Impairments: Reduced labial seal;Poor awareness of bolus    Nectar Thick Nectar Thick Liquid: Not tested   Honey Thick Honey Thick Liquid: Not tested   Puree Puree: Not tested  Solid   GO   Solid: Not tested       Austin Rose L. Tivis Ringer, [place] CCC/SLP Pager [phone]  Austin Rose Laurice 11/15/2016,9:04 AM

## 2016-11-15 NOTE — Progress Notes (Signed)
[place] TEAM 1 - Stepdown/ICU TEAM  Austin Rose  FIE:[id number] DOB: 01/08/1926 DOA: 11/11/2016 PCP: Burnis Medin, MD    Brief Narrative:  83yo M w/ a hx of Orthostatic Hypotension on Midodrine, Aortic Stenosis, Pulmonary HTN, remote CAD/CABG, arthritis, HLD and Cancer who was sent to the ER after developing progressive confusion (did not recognize family members)in the setting of persistent fevers. He was not speaking but was moving all extremitis. He was incontinent of a large amount of dark colored strong smelling urine.  In the ER chest x-ray was unremarkable, rectal temperature was 166.0, systolic blood pressure in the 90-110 range, room air saturations were 97%. He was found to have acute kidney injury with mild metabolic acidosis, lactic acid was elevated at 2.19, troponin mildly elevated at 0.09 with subtle ST segment changes in leads V2 through V5. He was also found to have mild thrombocytopenia.  CT head unremarkable.  Subjective: The pt has been very sedate today, after being very agitated early this AM.  He is presently not able to take oral meds.  He is in no acute resp distress at the time of my visit.    Assessment & Plan:   Severe sepsis due to Escherichia coli UTI w/ bacteremia Cont abx tx - will need a full 14 days of tx given bacteremia - cont rocephin   Acute delirium - acute metabolic encephalopathy Most c/w TME in setting of severe sepsis - waxing and waning - begin QHS seroquel - avoid benzos if able   Thrombocytopenia Due to gram neg rod bacteremia - improving w/ tx of same   Acute kidney injury Likely ATN due to severe sepsis - slowly improving - cot to follow trend   Recent Labs Lab 11/11/16 1333 11/12/16 0740 11/14/16 0631 11/15/16 0306  CREATININE 2.44* 2.40* 6.30* 1.60*    Systolic congestive heart failure No clinical evidence on exam of signif volume overload - appears euvolemic at this time - follow Is/Os and daily weights Filed  Weights   11/13/16 0500 11/14/16 0300 11/15/16 0348  Weight: 77.4 kg (170 lb 10.2 oz) 79.1 kg (174 lb 6.1 oz) 80.4 kg (177 lb 4 oz)    Aortic stenosis - bicuspid AoV Will complicate volume management and predispose pt to quickly shift from [facility] to volume overload - follow Is/Os   Coronary artery disease status post CABG - mildly elevated troponin Felt to represent demand ischemia - trop has peaked at 7.64 - not a candidate for aggressive intervention per Cardiology - cont medical tx   Chronic LBBB w/ intermittent SVT  Chronic orthostatic hypotension cont midodrine   Chronic arthritis  Hypokalemia  Corrected w/ replacement   HLD  DVT prophylaxis: SCDs Code Status: FULL CODE Family Communication: no family present at time of exam  Disposition Plan: SDU - begin PT/OT   Consultants:  Cardiology   Procedures: 7/31 TTE - EF 40-45 percent with mild diffuse hypokinesis - mild to moderate aortic stenosis - poor study due to tachycardia and agitation   Antimicrobials:  Aztreonam 7/30 > 7/31 Rocephin 8/1 >  Objective: Blood pressure (!) 133/94, pulse 88, temperature 97.8 F (36.6 C), temperature source Axillary, resp. rate (!) 24, height 5\' 9"  (1.753 m), weight 80.4 kg (177 lb 4 oz), SpO2 98 %.  Intake/Output Summary (Last 24 hours) at 11/15/16 1442 Last data filed at 11/15/16 1255  Gross per 24 hour  Intake             2490 ml  Output              975 ml  Net             1515 ml   Filed Weights   11/13/16 0500 11/14/16 0300 11/15/16 0348  Weight: 77.4 kg (170 lb 10.2 oz) 79.1 kg (174 lb 6.1 oz) 80.4 kg (177 lb 4 oz)    Examination: General: No acute respiratory distress at rest  Lungs: CTA th/o - no wheezing  Cardiovascular: RRR - no gallup or rub  Abdomen: Nontender, nondistended, soft, bowel sounds positive, no rebound Extremities: No significant edema bilateral lower extremities  CBC:  Recent Labs Lab 11/11/16 1333 11/12/16 0740 11/14/16 0631 11/15/16 0306    WBC 8.0 6.8 12.0* 12.6*  NEUTROABS 7.2  --   --   --   HGB 11.8* 10.5* 10.3* 10.3*  HCT 35.3* 30.5* 30.5* 31.1*  MCV 88.7 88.4 86.4 87.9  PLT 129* 81* 155 053   Basic Metabolic Panel:  Recent Labs Lab 11/11/16 1333 11/12/16 0052 11/12/16 0740 11/14/16 0631 11/14/16 1125 11/14/16 2038 11/15/16 0306  NA 136  --  137 140  --   --  143  K 4.1  --  3.4* 3.2* 3.1* 3.6 4.2  CL 104  --  114* 119*  --   --  119*  CO2 20*  --  14* 13*  --   --  16*  GLUCOSE 114*  --  111* 138*  --   --  104*  BUN 50*  --  48* 57*  --   --  59*  CREATININE 2.44*  --  2.40* 1.73*  --   --  1.72*  CALCIUM 9.0  --  7.6* 8.3*  --   --  8.4*  MG  --  1.8  --   --  2.1 2.1 2.1   GFR: Estimated Creatinine Clearance: 29.1 mL/min (A) (by C-G formula based on SCr of 1.72 mg/dL (H)).  Liver Function Tests:  Recent Labs Lab 11/11/16 1333 11/12/16 0740 11/14/16 0631  AST 31 50* 58*  ALT 15* 18 33  ALKPHOS 75 72 59  BILITOT 1.0 0.6 0.4  PROT 6.1* 4.8* 5.1*  ALBUMIN 2.9* 2.2* 2.2*    Cardiac Enzymes:  Recent Labs Lab 11/12/16 0052 11/12/16 0740 11/12/16 1351 11/12/16 1911 11/15/16 0306  TROPONINI 2.49* 5.58* 7.64* 6.32* 3.13*    HbA1C: Hgb A1c MFr Bld  Date/Time Value Ref Range Status  04/18/2016 07:21 AM 5.1 4.8 - 5.6 % Final    Comment:    (NOTE)         Pre-diabetes: 5.7 - 6.4         Diabetes: >6.4         Glycemic control for adults with diabetes: <7.0     Recent Results (from the past 240 hour(s))  Culture, blood (Routine X 2) w Reflex to ID Panel     Status: Abnormal   Collection Time: 11/11/16  2:15 PM  Result Value Ref Range Status   Specimen Description BLOOD LEFT ANTECUBITAL  Final   Special Requests   Final    BOTTLES DRAWN AEROBIC AND ANAEROBIC Blood Culture adequate volume   Culture  Setup Time   Final    GRAM NEGATIVE RODS IN BOTH AEROBIC AND ANAEROBIC BOTTLES CRITICAL RESULT CALLED TO, READ BACK BY AND VERIFIED WITH: V BRYK PHARMD 0304 11/12/16 A BROWNING     Culture ESCHERICHIA COLI (A)  Final   Report Status 11/14/2016  FINAL  Final   Organism ID, Bacteria ESCHERICHIA COLI  Final      Susceptibility   Escherichia coli - MIC*    AMPICILLIN 8 SENSITIVE Sensitive     CEFAZOLIN <=4 SENSITIVE Sensitive     CEFEPIME <=1 SENSITIVE Sensitive     CEFTAZIDIME <=1 SENSITIVE Sensitive     CEFTRIAXONE <=1 SENSITIVE Sensitive     CIPROFLOXACIN <=0.25 SENSITIVE Sensitive     GENTAMICIN <=1 SENSITIVE Sensitive     IMIPENEM <=0.25 SENSITIVE Sensitive     TRIMETH/SULFA <=20 SENSITIVE Sensitive     AMPICILLIN/SULBACTAM <=2 SENSITIVE Sensitive     PIP/TAZO <=4 SENSITIVE Sensitive     Extended ESBL NEGATIVE Sensitive     * ESCHERICHIA COLI  Blood Culture ID Panel (Reflexed)     Status: Abnormal   Collection Time: 11/11/16  2:15 PM  Result Value Ref Range Status   Enterococcus species NOT DETECTED NOT DETECTED Final   Listeria monocytogenes NOT DETECTED NOT DETECTED Final   Staphylococcus species NOT DETECTED NOT DETECTED Final   Staphylococcus aureus NOT DETECTED NOT DETECTED Final   Streptococcus species NOT DETECTED NOT DETECTED Final   Streptococcus agalactiae NOT DETECTED NOT DETECTED Final   Streptococcus pneumoniae NOT DETECTED NOT DETECTED Final   Streptococcus pyogenes NOT DETECTED NOT DETECTED Final   Acinetobacter baumannii NOT DETECTED NOT DETECTED Final   Enterobacteriaceae species DETECTED (A) NOT DETECTED Final    Comment: Enterobacteriaceae represent a large family of gram-negative bacteria, not a single organism. CRITICAL RESULT CALLED TO, READ BACK BY AND VERIFIED WITH: V BRYK PHARMD 0304 11/12/16 A BROWNING    Enterobacter cloacae complex NOT DETECTED NOT DETECTED Final   Escherichia coli DETECTED (A) NOT DETECTED Final    Comment: CRITICAL RESULT CALLED TO, READ BACK BY AND VERIFIED WITH: V BRYK PHARMD 0304 11/12/16 A BROWNING    Klebsiella oxytoca NOT DETECTED NOT DETECTED Final   Klebsiella pneumoniae NOT DETECTED NOT DETECTED  Final   Proteus species NOT DETECTED NOT DETECTED Final   Serratia marcescens NOT DETECTED NOT DETECTED Final   Carbapenem resistance NOT DETECTED NOT DETECTED Final   Haemophilus influenzae NOT DETECTED NOT DETECTED Final   Neisseria meningitidis NOT DETECTED NOT DETECTED Final   Pseudomonas aeruginosa NOT DETECTED NOT DETECTED Final   Candida albicans NOT DETECTED NOT DETECTED Final   Candida glabrata NOT DETECTED NOT DETECTED Final   Candida krusei NOT DETECTED NOT DETECTED Final   Candida parapsilosis NOT DETECTED NOT DETECTED Final   Candida tropicalis NOT DETECTED NOT DETECTED Final  Culture, blood (Routine X 2) w Reflex to ID Panel     Status: Abnormal   Collection Time: 11/11/16  2:58 PM  Result Value Ref Range Status   Specimen Description BLOOD RIGHT ANTECUBITAL  Final   Special Requests   Final    BOTTLES DRAWN AEROBIC AND ANAEROBIC Blood Culture adequate volume   Culture  Setup Time   Final    GRAM NEGATIVE RODS IN BOTH AEROBIC AND ANAEROBIC BOTTLES CRITICAL VALUE NOTED.  VALUE IS CONSISTENT WITH PREVIOUSLY REPORTED AND CALLED VALUE.    Culture (A)  Final    ESCHERICHIA COLI SUSCEPTIBILITIES PERFORMED ON PREVIOUS CULTURE WITHIN THE LAST 5 DAYS.    Report Status 11/14/2016 FINAL  Final  Urine Culture     Status: Abnormal   Collection Time: 11/11/16  3:10 PM  Result Value Ref Range Status   Specimen Description URINE, RANDOM  Final   Special Requests NONE  Final   Culture >=  100,000 COLONIES/mL ESCHERICHIA COLI (A)  Final   Report Status 11/14/2016 FINAL  Final   Organism ID, Bacteria ESCHERICHIA COLI (A)  Final      Susceptibility   Escherichia coli - MIC*    AMPICILLIN 8 SENSITIVE Sensitive     CEFAZOLIN <=4 SENSITIVE Sensitive     CEFTRIAXONE <=1 SENSITIVE Sensitive     CIPROFLOXACIN <=0.25 SENSITIVE Sensitive     GENTAMICIN <=1 SENSITIVE Sensitive     IMIPENEM <=0.25 SENSITIVE Sensitive     NITROFURANTOIN <=16 SENSITIVE Sensitive     TRIMETH/SULFA <=20  SENSITIVE Sensitive     AMPICILLIN/SULBACTAM <=2 SENSITIVE Sensitive     PIP/TAZO <=4 SENSITIVE Sensitive     Extended ESBL NEGATIVE Sensitive     * >=100,000 COLONIES/mL ESCHERICHIA COLI  Respiratory Panel by PCR     Status: None   Collection Time: 11/12/16  3:37 AM  Result Value Ref Range Status   Adenovirus NOT DETECTED NOT DETECTED Final   Coronavirus 229E NOT DETECTED NOT DETECTED Final   Coronavirus HKU1 NOT DETECTED NOT DETECTED Final   Coronavirus NL63 NOT DETECTED NOT DETECTED Final   Coronavirus OC43 NOT DETECTED NOT DETECTED Final   Metapneumovirus NOT DETECTED NOT DETECTED Final   Rhinovirus / Enterovirus NOT DETECTED NOT DETECTED Final   Influenza A NOT DETECTED NOT DETECTED Final   Influenza B NOT DETECTED NOT DETECTED Final   Parainfluenza Virus 1 NOT DETECTED NOT DETECTED Final   Parainfluenza Virus 2 NOT DETECTED NOT DETECTED Final   Parainfluenza Virus 3 NOT DETECTED NOT DETECTED Final   Parainfluenza Virus 4 NOT DETECTED NOT DETECTED Final   Respiratory Syncytial Virus NOT DETECTED NOT DETECTED Final   Bordetella pertussis NOT DETECTED NOT DETECTED Final   Chlamydophila pneumoniae NOT DETECTED NOT DETECTED Final   Mycoplasma pneumoniae NOT DETECTED NOT DETECTED Final  MRSA PCR Screening     Status: None   Collection Time: 11/12/16  3:37 AM  Result Value Ref Range Status   MRSA by PCR NEGATIVE NEGATIVE Final    Comment:        The GeneXpert MRSA Assay (FDA approved for NASAL specimens only), is one component of a comprehensive MRSA colonization surveillance program. It is not intended to diagnose MRSA infection nor to guide or monitor treatment for MRSA infections.      Scheduled Meds: . chlorhexidine  15 mL Mouth Rinse BID  . levalbuterol  1.25 mg Nebulization TID  . mouth rinse  15 mL Mouth Rinse q12n4p  . metoprolol tartrate  12.5 mg Oral BID  . midodrine  5 mg Oral 3 times per day  . sodium chloride flush  3 mL Intravenous Q12H  . vitamin B-12   1,000 mcg Oral Daily     LOS: 4 days   Cherene Altes, MD [facility] Office  [phone] Pager - Text Page per Amion as per below:  On-Call/Text Page:      [email or web address]      password [email or web address]  If 7PM-7AM, please contact night-coverage [email or web address] Password [email or web address] 11/15/2016, 2:42 PM

## 2016-11-15 NOTE — NC FL2 (Signed)
[place] LEVEL OF CARE SCREENING TOOL     IDENTIFICATION  Patient Name: Austin Rose Birthdate: 06-Mar-1926 Sex: male Admission Date (Current Location): 11/11/2016  [facility] and [place] Number:  Herbalist and Address:  [facility], [place] [address] [place], [place] [address]      Provider Number: [id number]  Attending Physician Name and Address:  Cherene Altes, MD  Relative Name and Phone Number:       Current Level of Care: Hospital Recommended Level of Care: [place] Prior Approval Number:    Date Approved/Denied:   PASRR Number: [id number]  Discharge Plan: SNF    Current Diagnoses: Patient Active Problem List   Diagnosis Date Noted  . Pressure injury of skin 11/12/2016  . Sepsis ([place]) 11/11/2016  . Acute kidney injury ([place]) 11/11/2016  . Hypertension 11/11/2016  . CAD (coronary artery disease) 11/11/2016  . Arthritis 11/11/2016  . Elevated troponin 11/11/2016  . Thrombocytopenia, idiopathic ([place]) 11/11/2016  . Orthostatic hypotension 11/11/2016  . Acute metabolic encephalopathy 82/42/3536  . Aortic stenosis, moderate w/ mild pulmonary HTN 11/11/2016  . Hyperlipemia 11/11/2016  . Sepsis secondary to UTI ([place])   . Confusion 07/16/2016  . Cyanocobalamin deficiency 07/16/2016  . Coronary artery disease involving native coronary artery of native heart without angina pectoris   . Arterial hypotension   . History of cerebral infarction 06/04/2016  . Hearing decreased, unspecified laterality 06/04/2016  . Restless legs 06/04/2016  . Multiple lacunar infarcts ([place]) 04/30/2016  . Calculus of gallbladder without cholecystitis without obstruction 04/30/2016  . Thrombocytopenia ([place]) 04/30/2016  . Expressive aphasia 04/18/2016  . CAD (coronary artery disease) of artery bypass graft 04/18/2016  . TIA (transient ischemic attack) 04/17/2016  . Senile ecchymosis 09/20/2014  . Hyperlipidemia  06/07/2014  . Primary osteoarthritis involving multiple joints 01/20/2014  . Coronary artery disease due to lipid rich plaque 01/20/2014  . Age factor 01/20/2014  . Medication side effect 01/20/2014  . Smokeless tobacco use within past 30 days 06/03/2013  . Skin abnormalities 05/18/2013  . Pain management 05/18/2013  . Abnormal TSH 05/18/2013  . Unspecified vitamin D deficiency 05/18/2013  . Viral URI with cough 11/04/2012  . Sleep difficulties 11/06/2011  . High risk medication use 04/10/2011  . Chronic orthostatic hypotension 05/16/2010  . Labyrinthitis 05/16/2010  . DIZZINESS 02/23/2009  . DEGENERATIVE JOINT DISEASE, SHOULDER 07/20/2008  . ARTHRITIS, KNEES, BILATERAL 07/20/2008  . FROZEN RIGHT SHOULDER 07/20/2008  . Hypothyroidism 11/11/2007  . Hereditary and idiopathic peripheral neuropathy 11/11/2007  . CONSTIPATION, CHRONIC 11/11/2007  . OSTEOPENIA 11/11/2007  . GASTRIC ULCER, HX OF 11/11/2007  . HYPERLIPIDEMIA 03/31/2007  . Essential hypertension 03/31/2007  . CORONARY ARTERY DISEASE 03/31/2007  . IBS 03/31/2007    Orientation RESPIRATION BLADDER Height & Weight     Self  Normal Incontinent, External catheter Weight: 177 lb 4 oz (80.4 kg) Height:  5\' 9"  (175.3 cm)  BEHAVIORAL SYMPTOMS/MOOD NEUROLOGICAL BOWEL NUTRITION STATUS      Incontinent Diet (see DC summary)  AMBULATORY STATUS COMMUNICATION OF NEEDS Skin   Extensive Assist   PU Stage and Appropriate Care   PU Stage 2 Dressing:  (located on sacrum needs foam dressing changes)                   Personal Care Assistance Level of Assistance  Bathing, Dressing Bathing Assistance: Maximum assistance   Dressing Assistance: Maximum assistance     Functional Limitations Info  SPECIAL CARE FACTORS FREQUENCY  PT (By licensed PT), OT (By licensed OT)     PT Frequency: 5/wk OT Frequency: 5/wk            Contractures      Additional Factors Info  Code Status, Allergies Code Status  Info: FULL Allergies Info: Fentanyl, Gabapentin, Norco Hydrocodone-acetaminophen, Tramadol, Penicillins           Current Medications (11/15/2016):  This is the current hospital active medication list Current Facility-Administered Medications  Medication Dose Route Frequency Provider Last Rate Last Dose  . 0.45 % sodium chloride infusion   Intravenous Continuous Cherene Altes, MD 100 mL/hr at 11/14/16 1829    . acetaminophen (TYLENOL) tablet 650 mg  650 mg Oral Q6H PRN Cherene Altes, MD       Or  . acetaminophen (TYLENOL) suppository 650 mg  650 mg Rectal Q6H PRN Cherene Altes, MD      . cefTRIAXone (ROCEPHIN) 2 g in dextrose 5 % 50 mL IVPB  2 g Intravenous Q24H Cherene Altes, MD   Stopped at 11/14/16 1843  . chlorhexidine (PERIDEX) 0.12 % solution 15 mL  15 mL Mouth Rinse BID Allie Bossier, MD   15 mL at 11/14/16 2124  . levalbuterol (XOPENEX) nebulizer solution 1.25 mg  1.25 mg Nebulization Q3H PRN Cherene Altes, MD   1.25 mg at 11/14/16 0057  . levalbuterol (XOPENEX) nebulizer solution 1.25 mg  1.25 mg Nebulization TID Cherene Altes, MD   1.25 mg at 11/15/16 0739  . MEDLINE mouth rinse  15 mL Mouth Rinse q12n4p Allie Bossier, MD   15 mL at 11/13/16 1606  . metoprolol tartrate (LOPRESSOR) tablet 12.5 mg  12.5 mg Oral BID Sueanne Margarita, MD   12.5 mg at 11/14/16 2124  . midodrine (PROAMATINE) tablet 5 mg  5 mg Oral 3 times per day Cherene Altes, MD   5 mg at 11/14/16 1823  . ondansetron (ZOFRAN) tablet 4 mg  4 mg Oral Q6H PRN Samella Parr, NP       Or  . ondansetron [facility]) injection 4 mg  4 mg Intravenous Q6H PRN Erin Hearing L, NP      . sodium chloride flush (NS) 0.9 % injection 3 mL  3 mL Intravenous Q12H Samella Parr, NP   3 mL at 11/14/16 2124  . vitamin B-12 (CYANOCOBALAMIN) tablet 1,000 mcg  1,000 mcg Oral Daily Joette Catching T, MD   1,000 mcg at 11/14/16 1100  . zolpidem (AMBIEN) tablet 5 mg  5 mg Oral QHS PRN Jani Gravel, MD   5  mg at 11/14/16 2151     Discharge Medications: Please see discharge summary for a list of discharge medications.  Relevant Imaging Results:  Relevant Lab Results:   Additional Information SS#: [id number]  Jorge Ny, LCSW

## 2016-11-15 NOTE — Progress Notes (Signed)
Progress Note  Patient Name: Austin Rose Date of Encounter: 11/15/2016  Primary Cardiologist: Dr. Claiborne Billings  Subjective   Much more confused today.  Did not sleep all night.  Still having frequent nonsustained atrial tachycardia  Inpatient Medications    Scheduled Meds: . chlorhexidine  15 mL Mouth Rinse BID  . levalbuterol  1.25 mg Nebulization TID  . mouth rinse  15 mL Mouth Rinse q12n4p  . metoprolol tartrate  12.5 mg Oral BID  . midodrine  5 mg Oral 3 times per day  . sodium chloride flush  3 mL Intravenous Q12H  . vitamin B-12  1,000 mcg Oral Daily   Continuous Infusions: . sodium chloride 100 mL/hr at 11/14/16 1829  . cefTRIAXone (ROCEPHIN)  IV Stopped (11/14/16 1843)   PRN Meds: acetaminophen **OR** acetaminophen, levalbuterol, ondansetron **OR** ondansetron (ZOFRAN) IV, zolpidem   Vital Signs    Vitals:   11/15/16 0348 11/15/16 0741 11/15/16 0755 11/15/16 1157  BP: 117/80  (!) 133/94   Pulse: 88     Resp: (!) 24     Temp: 98 F (36.7 C)  (!) 97.3 F (36.3 C) 97.8 F (36.6 C)  TempSrc: Oral  Oral Axillary  SpO2: 98% 98%    Weight: 177 lb 4 oz (80.4 kg)     Height:        Intake/Output Summary (Last 24 hours) at 11/15/16 1425 Last data filed at 11/15/16 1255  Gross per 24 hour  Intake             2490 ml  Output              975 ml  Net             1515 ml   Filed Weights   11/13/16 0500 11/14/16 0300 11/15/16 0348  Weight: 170 lb 10.2 oz (77.4 kg) 174 lb 6.1 oz (79.1 kg) 177 lb 4 oz (80.4 kg)    Telemetry    NSR with frequent burst of nonsustained atrial tachycardia vs. MAT - Personally Reviewed  ECG    No new EKG to review - Personally Reviewed  Physical Exam   GEN: very confused and agitated pulling at the air Neck: no JVD Cardiac: RRR with frequent ectopy and no M/R/G Respiratory: CTA bilaterally anteriorly GI: soft NT ND MS: No edema Neuro:  unable to assess due to confusion and delerium Psych: very confused  Labs      Chemistry  Recent Labs Lab 11/11/16 1333 11/12/16 0740 11/14/16 0631 11/14/16 1125 11/14/16 2038 11/15/16 0306  NA 136 137 140  --   --  143  K 4.1 3.4* 3.2* 3.1* 3.6 4.2  CL 104 114* 119*  --   --  119*  CO2 20* 14* 13*  --   --  16*  GLUCOSE 114* 111* 138*  --   --  104*  BUN 50* 48* 57*  --   --  59*  CREATININE 2.44* 2.40* 1.73*  --   --  1.72*  CALCIUM 9.0 7.6* 8.3*  --   --  8.4*  PROT 6.1* 4.8* 5.1*  --   --   --   ALBUMIN 2.9* 2.2* 2.2*  --   --   --   AST 31 50* 58*  --   --   --   ALT 15* 18 33  --   --   --   ALKPHOS 75 72 59  --   --   --  BILITOT 1.0 0.6 0.4  --   --   --   GFRNONAA 22* 22* 33*  --   --  33*  GFRAA 25* 26* 39*  --   --  39*  ANIONGAP 12 9 8   --   --  8     Hematology  Recent Labs Lab 11/12/16 0740 11/14/16 0631 11/15/16 0306  WBC 6.8 12.0* 12.6*  RBC 3.45* 3.53* 3.54*  HGB 10.5* 10.3* 10.3*  HCT 30.5* 30.5* 31.1*  MCV 88.4 86.4 87.9  MCH 30.4 29.2 29.1  MCHC 34.4 33.8 33.1  RDW 14.7 15.1 15.4  PLT 81* 155 173    Cardiac Enzymes  Recent Labs Lab 11/12/16 0740 11/12/16 1351 11/12/16 1911 11/15/16 0306  TROPONINI 5.58* 7.64* 6.32* 3.13*   No results for input(s): TROPIPOC in the last 168 hours.   BNPNo results for input(s): BNP, PROBNP in the last 168 hours.   DDimer No results for input(s): DDIMER in the last 168 hours.   Radiology    No results found.  Cardiac Studies   Study Conclusions  - Left ventricle: The cavity size was moderately dilated. Wall thickness was normal. Systolic function was low normal. The estimated ejection fraction was in the range of 50% to 55%. Inferior wall hypokinesis. Doppler parameters are consistent with abnormal left ventricular relaxation (grade 1 diastolic dysfunction). The E/e&' ratio is between 8-15, suggesting indeterminate LV filling pressure. - Aortic valve: Functionally bicuspid, moderately calcified valve with restriction of the left coronary cusp.  Moderate stenosis. There was no regurgitation. Mean gradient (S): 14 mm Hg. Peak gradient (S): 22 mm Hg. Valve area (VTI): 1.22 cm^2. Valve area (Vmean): 1.18 cm^2. - Mitral valve: Mildly thickened leaflets . There was mild regurgitation. - Left atrium: The atrium was mildly dilated. - Tricuspid valve: There was mild regurgitation. - Pulmonary arteries: PA peak pressure: 34 mm Hg (S). - Inferior vena cava: The vessel was normal in size. The respirophasic diameter changes were in the normal range (= 50%), consistent with normal central venous pressure.  Impressions:  - Compared to a prior study in 2012, the LVEF is lower at 50-55% with inferior hypokinesis. The LV wall thickness is normal with moderate dilation of the LV. There is now moderate aortic stenosis with an AVA of 1.2 cm2.  Patient Profile     83 y.o. male with a hx of CAD s/p remote CABGwho is being seen for the evaluation of elevated troponinin the setting of AKI and urosepsis.  Assessment & Plan    1. Elevated troponin in the setting of urosepsis, fever, hypotension, tachycardia and acute renal failure. Per his daughter, he has not complained of any chest pain or SOB prior to getting sick.  - Trop has peaked and trending downward (0.09>0.25>2.49>5.58>7.64>6.32) -This is likely related to demand ischemia and not ACS although higher than what would usually be expected for demand ischemia. Given his advanced age, debilitated state, sepsis, AKI and tachycardia I still suspect this is demand ischemia - 2D echo showed EF 40-45% with inferior HK which is new from prior echo in January but study of poor quality and it was recommended to repeat limited study with definity once HR better controlled. - given advanced age, debilitated state, urosepsis and acute renal failure, he is not a candidate for invasive cardiac procedures at this time and likely not in the future. - continue statin and ASA  2. ASCAD  s/p remote CABG - continue statin and ASA  3. Chronic LBBB -  unchanged from prior EKG  4. SVT - tele continues to show runs of SVT with same QRS morphology as baseline LBBB and therefore likely SVT secondary to his underlying illness.  - This is likely being driven by his fever and sepsis and hopefully will improve as acute illness improves. - still having frequent runs of nonsustained atrial tachycardia (?MAT). - given reduced LVF would avoid CCB - added low dose metoprolol 12.5mg  BID for suppression yesterday but still having significant runs of atrial tach - increase metoprolol to 25mg  BID and titrate at BP allows - no obvious Afib noted  5. AKI secondary to urosepsis and fever.  - creatinine improved from 2.44>1.73>1.72 today - per TRH  6. Bicuspid AV with moderate AS by echo 04/2016 with no change by echo 10/2016 by gradient and AVA.  No evidence of vegetation on echo but poor quality.  He is not a candidate for TEE at this time.   7.  Hypokalemia  - repleted per [facility]  Signed, Fransico Him, MD  11/15/2016, 2:25 PM

## 2016-11-15 NOTE — Progress Notes (Signed)
Calll and text to Dr Dorothy Puffer about patient have multiple episodes of rates of 160s to please advise.

## 2016-11-15 NOTE — Care Management Important Message (Signed)
Important Message  Patient Details  Name: Austin Rose MRN: [id number] Date of Birth: [birth year]   Medicare Important Message Given:  Yes    Nathen May 11/15/2016, 9:32 AM

## 2016-11-15 NOTE — Progress Notes (Signed)
Family in earlier today.  Daughter upset because she did not understand father had dx of sepsis until wife stated they had been told that from patient's dx of UTI.  Support given to family about patient's change in LOC today.  Daughter was able to get patient to sing words of a familiar song and this seemed to make the family happy.  Patient continues to refuse anything by mouth. Discussed patient's inability to take meds with Dr Thereasa Solo today and continued inability to sleep at [facility] with confusion.  Dr Thereasa Solo to follow up.

## 2016-11-15 NOTE — Progress Notes (Signed)
Gave 10 mg Lopressor over 5 min IVP as order for heart rate greater than 160 and blood pressure 122/94 .  Currently HR 98 and BP 127/80 and patient remains confuse with hallucinations and delusions.

## 2016-11-15 NOTE — Clinical Social Work Note (Signed)
Clinical Social Work Assessment  Patient Details  Name: Austin Rose MRN: [id number] Date of Birth: 12-02-1925  Date of referral:  11/15/16               Reason for consult:  Facility Placement                Permission sought to share information with:  Chartered certified accountant granted to share information::  Yes, Verbal Permission Granted  Name::     Austin Rose  Agency::  SNF  Relationship::  wife, dtr  Contact Information:     Housing/Transportation Living arrangements for the past 2 months:  Single Family Home Source of Information:  Adult Children, Spouse Patient Interpreter Needed:  None Criminal Activity/Legal Involvement Pertinent to Current Situation/Hospitalization:  No - Comment as needed Significant Relationships:  Adult Children, Spouse Lives with:  Spouse Do you feel safe going back to the place where you live?  No Need for family participation in patient care:  Yes (Comment) (decision making at this time)  Care giving concerns:  Pt lives at home with spouse- was independent with most mobility prior to admission and pt spouse is not able to provide current level of assist needed.   Social Worker assessment / plan:  CSW spoke with pt and wife regarding PT recommendation for SNF.  Patient has been to SNF in the past so they are familiar with the process.  Employment status:  Retired Nurse, adult PT Recommendations:  [place] / Referral to community resources:  [place]  Patient/Family's Response to care:  Agreeable to SNF if pt does not improve enough to go home when stable for DC.  Preference expressed for [facility] where pt has been in the past and is close to home.  Patient/Family's Understanding of and Emotional Response to Diagnosis, Current Treatment, and Prognosis:  Family expresses good understanding of patient needs but is anxious about the state of his  infection and how long it will take to clear.  Emotional Assessment Appearance:  Appears stated age Attitude/Demeanor/Rapport:  Unable to Assess Affect (typically observed):  Unable to Assess Orientation:  Oriented to Self Alcohol / Substance use:  Not Applicable Psych involvement (Current and /or in the community):  No (Comment)  Discharge Needs  Concerns to be addressed:  Care Coordination Readmission within the last 30 days:  No Current discharge risk:  Physical Impairment Barriers to Discharge:  Continued Medical Work up   [employer], LCSW 11/15/2016, 1:32 PM

## 2016-11-16 DIAGNOSIS — I471 Supraventricular tachycardia: Secondary | ICD-10-CM

## 2016-11-16 LAB — BASIC METABOLIC PANEL
ANION GAP: 10 (ref 5–15)
BUN: 55 mg/dL — ABNORMAL HIGH (ref 6–20)
CALCIUM: 8.3 mg/dL — AB (ref 8.9–10.3)
CO2: 13 mmol/L — AB (ref 22–32)
Chloride: 123 mmol/L — ABNORMAL HIGH (ref 101–111)
Creatinine, Ser: 1.6 mg/dL — ABNORMAL HIGH (ref 0.61–1.24)
GFR, EST AFRICAN AMERICAN: 42 mL/min — AB (ref >60)
GFR, EST NON AFRICAN AMERICAN: 37 mL/min — AB (ref >60)
GLUCOSE: 98 mg/dL (ref 65–99)
POTASSIUM: 4.1 mmol/L (ref 3.5–5.1)
Sodium: 146 mmol/L — ABNORMAL HIGH (ref 135–145)

## 2016-11-16 LAB — CBC
HEMATOCRIT: 33.7 % — AB (ref 39.0–52.0)
HEMOGLOBIN: 10.8 g/dL — AB (ref 13.0–17.0)
MCH: 28.6 pg (ref 26.0–34.0)
MCHC: 32 g/dL (ref 30.0–36.0)
MCV: 89.2 fL (ref 78.0–100.0)
Platelets: 146 10*3/uL — ABNORMAL LOW (ref 150–400)
RBC: 3.78 MIL/uL — AB (ref 4.22–5.81)
RDW: 15.4 % (ref 11.5–15.5)
WBC: 11 10*3/uL — ABNORMAL HIGH (ref 4.0–10.5)

## 2016-11-16 MED ORDER — LEVALBUTEROL HCL 1.25 MG/0.5ML IN NEBU
1.2500 mg | INHALATION_SOLUTION | Freq: Two times a day (BID) | RESPIRATORY_TRACT | Status: DC
  Administered 2016-11-16 – 2016-11-20 (×8): 1.25 mg via RESPIRATORY_TRACT
  Filled 2016-11-16 (×7): qty 0.5

## 2016-11-16 MED ORDER — SODIUM CHLORIDE 0.9 % IN NEBU
INHALATION_SOLUTION | RESPIRATORY_TRACT | Status: AC
  Administered 2016-11-16: 08:00:00
  Filled 2016-11-16: qty 3

## 2016-11-16 MED ORDER — DEXTROSE 5 % IV SOLN
INTRAVENOUS | Status: DC
  Administered 2016-11-16: 12:00:00 via INTRAVENOUS

## 2016-11-16 NOTE — Progress Notes (Signed)
[place] TEAM 1 - Stepdown/ICU TEAM  Austin Rose  DSK:[id number] DOB: Dec 26, 1925 DOA: 11/11/2016 PCP: Burnis Medin, MD    Brief Narrative:  83yo M w/ a hx of Orthostatic Hypotension on Midodrine, Aortic Stenosis, Pulmonary HTN, remote CAD/CABG, arthritis, HLD and Cancer who was sent to the ER after developing progressive confusion (did not recognize family members)in the setting of persistent fevers. He was not speaking but was moving all extremitis. He was incontinent of a large amount of dark colored strong smelling urine.  In the ER chest x-ray was unremarkable, rectal temperature was 620.3, systolic blood pressure in the 90-110 range, room air saturations were 97%. He was found to have acute kidney injury with mild metabolic acidosis, lactic acid was elevated at 2.19, troponin mildly elevated at 0.09 with subtle ST segment changes in leads V2 through V5. He was also found to have mild thrombocytopenia.  CT head unremarkable.  Subjective: Pt is alert and conversant, but confused.  He can not provide a reliable hx.  He is in no apparent resp distress, and does not appear to be suffering w/ uncontrolled pain.    Assessment & Plan:   Severe sepsis due to Escherichia coli UTI w/ bacteremia will need a full 14 days of tx given bacteremia - cont rocephin   Acute delirium - acute metabolic encephalopathy Most c/w TME in setting of severe sepsis - waxing and waning - cont QHS seroquel - avoid benzos if able   Hypernatremia - hypochloridemia Increase free water via IV and follow   Thrombocytopenia Due to gram neg rod bacteremia - following trend   Acute kidney injury Likely ATN due to severe sepsis - slowly improving - keep hydrated   Recent Labs Lab 11/11/16 1333 11/12/16 0740 11/14/16 0631 11/15/16 0306 11/16/16 0309  CREATININE 2.44* 2.40* 1.73* 5.59* 7.41*    Systolic congestive heart failure No clinical evidence on exam of volume overload - appears euvolemic at  this time - follow Is/Os and daily weights Filed Weights   11/14/16 0300 11/15/16 0348 11/16/16 0500  Weight: 79.1 kg (174 lb 6.1 oz) 80.4 kg (177 lb 4 oz) 78.8 kg (173 lb 11.6 oz)    Aortic stenosis - bicuspid AoV Will complicate volume management and predispose pt to quickly shift from [facility] to volume overload - following Is/Os   Coronary artery disease status post CABG - mildly elevated troponin Felt to represent demand ischemia - trop peaked at 7.64 - not a candidate for aggressive intervention per Cardiology - cont medical tx   Chronic LBBB w/ intermittent SVT  Chronic orthostatic hypotension cont midodrine   Chronic arthritis  Hypokalemia  Corrected w/ replacement   HLD  DVT prophylaxis: SCDs Code Status: FULL CODE Family Communication: no family present at time of exam  Disposition Plan: transfer to tele bed - PT/OT - follow mental status   Consultants:  Cardiology   Procedures: 7/31 TTE - EF 40-45 percent with mild diffuse hypokinesis - mild to moderate aortic stenosis - poor study due to tachycardia and agitation   Antimicrobials:  Aztreonam 7/30 > 7/31 Rocephin 8/1 >  Objective: Blood pressure (!) 123/97, pulse (!) 53, temperature (!) 97.5 F (36.4 C), temperature source Oral, resp. rate 20, height 5\' 9"  (1.753 m), weight 78.8 kg (173 lb 11.6 oz), SpO2 100 %.  Intake/Output Summary (Last 24 hours) at 11/16/16 1042 Last data filed at 11/16/16 0710  Gross per 24 hour  Intake  2412.5 ml  Output              975 ml  Net           1437.5 ml   Filed Weights   11/14/16 0300 11/15/16 0348 11/16/16 0500  Weight: 79.1 kg (174 lb 6.1 oz) 80.4 kg (177 lb 4 oz) 78.8 kg (173 lb 11.6 oz)    Examination: General: No acute respiratory distress - confused but pleasant  Lungs: CTA w/ no wheezing or focal crackles  Cardiovascular: RRR w/o gallup or M  Abdomen: Nontender, nondistended, soft, bowel sounds positive, no rebound, no ascited Extremities: No edema B  LE   CBC:  Recent Labs Lab 11/11/16 1333 11/12/16 0740 11/14/16 0631 11/15/16 0306 11/16/16 0309  WBC 8.0 6.8 12.0* 12.6* 11.0*  NEUTROABS 7.2  --   --   --   --   HGB 11.8* 10.5* 10.3* 10.3* 10.8*  HCT 35.3* 30.5* 30.5* 31.1* 33.7*  MCV 88.7 88.4 86.4 87.9 89.2  PLT 129* 81* 155 173 761*   Basic Metabolic Panel:  Recent Labs Lab 11/11/16 1333 11/12/16 0052 11/12/16 0740 11/14/16 0631 11/14/16 1125 11/14/16 2038 11/15/16 0306 11/16/16 0309  NA 136  --  137 140  --   --  143 146*  K 4.1  --  3.4* 3.2* 3.1* 3.6 4.2 4.1  CL 104  --  114* 119*  --   --  119* 123*  CO2 20*  --  14* 13*  --   --  16* 13*  GLUCOSE 114*  --  111* 138*  --   --  104* 98  BUN 50*  --  48* 57*  --   --  59* 55*  CREATININE 2.44*  --  2.40* 1.73*  --   --  1.72* 1.60*  CALCIUM 9.0  --  7.6* 8.3*  --   --  8.4* 8.3*  MG  --  1.8  --   --  2.1 2.1 2.1  --    GFR: Estimated Creatinine Clearance: 31.3 mL/min (A) (by C-G formula based on SCr of 1.6 mg/dL (H)).  Liver Function Tests:  Recent Labs Lab 11/11/16 1333 11/12/16 0740 11/14/16 0631  AST 31 50* 58*  ALT 15* 18 33  ALKPHOS 75 72 59  BILITOT 1.0 0.6 0.4  PROT 6.1* 4.8* 5.1*  ALBUMIN 2.9* 2.2* 2.2*    Cardiac Enzymes:  Recent Labs Lab 11/12/16 0052 11/12/16 0740 11/12/16 1351 11/12/16 1911 11/15/16 0306  TROPONINI 2.49* 5.58* 7.64* 6.32* 3.13*    HbA1C: Hgb A1c MFr Bld  Date/Time Value Ref Range Status  04/18/2016 07:21 AM 5.1 4.8 - 5.6 % Final    Comment:    (NOTE)         Pre-diabetes: 5.7 - 6.4         Diabetes: >6.4         Glycemic control for adults with diabetes: <7.0     Recent Results (from the past 240 hour(s))  Culture, blood (Routine X 2) w Reflex to ID Panel     Status: Abnormal   Collection Time: 11/11/16  2:15 PM  Result Value Ref Range Status   Specimen Description BLOOD LEFT ANTECUBITAL  Final   Special Requests   Final    BOTTLES DRAWN AEROBIC AND ANAEROBIC Blood Culture adequate volume    Culture  Setup Time   Final    GRAM NEGATIVE RODS IN BOTH AEROBIC AND ANAEROBIC BOTTLES CRITICAL RESULT CALLED TO,  READ BACK BY AND VERIFIED WITH: Alona Bene PHARMD 0304 11/12/16 A BROWNING    Culture ESCHERICHIA COLI (A)  Final   Report Status 11/14/2016 FINAL  Final   Organism ID, Bacteria ESCHERICHIA COLI  Final      Susceptibility   Escherichia coli - MIC*    AMPICILLIN 8 SENSITIVE Sensitive     CEFAZOLIN <=4 SENSITIVE Sensitive     CEFEPIME <=1 SENSITIVE Sensitive     CEFTAZIDIME <=1 SENSITIVE Sensitive     CEFTRIAXONE <=1 SENSITIVE Sensitive     CIPROFLOXACIN <=0.25 SENSITIVE Sensitive     GENTAMICIN <=1 SENSITIVE Sensitive     IMIPENEM <=0.25 SENSITIVE Sensitive     TRIMETH/SULFA <=20 SENSITIVE Sensitive     AMPICILLIN/SULBACTAM <=2 SENSITIVE Sensitive     PIP/TAZO <=4 SENSITIVE Sensitive     Extended ESBL NEGATIVE Sensitive     * ESCHERICHIA COLI  Blood Culture ID Panel (Reflexed)     Status: Abnormal   Collection Time: 11/11/16  2:15 PM  Result Value Ref Range Status   Enterococcus species NOT DETECTED NOT DETECTED Final   Listeria monocytogenes NOT DETECTED NOT DETECTED Final   Staphylococcus species NOT DETECTED NOT DETECTED Final   Staphylococcus aureus NOT DETECTED NOT DETECTED Final   Streptococcus species NOT DETECTED NOT DETECTED Final   Streptococcus agalactiae NOT DETECTED NOT DETECTED Final   Streptococcus pneumoniae NOT DETECTED NOT DETECTED Final   Streptococcus pyogenes NOT DETECTED NOT DETECTED Final   Acinetobacter baumannii NOT DETECTED NOT DETECTED Final   Enterobacteriaceae species DETECTED (A) NOT DETECTED Final    Comment: Enterobacteriaceae represent a large family of gram-negative bacteria, not a single organism. CRITICAL RESULT CALLED TO, READ BACK BY AND VERIFIED WITH: V BRYK PHARMD 0304 11/12/16 A BROWNING    Enterobacter cloacae complex NOT DETECTED NOT DETECTED Final   Escherichia coli DETECTED (A) NOT DETECTED Final    Comment: CRITICAL RESULT  CALLED TO, READ BACK BY AND VERIFIED WITH: V BRYK PHARMD 0304 11/12/16 A BROWNING    Klebsiella oxytoca NOT DETECTED NOT DETECTED Final   Klebsiella pneumoniae NOT DETECTED NOT DETECTED Final   Proteus species NOT DETECTED NOT DETECTED Final   Serratia marcescens NOT DETECTED NOT DETECTED Final   Carbapenem resistance NOT DETECTED NOT DETECTED Final   Haemophilus influenzae NOT DETECTED NOT DETECTED Final   Neisseria meningitidis NOT DETECTED NOT DETECTED Final   Pseudomonas aeruginosa NOT DETECTED NOT DETECTED Final   Candida albicans NOT DETECTED NOT DETECTED Final   Candida glabrata NOT DETECTED NOT DETECTED Final   Candida krusei NOT DETECTED NOT DETECTED Final   Candida parapsilosis NOT DETECTED NOT DETECTED Final   Candida tropicalis NOT DETECTED NOT DETECTED Final  Culture, blood (Routine X 2) w Reflex to ID Panel     Status: Abnormal   Collection Time: 11/11/16  2:58 PM  Result Value Ref Range Status   Specimen Description BLOOD RIGHT ANTECUBITAL  Final   Special Requests   Final    BOTTLES DRAWN AEROBIC AND ANAEROBIC Blood Culture adequate volume   Culture  Setup Time   Final    GRAM NEGATIVE RODS IN BOTH AEROBIC AND ANAEROBIC BOTTLES CRITICAL VALUE NOTED.  VALUE IS CONSISTENT WITH PREVIOUSLY REPORTED AND CALLED VALUE.    Culture (A)  Final    ESCHERICHIA COLI SUSCEPTIBILITIES PERFORMED ON PREVIOUS CULTURE WITHIN THE LAST 5 DAYS.    Report Status 11/14/2016 FINAL  Final  Urine Culture     Status: Abnormal   Collection Time: 11/11/16  3:10 PM  Result Value Ref Range Status   Specimen Description URINE, RANDOM  Final   Special Requests NONE  Final   Culture >=100,000 COLONIES/mL ESCHERICHIA COLI (A)  Final   Report Status 11/14/2016 FINAL  Final   Organism ID, Bacteria ESCHERICHIA COLI (A)  Final      Susceptibility   Escherichia coli - MIC*    AMPICILLIN 8 SENSITIVE Sensitive     CEFAZOLIN <=4 SENSITIVE Sensitive     CEFTRIAXONE <=1 SENSITIVE Sensitive      CIPROFLOXACIN <=0.25 SENSITIVE Sensitive     GENTAMICIN <=1 SENSITIVE Sensitive     IMIPENEM <=0.25 SENSITIVE Sensitive     NITROFURANTOIN <=16 SENSITIVE Sensitive     TRIMETH/SULFA <=20 SENSITIVE Sensitive     AMPICILLIN/SULBACTAM <=2 SENSITIVE Sensitive     PIP/TAZO <=4 SENSITIVE Sensitive     Extended ESBL NEGATIVE Sensitive     * >=100,000 COLONIES/mL ESCHERICHIA COLI  Respiratory Panel by PCR     Status: None   Collection Time: 11/12/16  3:37 AM  Result Value Ref Range Status   Adenovirus NOT DETECTED NOT DETECTED Final   Coronavirus 229E NOT DETECTED NOT DETECTED Final   Coronavirus HKU1 NOT DETECTED NOT DETECTED Final   Coronavirus NL63 NOT DETECTED NOT DETECTED Final   Coronavirus OC43 NOT DETECTED NOT DETECTED Final   Metapneumovirus NOT DETECTED NOT DETECTED Final   Rhinovirus / Enterovirus NOT DETECTED NOT DETECTED Final   Influenza A NOT DETECTED NOT DETECTED Final   Influenza B NOT DETECTED NOT DETECTED Final   Parainfluenza Virus 1 NOT DETECTED NOT DETECTED Final   Parainfluenza Virus 2 NOT DETECTED NOT DETECTED Final   Parainfluenza Virus 3 NOT DETECTED NOT DETECTED Final   Parainfluenza Virus 4 NOT DETECTED NOT DETECTED Final   Respiratory Syncytial Virus NOT DETECTED NOT DETECTED Final   Bordetella pertussis NOT DETECTED NOT DETECTED Final   Chlamydophila pneumoniae NOT DETECTED NOT DETECTED Final   Mycoplasma pneumoniae NOT DETECTED NOT DETECTED Final  MRSA PCR Screening     Status: None   Collection Time: 11/12/16  3:37 AM  Result Value Ref Range Status   MRSA by PCR NEGATIVE NEGATIVE Final    Comment:        The GeneXpert MRSA Assay (FDA approved for NASAL specimens only), is one component of a comprehensive MRSA colonization surveillance program. It is not intended to diagnose MRSA infection nor to guide or monitor treatment for MRSA infections.      Scheduled Meds: . chlorhexidine  15 mL Mouth Rinse BID  . levalbuterol  1.25 mg Nebulization BID    . mouth rinse  15 mL Mouth Rinse q12n4p  . metoprolol tartrate  10 mg Intravenous Q6H  . midodrine  5 mg Oral 3 times per day  . QUEtiapine  12.5 mg Oral QHS  . sodium chloride flush  3 mL Intravenous Q12H  . vitamin B-12  1,000 mcg Oral Daily     LOS: 5 days   Cherene Altes, MD [facility] Office  [phone] Pager - Text Page per Amion as per below:  On-Call/Text Page:      [email or web address]      password [email or web address]  If 7PM-7AM, please contact night-coverage [email or web address] Password [email or web address] 11/16/2016, 10:42 AM

## 2016-11-16 NOTE — Progress Notes (Signed)
Received patient from 3W, bil mittens on, lethargic, will briefly open his eyes to verbal stimulus. Not in respiratory distress. Agree with previous RN's assessment. Will monitor accordingly.

## 2016-11-16 NOTE — Progress Notes (Signed)
  Speech Language Pathology Treatment: Dysphagia  Patient Details Name: Austin Rose MRN: [id number] DOB: 01/08/1926 Today's Date: 11/16/2016 Time: 1031-2811 SLP Time Calculation (min) (ACUTE ONLY): 10 min  Assessment / Plan / Recommendation Clinical Impression  Patient seen for dysphagia treatment and attempt to assess solids. Pt remains lethargic, briefly opens eyes to verbal, tactile stimulus. SLP repositioned and attempted oral care to facilitate arousal, however pt remains lethargic, does not respond or make efforts to retrieve ice chip placed to lips. Opens eyes briefly when presented with straw and takes sips of thin liquids with no overt signs of aspiration. No solids attempted given his level of alertness. Recommend continuing clear liquids only with medications whole in puree,full supervision and continue to hold PO when pt lethargic. SLP will f/u next date to determine readiness for solids.    HPI HPI: Patient is a 83 y/o male who was admitted with urosepsis, fever, hypotension, tachycardia and acute renal failure. + UTI. PMH includes HTN, HLD, CAD with CABG, orthostatic hypotension, back surgery, TIA.      SLP Plan  Continue with current plan of care       Recommendations  Diet recommendations: Thin liquid Medication Administration: Whole meds with puree Supervision: Full supervision/cueing for compensatory strategies Compensations:  (Hold PO when lethargic)                Oral Care Recommendations: Oral care BID SLP Visit Diagnosis: Dysphagia, unspecified (R13.10) Plan: Continue with current plan of care       [place], [place], [place] Speech-Language Pathologist [phone]   Aliene Altes 11/16/2016, 10:21 AM

## 2016-11-16 NOTE — Progress Notes (Signed)
Pt not alert enough to take PO meds this AM.

## 2016-11-16 NOTE — Progress Notes (Signed)
Patients wife Estill Bamberg called at # in chart to inform of transfer. Apology offered to wife for not notifying sooner. Estill Bamberg gives # [phone] to reach daughter Jackelyn Poling. Apology offered to [facility] for same.

## 2016-11-16 NOTE — Progress Notes (Signed)
Progress Note  Patient Name: Austin Rose Date of Encounter: 11/16/2016  Primary Cardiologist: Dr. Claiborne Billings  Subjective   Confused but calm. No complaints.   Inpatient Medications    Scheduled Meds: . chlorhexidine  15 mL Mouth Rinse BID  . levalbuterol  1.25 mg Nebulization BID  . mouth rinse  15 mL Mouth Rinse q12n4p  . metoprolol tartrate  10 mg Intravenous Q6H  . midodrine  5 mg Oral 3 times per day  . QUEtiapine  12.5 mg Oral QHS  . sodium chloride flush  3 mL Intravenous Q12H  . vitamin B-12  1,000 mcg Oral Daily   Continuous Infusions: . cefTRIAXone (ROCEPHIN)  IV Stopped (11/15/16 1745)  . dextrose 75 mL/hr at 11/16/16 1203   PRN Meds: acetaminophen **OR** acetaminophen, levalbuterol, ondansetron **OR** ondansetron (ZOFRAN) IV   Vital Signs    Vitals:   11/16/16 0800 11/16/16 0900 11/16/16 1000 11/16/16 1132  BP: (!) 117/94 120/81 (!) 123/97 125/78  Pulse: (!) 49 73 (!) 53 (!) 104  Resp: 19 15 20 19   Temp:    98 F (36.7 C)  TempSrc:    Oral  SpO2: 100% 98% 100% 100%  Weight:      Height:        Intake/Output Summary (Last 24 hours) at 11/16/16 1247 Last data filed at 11/16/16 0710  Gross per 24 hour  Intake           2412.5 ml  Output              975 ml  Net           1437.5 ml   Filed Weights   11/14/16 0300 11/15/16 0348 11/16/16 0500  Weight: 174 lb 6.1 oz (79.1 kg) 177 lb 4 oz (80.4 kg) 173 lb 11.6 oz (78.8 kg)    Telemetry    nsr - Personally Reviewed  ECG    none - Personally Reviewed  Physical Exam   GEN: No acute distress.  Not conversant Neck: 6 cm JVD Cardiac: RRR, no murmurs, rubs, or gallops.  Respiratory: Clear to auscultation bilaterally. GI: Soft, nontender, non-distended  MS: No edema; No deformity. Neuro:  does not answer questions but moves extremities Psych: unable to assess  Labs    Chemistry Recent Labs Lab 11/11/16 1333 11/12/16 0740 11/14/16 0631  11/14/16 2038 11/15/16 0306 11/16/16 0309  NA  136 137 140  --   --  143 146*  K 4.1 3.4* 3.2*  < > 3.6 4.2 4.1  CL 104 114* 119*  --   --  119* 123*  CO2 20* 14* 13*  --   --  16* 13*  GLUCOSE 114* 111* 138*  --   --  104* 98  BUN 50* 48* 57*  --   --  59* 55*  CREATININE 2.44* 2.40* 1.73*  --   --  1.72* 1.60*  CALCIUM 9.0 7.6* 8.3*  --   --  8.4* 8.3*  PROT 6.1* 4.8* 5.1*  --   --   --   --   ALBUMIN 2.9* 2.2* 2.2*  --   --   --   --   AST 31 50* 58*  --   --   --   --   ALT 15* 18 33  --   --   --   --   ALKPHOS 75 72 59  --   --   --   --   BILITOT 1.0  0.6 0.4  --   --   --   --   GFRNONAA 22* 22* 33*  --   --  33* 37*  GFRAA 25* 26* 39*  --   --  39* 42*  ANIONGAP 12 9 8   --   --  8 10  < > = values in this interval not displayed.   Hematology Recent Labs Lab 11/14/16 0631 11/15/16 0306 11/16/16 0309  WBC 12.0* 12.6* 11.0*  RBC 3.53* 3.54* 3.78*  HGB 10.3* 10.3* 10.8*  HCT 30.5* 31.1* 33.7*  MCV 86.4 87.9 89.2  MCH 29.2 29.1 28.6  MCHC 33.8 33.1 32.0  RDW 15.1 15.4 15.4  PLT 155 173 146*    Cardiac Enzymes Recent Labs Lab 11/12/16 0740 11/12/16 1351 11/12/16 1911 11/15/16 0306  TROPONINI 5.58* 7.64* 6.32* 3.13*   No results for input(s): TROPIPOC in the last 168 hours.   BNPNo results for input(s): BNP, PROBNP in the last 168 hours.   DDimer No results for input(s): DDIMER in the last 168 hours.   Radiology    No results found.  Cardiac Studies   none  Patient Profile     83 y.o. male admitted with sepsis and noted to have atrial tachy and probable atrial fib, now rhythm better though neuro status is not back to baseline.  Assessment & Plan    1. Atrial tachy - continue low dose beta blocker as bp allows. 2. Elevated troponin - thought to be a supply demand issue. No additional treatment at this time. 3. Aortic stenosis - valve is not tight. Continue current meds.  4. LV dysfunction - this is new and may well be due to urosepsis. Should get better with time.  Signed, Cristopher Peru, MD    11/16/2016, 12:47 PM  Patient ID: [name], male   DOB: 28-Jan-1926, 83 y.o.   MRN: [id number]

## 2016-11-16 NOTE — Progress Notes (Signed)
Attempted report x1 to 3E  1246 spoke with charge RN from unit 3E in regards to patient appropriateness for floor  1320 report given to RN Shirlee Limerick on 3E

## 2016-11-17 ENCOUNTER — Encounter (HOSPITAL_COMMUNITY): Payer: Self-pay | Admitting: *Deleted

## 2016-11-17 DIAGNOSIS — I519 Heart disease, unspecified: Secondary | ICD-10-CM

## 2016-11-17 LAB — BASIC METABOLIC PANEL
Anion gap: 7 (ref 5–15)
BUN: 44 mg/dL — AB (ref 6–20)
CALCIUM: 8.3 mg/dL — AB (ref 8.9–10.3)
CO2: 18 mmol/L — AB (ref 22–32)
CREATININE: 1.42 mg/dL — AB (ref 0.61–1.24)
Chloride: 119 mmol/L — ABNORMAL HIGH (ref 101–111)
GFR calc non Af Amer: 42 mL/min — ABNORMAL LOW (ref >60)
GFR, EST AFRICAN AMERICAN: 49 mL/min — AB (ref >60)
Glucose, Bld: 98 mg/dL (ref 65–99)
Potassium: 4.5 mmol/L (ref 3.5–5.1)
SODIUM: 144 mmol/L (ref 135–145)

## 2016-11-17 LAB — CBC
HCT: 34 % — ABNORMAL LOW (ref 39.0–52.0)
Hemoglobin: 10.9 g/dL — ABNORMAL LOW (ref 13.0–17.0)
MCH: 28.7 pg (ref 26.0–34.0)
MCHC: 32.1 g/dL (ref 30.0–36.0)
MCV: 89.5 fL (ref 78.0–100.0)
PLATELETS: 144 10*3/uL — AB (ref 150–400)
RBC: 3.8 MIL/uL — AB (ref 4.22–5.81)
RDW: 15.4 % (ref 11.5–15.5)
WBC: 13.3 10*3/uL — AB (ref 4.0–10.5)

## 2016-11-17 MED ORDER — CEFAZOLIN SODIUM-DEXTROSE 1-4 GM/50ML-% IV SOLN
1.0000 g | Freq: Two times a day (BID) | INTRAVENOUS | Status: DC
  Administered 2016-11-17: 1 g via INTRAVENOUS
  Filled 2016-11-17: qty 50

## 2016-11-17 MED ORDER — CEFAZOLIN SODIUM-DEXTROSE 2-4 GM/100ML-% IV SOLN
2.0000 g | Freq: Two times a day (BID) | INTRAVENOUS | Status: DC
  Administered 2016-11-18: 2 g via INTRAVENOUS
  Filled 2016-11-17 (×2): qty 100

## 2016-11-17 MED ORDER — ENOXAPARIN SODIUM 40 MG/0.4ML ~~LOC~~ SOLN
40.0000 mg | SUBCUTANEOUS | Status: DC
  Administered 2016-11-17 – 2016-11-19 (×3): 40 mg via SUBCUTANEOUS
  Filled 2016-11-17 (×3): qty 0.4

## 2016-11-17 NOTE — Progress Notes (Signed)
PHARMACY NOTE:  ANTIMICROBIAL RENAL DOSAGE ADJUSTMENT  Current antimicrobial regimen includes a mismatch between antimicrobial dosage and estimated renal function.  As per policy approved by the Pharmacy & Therapeutics and Medical Executive Committees, the antimicrobial dosage will be adjusted accordingly.  Current antimicrobial dosage:  Cefazolin 1 gm every 12 hours  Indication: E. Coli Bacteremia/UTI  Renal Function:  Estimated Creatinine Clearance: 32.9 mL/min (A) (by C-G formula based on SCr of 1.42 mg/dL (H)). []      On intermittent HD, scheduled: []      On CRRT    Antimicrobial dosage has been changed to: Cefazolin 2 gm every 12 hours given bloodstream involvement   Additional comments: Pharmacy will monitor closely and adjust dose as indicated   Thank you for allowing pharmacy to be a part of this patient's care.   Susa Raring, PharmD PGY2 Infectious Diseases Pharmacy Resident Pager: [phone]  11/17/2016 2:53 PM

## 2016-11-17 NOTE — Progress Notes (Addendum)
  Speech Language Pathology Treatment: Dysphagia  Patient Details Name: TOMER CHALMERS MRN: [id number] DOB: 22-Nov-1925 Today's Date: 11/17/2016 Time: 1220-1230 SLP Time Calculation (min) (ACUTE ONLY): 10 min  Assessment / Plan / Recommendation Clinical Impression  Pt seen for dysphagia treatment and to reattempt assessment of solids, which have not been assessed to date given pt's lethargy and inability to sustain attention/arousal to PO trials. Family present and state pt has been more alert today, however he is again lethargic upon this clinician's visit and requires consistent max cues for arousal. Upgraded per MD to soft diet; family reports pt ate some mashed potatoes earlier. SLP facilitated pt's alertness, arousal with verbal and tactile cues; he opened his eyes and responded verbally to basic questions. Attempted single bite of pureed solid; pt stated, "it's cold," noted with prolonged oral holding, change in alertness and attention, required max cues to initiate swallow and clear residue. Ceased PO trials due to pt's arousal. Provided education to family re: aspiration risk due to reduced arousal and attention. Recommended full supervision for PO, feeding only when alert, encouraging pt self-feeding as able, reducing distractions, ensuring oral clearance between bites. SLP will follow up for continued assessment for tolerance of solids. Pt remains at risk for aspiration due to lethargy, current mentation.    HPI HPI: Patient is a 83 y/o male who was admitted with urosepsis, fever, hypotension, tachycardia and acute renal failure. + UTI. PMH includes HTN, HLD, CAD with CABG, orthostatic hypotension, back surgery, TIA.      SLP Plan  Continue with current plan of care       Recommendations  Diet recommendations: Other(comment);Thin liquid (Upgraded to soft diet per MD) Liquids provided via: Cup;Straw Medication Administration: Whole meds with puree Supervision: Full supervision/cueing  for compensatory strategies Compensations: Slow rate;Small sips/bites;Minimize environmental distractions Postural Changes and/or Swallow Maneuvers: Seated upright 90 degrees                Oral Care Recommendations: Oral care BID Follow up Recommendations: Other (comment) (TBA) SLP Visit Diagnosis: Dysphagia, unspecified (R13.10) Plan: Continue with current plan of care       [place], Angelica, [place] Speech-Language Pathologist [phone]   Aliene Altes 11/17/2016, 1:04 PM

## 2016-11-17 NOTE — Progress Notes (Signed)
Progress Note  Patient Name: Austin Rose Date of Encounter: 11/17/2016  Primary Cardiologist: Dr. Claiborne Billings  Subjective   Feels well  Inpatient Medications    Scheduled Meds: . chlorhexidine  15 mL Mouth Rinse BID  . levalbuterol  1.25 mg Nebulization BID  . mouth rinse  15 mL Mouth Rinse q12n4p  . metoprolol tartrate  10 mg Intravenous Q6H  . midodrine  5 mg Oral 3 times per day  . sodium chloride flush  3 mL Intravenous Q12H  . vitamin B-12  1,000 mcg Oral Daily   Continuous Infusions: . cefTRIAXone (ROCEPHIN)  IV Stopped (11/16/16 1336)  . dextrose 75 mL/hr at 11/16/16 1203   PRN Meds: acetaminophen **OR** acetaminophen, levalbuterol, ondansetron **OR** ondansetron (ZOFRAN) IV   Vital Signs    Vitals:   11/16/16 2059 11/17/16 0444 11/17/16 0653 11/17/16 0830  BP: 129/82  (!) 153/84   Pulse:   66   Resp:   18   Temp:   97.8 F (36.6 C)   TempSrc:   Axillary   SpO2: 98%  98% 94%  Weight:  167 lb 5.3 oz (75.9 kg)    Height:        Intake/Output Summary (Last 24 hours) at 11/17/16 1226 Last data filed at 11/17/16 0653  Gross per 24 hour  Intake              475 ml  Output              800 ml  Net             -325 ml   Filed Weights   11/16/16 0500 11/16/16 1500 11/17/16 0444  Weight: 173 lb 11.6 oz (78.8 kg) 169 lb 15.6 oz (77.1 kg) 167 lb 5.3 oz (75.9 kg)    Telemetry    NSR, PACs - Personally Reviewed  ECG    None recent - Personally Reviewed  Physical Exam   GEN: No acute distress. frail  Neck: No JVD Cardiac: RRR, 2/6 systolic murmurs, no rubs, or gallops.  Respiratory: Clear to auscultation bilaterally. GI: Soft, nontender, non-distended  MS: No edema; No deformity. Neuro:  Nonfocal  Psych: Normal affect   Labs    Chemistry Recent Labs Lab 11/11/16 1333 11/12/16 0740 11/14/16 0631  11/15/16 0306 11/16/16 0309 11/17/16 0434  NA 136 137 140  --  143 146* 144  K 4.1 3.4* 3.2*  < > 4.2 4.1 4.5  CL 104 114* 119*  --  119* 123*  119*  CO2 20* 14* 13*  --  16* 13* 18*  GLUCOSE 114* 111* 138*  --  104* 98 98  BUN 50* 48* 57*  --  59* 55* 44*  CREATININE 2.44* 2.40* 1.73*  --  1.72* 1.60* 1.42*  CALCIUM 9.0 7.6* 8.3*  --  8.4* 8.3* 8.3*  PROT 6.1* 4.8* 5.1*  --   --   --   --   ALBUMIN 2.9* 2.2* 2.2*  --   --   --   --   AST 31 50* 58*  --   --   --   --   ALT 15* 18 33  --   --   --   --   ALKPHOS 75 72 59  --   --   --   --   BILITOT 1.0 0.6 0.4  --   --   --   --   GFRNONAA 22* 22* 33*  --  33*  37* 42*  GFRAA 25* 26* 39*  --  39* 42* 49*  ANIONGAP 12 9 8   --  8 10 7   < > = values in this interval not displayed.   Hematology Recent Labs Lab 11/15/16 0306 11/16/16 0309 11/17/16 0536  WBC 12.6* 11.0* 13.3*  RBC 3.54* 3.78* 3.80*  HGB 10.3* 10.8* 10.9*  HCT 31.1* 33.7* 34.0*  MCV 87.9 89.2 89.5  MCH 29.1 28.6 28.7  MCHC 33.1 32.0 32.1  RDW 15.4 15.4 15.4  PLT 173 146* 144*    Cardiac Enzymes Recent Labs Lab 11/12/16 0740 11/12/16 1351 11/12/16 1911 11/15/16 0306  TROPONINI 5.58* 7.64* 6.32* 3.13*   No results for input(s): TROPIPOC in the last 168 hours.   BNPNo results for input(s): BNP, PROBNP in the last 168 hours.   DDimer No results for input(s): DDIMER in the last 168 hours.   Radiology    No results found.  Cardiac Studies   EF 40-45%  Patient Profile     83 y.o. male with PAT  Assessment & Plan    Rhythm stable on beta blocker.  Can switch to oral metoprolol 25 mg BID, when tolerating PO mes.  AS: Cause of mild murmur but not significant.   Systolic heart failure: Resp status stable.  SignedLarae Grooms, MD  11/17/2016, 12:26 PM

## 2016-11-17 NOTE — Progress Notes (Signed)
PROGRESS NOTE    Austin Rose  TSV:[id number] DOB: 1926-02-15 DOA: 11/11/2016 PCP: Burnis Medin, MD    Brief Narrative: 83yo M w/ a hx of Orthostatic Hypotensionon Midodrine, Aortic Stenosis, Pulmonary HTN, remote CAD/CABG, arthritis, HLD and Cancer who was sent to the ER after developing progressive confusion (did not recognize family members)in the setting of persistent fevers. He was not speaking but was moving all extremitis. He was incontinent of a large amount of dark colored strong smelling urine. He was admitted for sepsis from UTI.    Assessment & Plan:   Principal Problem:   Sepsis ([place]) Active Problems:   Acute kidney injury ([place])   CAD (coronary artery disease)   Arthritis   Elevated troponin   Thrombocytopenia, idiopathic ([facility])   Orthostatic hypotension   Acute metabolic encephalopathy   Aortic stenosis, moderate w/ mild pulmonary HTN   Hyperlipemia   Pressure injury of skin   Atrial tachycardia ([facility])   Sepsis from E coli UTi and bacteremia:  Currently on IV rocephin to complete the course.    Acute metabolic encephalopathy:  Improving.  Family adamant about stopping the seroquel, though its at a very low dose.    Hypernatremia:  IV dextrose fluids on board, secondary to free water deficit.  Improving.  If sodium is better in am, will d.c fluids and encourage free water by mouth.   Mild thrombocytopenia:  Possibly from sepsis.  Monitor ,no signs of bleeding.   Acute kidney injury:  Improving with hydration. Possibly from ATN due to severe sepsis,    Chronic systolic heart failure:  Compensated,  Euvolemic.    CAD s/p CABG:  Mildly elevated troponin possibly from demand ischemia from sepsis. Cardiology on board and recommendations given.    Orthostatic hypotension:  Resume midodrine.      DVT prophylaxis: lovenox.  Code Status: (Full code) Family Communication: discussed with family at bedside.  Disposition Plan:pending further  eval   Consultants:   Cardiology.    Procedures: none.   Antimicrobials:rocephine since admission.  Cefazolin from 8/5  Subjective: No new complaints.   Objective: Vitals:   11/17/16 0444 11/17/16 0653 11/17/16 0830 11/17/16 1300  BP:  (!) 153/84  (!) 136/97  Pulse:  66  69  Resp:  18  18  Temp:  97.8 F (36.6 C)  98.4 F (36.9 C)  TempSrc:  Axillary  Axillary  SpO2:  98% 94% 95%  Weight: 75.9 kg (167 lb 5.3 oz)     Height:        Intake/Output Summary (Last 24 hours) at 11/17/16 1728 Last data filed at 11/17/16 1433  Gross per 24 hour  Intake           396.25 ml  Output             1500 ml  Net         -1103.75 ml   Filed Weights   11/16/16 0500 11/16/16 1500 11/17/16 0444  Weight: 78.8 kg (173 lb 11.6 oz) 77.1 kg (169 lb 15.6 oz) 75.9 kg (167 lb 5.3 oz)    Examination:  General exam: Appears calm and comfortable , responding to verbal cues.  Respiratory system: Clear to auscultation. Respiratory effort normal. Cardiovascular system: S1 & S2 heard, RRR. No JVD, murmurs, rubs, gallops or clicks. No pedal edema. Gastrointestinal system: Abdomen is nondistended, soft and nontender. No organomegaly or masses felt. Normal bowel sounds heard. Central nervous system: somnolent but responding to verbal cues.  Extremities:  Symmetric 5 x 5 power. Skin: No rashes, lesions or ulcers Psychiatry: Judgement and insight appear normal. Mood & affect appropriate.     Data Reviewed: I have personally reviewed following labs and imaging studies  CBC:  Recent Labs Lab 11/11/16 1333 11/12/16 0740 11/14/16 0631 11/15/16 0306 11/16/16 0309 11/17/16 0536  WBC 8.0 6.8 12.0* 12.6* 11.0* 13.3*  NEUTROABS 7.2  --   --   --   --   --   HGB 11.8* 10.5* 10.3* 10.3* 10.8* 10.9*  HCT 35.3* 30.5* 30.5* 31.1* 33.7* 34.0*  MCV 88.7 88.4 86.4 87.9 89.2 89.5  PLT 129* 81* 155 173 146* 240*   Basic Metabolic Panel:  Recent Labs Lab 11/12/16 0052 11/12/16 0740 11/14/16 0631  11/14/16 1125 11/14/16 2038 11/15/16 0306 11/16/16 0309 11/17/16 0434  NA  --  137 140  --   --  143 146* 144  K  --  3.4* 3.2* 3.1* 3.6 4.2 4.1 4.5  CL  --  114* 119*  --   --  119* 123* 119*  CO2  --  14* 13*  --   --  16* 13* 18*  GLUCOSE  --  111* 138*  --   --  104* 98 98  BUN  --  48* 57*  --   --  59* 55* 44*  CREATININE  --  2.40* 1.73*  --   --  1.72* 1.60* 1.42*  CALCIUM  --  7.6* 8.3*  --   --  8.4* 8.3* 8.3*  MG 1.8  --   --  2.1 2.1 2.1  --   --    GFR: Estimated Creatinine Clearance: 32.9 mL/min (A) (by C-G formula based on SCr of 1.42 mg/dL (H)). Liver Function Tests:  Recent Labs Lab 11/11/16 1333 11/12/16 0740 11/14/16 0631  AST 31 50* 58*  ALT 15* 18 33  ALKPHOS 75 72 59  BILITOT 1.0 0.6 0.4  PROT 6.1* 4.8* 5.1*  ALBUMIN 2.9* 2.2* 2.2*   No results for input(s): LIPASE, AMYLASE in the last 168 hours. No results for input(s): AMMONIA in the last 168 hours. Coagulation Profile: No results for input(s): INR, PROTIME in the last 168 hours. Cardiac Enzymes:  Recent Labs Lab 11/12/16 0052 11/12/16 0740 11/12/16 1351 11/12/16 1911 11/15/16 0306  TROPONINI 2.49* 5.58* 7.64* 6.32* 3.13*   BNP (last 3 results) No results for input(s): PROBNP in the last 8760 hours. HbA1C: No results for input(s): HGBA1C in the last 72 hours. CBG: No results for input(s): GLUCAP in the last 168 hours. Lipid Profile: No results for input(s): CHOL, HDL, LDLCALC, TRIG, CHOLHDL, LDLDIRECT in the last 72 hours. Thyroid Function Tests: No results for input(s): TSH, T4TOTAL, FREET4, T3FREE, THYROIDAB in the last 72 hours. Anemia Panel: No results for input(s): VITAMINB12, FOLATE, FERRITIN, TIBC, IRON, RETICCTPCT in the last 72 hours. Sepsis Labs:  Recent Labs Lab 11/11/16 1815  11/11/16 2024 11/12/16 0326 11/12/16 0740 11/12/16 1004 11/13/16 0503  PROCALCITON 3.50  --   --   --   --   --  1.59  LATICACIDVEN 1.8  < > 5.1* 3.3* 1.9 1.7  --   < > = values in this  interval not displayed.  Recent Results (from the past 240 hour(s))  Culture, blood (Routine X 2) w Reflex to ID Panel     Status: Abnormal   Collection Time: 11/11/16  2:15 PM  Result Value Ref Range Status   Specimen Description BLOOD LEFT ANTECUBITAL  Final  Special Requests   Final    BOTTLES DRAWN AEROBIC AND ANAEROBIC Blood Culture adequate volume   Culture  Setup Time   Final    GRAM NEGATIVE RODS IN BOTH AEROBIC AND ANAEROBIC BOTTLES CRITICAL RESULT CALLED TO, READ BACK BY AND VERIFIED WITH: Alona Bene PHARMD 0304 11/12/16 A BROWNING    Culture ESCHERICHIA COLI (A)  Final   Report Status 11/14/2016 FINAL  Final   Organism ID, Bacteria ESCHERICHIA COLI  Final      Susceptibility   Escherichia coli - MIC*    AMPICILLIN 8 SENSITIVE Sensitive     CEFAZOLIN <=4 SENSITIVE Sensitive     CEFEPIME <=1 SENSITIVE Sensitive     CEFTAZIDIME <=1 SENSITIVE Sensitive     CEFTRIAXONE <=1 SENSITIVE Sensitive     CIPROFLOXACIN <=0.25 SENSITIVE Sensitive     GENTAMICIN <=1 SENSITIVE Sensitive     IMIPENEM <=0.25 SENSITIVE Sensitive     TRIMETH/SULFA <=20 SENSITIVE Sensitive     AMPICILLIN/SULBACTAM <=2 SENSITIVE Sensitive     PIP/TAZO <=4 SENSITIVE Sensitive     Extended ESBL NEGATIVE Sensitive     * ESCHERICHIA COLI  Blood Culture ID Panel (Reflexed)     Status: Abnormal   Collection Time: 11/11/16  2:15 PM  Result Value Ref Range Status   Enterococcus species NOT DETECTED NOT DETECTED Final   Listeria monocytogenes NOT DETECTED NOT DETECTED Final   Staphylococcus species NOT DETECTED NOT DETECTED Final   Staphylococcus aureus NOT DETECTED NOT DETECTED Final   Streptococcus species NOT DETECTED NOT DETECTED Final   Streptococcus agalactiae NOT DETECTED NOT DETECTED Final   Streptococcus pneumoniae NOT DETECTED NOT DETECTED Final   Streptococcus pyogenes NOT DETECTED NOT DETECTED Final   Acinetobacter baumannii NOT DETECTED NOT DETECTED Final   Enterobacteriaceae species DETECTED (A) NOT  DETECTED Final    Comment: Enterobacteriaceae represent a large family of gram-negative bacteria, not a single organism. CRITICAL RESULT CALLED TO, READ BACK BY AND VERIFIED WITH: V BRYK PHARMD 0304 11/12/16 A BROWNING    Enterobacter cloacae complex NOT DETECTED NOT DETECTED Final   Escherichia coli DETECTED (A) NOT DETECTED Final    Comment: CRITICAL RESULT CALLED TO, READ BACK BY AND VERIFIED WITH: V BRYK PHARMD 0304 11/12/16 A BROWNING    Klebsiella oxytoca NOT DETECTED NOT DETECTED Final   Klebsiella pneumoniae NOT DETECTED NOT DETECTED Final   Proteus species NOT DETECTED NOT DETECTED Final   Serratia marcescens NOT DETECTED NOT DETECTED Final   Carbapenem resistance NOT DETECTED NOT DETECTED Final   Haemophilus influenzae NOT DETECTED NOT DETECTED Final   Neisseria meningitidis NOT DETECTED NOT DETECTED Final   Pseudomonas aeruginosa NOT DETECTED NOT DETECTED Final   Candida albicans NOT DETECTED NOT DETECTED Final   Candida glabrata NOT DETECTED NOT DETECTED Final   Candida krusei NOT DETECTED NOT DETECTED Final   Candida parapsilosis NOT DETECTED NOT DETECTED Final   Candida tropicalis NOT DETECTED NOT DETECTED Final  Culture, blood (Routine X 2) w Reflex to ID Panel     Status: Abnormal   Collection Time: 11/11/16  2:58 PM  Result Value Ref Range Status   Specimen Description BLOOD RIGHT ANTECUBITAL  Final   Special Requests   Final    BOTTLES DRAWN AEROBIC AND ANAEROBIC Blood Culture adequate volume   Culture  Setup Time   Final    GRAM NEGATIVE RODS IN BOTH AEROBIC AND ANAEROBIC BOTTLES CRITICAL VALUE NOTED.  VALUE IS CONSISTENT WITH PREVIOUSLY REPORTED AND CALLED VALUE.  Culture (A)  Final    ESCHERICHIA COLI SUSCEPTIBILITIES PERFORMED ON PREVIOUS CULTURE WITHIN THE LAST 5 DAYS.    Report Status 11/14/2016 FINAL  Final  Urine Culture     Status: Abnormal   Collection Time: 11/11/16  3:10 PM  Result Value Ref Range Status   Specimen Description URINE, RANDOM   Final   Special Requests NONE  Final   Culture >=100,000 COLONIES/mL ESCHERICHIA COLI (A)  Final   Report Status 11/14/2016 FINAL  Final   Organism ID, Bacteria ESCHERICHIA COLI (A)  Final      Susceptibility   Escherichia coli - MIC*    AMPICILLIN 8 SENSITIVE Sensitive     CEFAZOLIN <=4 SENSITIVE Sensitive     CEFTRIAXONE <=1 SENSITIVE Sensitive     CIPROFLOXACIN <=0.25 SENSITIVE Sensitive     GENTAMICIN <=1 SENSITIVE Sensitive     IMIPENEM <=0.25 SENSITIVE Sensitive     NITROFURANTOIN <=16 SENSITIVE Sensitive     TRIMETH/SULFA <=20 SENSITIVE Sensitive     AMPICILLIN/SULBACTAM <=2 SENSITIVE Sensitive     PIP/TAZO <=4 SENSITIVE Sensitive     Extended ESBL NEGATIVE Sensitive     * >=100,000 COLONIES/mL ESCHERICHIA COLI  Respiratory Panel by PCR     Status: None   Collection Time: 11/12/16  3:37 AM  Result Value Ref Range Status   Adenovirus NOT DETECTED NOT DETECTED Final   Coronavirus 229E NOT DETECTED NOT DETECTED Final   Coronavirus HKU1 NOT DETECTED NOT DETECTED Final   Coronavirus NL63 NOT DETECTED NOT DETECTED Final   Coronavirus OC43 NOT DETECTED NOT DETECTED Final   Metapneumovirus NOT DETECTED NOT DETECTED Final   Rhinovirus / Enterovirus NOT DETECTED NOT DETECTED Final   Influenza A NOT DETECTED NOT DETECTED Final   Influenza B NOT DETECTED NOT DETECTED Final   Parainfluenza Virus 1 NOT DETECTED NOT DETECTED Final   Parainfluenza Virus 2 NOT DETECTED NOT DETECTED Final   Parainfluenza Virus 3 NOT DETECTED NOT DETECTED Final   Parainfluenza Virus 4 NOT DETECTED NOT DETECTED Final   Respiratory Syncytial Virus NOT DETECTED NOT DETECTED Final   Bordetella pertussis NOT DETECTED NOT DETECTED Final   Chlamydophila pneumoniae NOT DETECTED NOT DETECTED Final   Mycoplasma pneumoniae NOT DETECTED NOT DETECTED Final  MRSA PCR Screening     Status: None   Collection Time: 11/12/16  3:37 AM  Result Value Ref Range Status   MRSA by PCR NEGATIVE NEGATIVE Final    Comment:          The GeneXpert MRSA Assay (FDA approved for NASAL specimens only), is one component of a comprehensive MRSA colonization surveillance program. It is not intended to diagnose MRSA infection nor to guide or monitor treatment for MRSA infections.          Radiology Studies: No results found.      Scheduled Meds: . chlorhexidine  15 mL Mouth Rinse BID  . levalbuterol  1.25 mg Nebulization BID  . mouth rinse  15 mL Mouth Rinse q12n4p  . metoprolol tartrate  10 mg Intravenous Q6H  . midodrine  5 mg Oral 3 times per day  . sodium chloride flush  3 mL Intravenous Q12H  . vitamin B-12  1,000 mcg Oral Daily   Continuous Infusions: . [START ON 11/18/2016]  ceFAZolin (ANCEF) IV    . dextrose 75 mL/hr at 11/16/16 1203     LOS: 6 days    Time spent: 35 minutes.     Hosie Poisson, MD [facility] Pager [phone]  If 7PM-7AM, please contact night-coverage [email or web address] Password [email or web address] 11/17/2016, 5:28 PM

## 2016-11-18 LAB — CBC
HCT: 35.7 % — ABNORMAL LOW (ref 39.0–52.0)
Hemoglobin: 12 g/dL — ABNORMAL LOW (ref 13.0–17.0)
MCH: 30.4 pg (ref 26.0–34.0)
MCHC: 33.6 g/dL (ref 30.0–36.0)
MCV: 90.4 fL (ref 78.0–100.0)
Platelets: 155 10*3/uL (ref 150–400)
RBC: 3.95 MIL/uL — AB (ref 4.22–5.81)
RDW: 15.8 % — AB (ref 11.5–15.5)
WBC: 16.7 10*3/uL — AB (ref 4.0–10.5)

## 2016-11-18 LAB — BASIC METABOLIC PANEL
Anion gap: 10 (ref 5–15)
BUN: 33 mg/dL — AB (ref 6–20)
CALCIUM: 8.2 mg/dL — AB (ref 8.9–10.3)
CO2: 16 mmol/L — ABNORMAL LOW (ref 22–32)
CREATININE: 1.24 mg/dL (ref 0.61–1.24)
Chloride: 119 mmol/L — ABNORMAL HIGH (ref 101–111)
GFR calc non Af Amer: 50 mL/min — ABNORMAL LOW (ref >60)
GFR, EST AFRICAN AMERICAN: 58 mL/min — AB (ref >60)
Glucose, Bld: 82 mg/dL (ref 65–99)
Potassium: 3.6 mmol/L (ref 3.5–5.1)
SODIUM: 145 mmol/L (ref 135–145)

## 2016-11-18 MED ORDER — METOPROLOL TARTRATE 25 MG PO TABS
25.0000 mg | ORAL_TABLET | Freq: Two times a day (BID) | ORAL | Status: DC
Start: 2016-11-18 — End: 2016-11-20
  Administered 2016-11-18 – 2016-11-20 (×5): 25 mg via ORAL
  Filled 2016-11-18 (×6): qty 1

## 2016-11-18 MED ORDER — CEFAZOLIN SODIUM-DEXTROSE 2-4 GM/100ML-% IV SOLN
2.0000 g | Freq: Three times a day (TID) | INTRAVENOUS | Status: DC
  Administered 2016-11-18 – 2016-11-20 (×6): 2 g via INTRAVENOUS
  Filled 2016-11-18 (×12): qty 100

## 2016-11-18 NOTE — Progress Notes (Addendum)
Sharetta, NT attempted to feed pt breakfast this am, with son present in room.  Son, also, attempted to feed pt breakfast.  Pt refused breakfast, spitting food at son.  Pt became combative with NT, throwing fists.  Pt continues to remove condom catheter.  Mittens were placed on pt, however, he removes the mittens, as well.

## 2016-11-18 NOTE — Progress Notes (Signed)
PHARMACY NOTE:  ANTIMICROBIAL RENAL DOSAGE ADJUSTMENT  Current antimicrobial regimen includes a mismatch between antimicrobial dosage and estimated renal function.  As per policy approved by the Pharmacy & Therapeutics and Medical Executive Committees, the antimicrobial dosage will be adjusted accordingly.  Current antimicrobial dosage:  Cefazolin 2 gm every 12 hours  Indication: E. Coli Bacteremia/UTI  Renal Function:  Estimated Creatinine Clearance: 37.1 mL/min (by C-G formula based on SCr of 1.24 mg/dL). []      On intermittent HD, scheduled: []      On CRRT    Antimicrobial dosage has been changed to: Cefazolin 2 gm every 8 hours given bloodstream involvement   Additional comments: Pharmacy will monitor closely and adjust dose as indicated   Thank you for allowing pharmacy to be a part of this patient's care.    Mila Merry Gerarda Fraction, PharmD PGY1 Pharmacy Resident Pager: [phone]  11/18/2016 11:37 AM

## 2016-11-18 NOTE — Progress Notes (Signed)
PROGRESS NOTE    Austin Rose  PIR:[id number] DOB: [birth year] DOA: 11/11/2016 PCP: Burnis Medin, MD    Brief Narrative: 83yo M w/ a hx of Orthostatic Hypotensionon Midodrine, Aortic Stenosis, Pulmonary HTN, remote CAD/CABG, arthritis, HLD and Cancer who was sent to the ER after developing progressive confusion (did not recognize family members)in the setting of persistent fevers. He was not speaking but was moving all extremitis. He was incontinent of a large amount of dark colored strong smelling urine. He was admitted for sepsis from UTI.    Assessment & Plan:   Principal Problem:   Sepsis ([place]) Active Problems:   Acute kidney injury (HCC)   CAD (coronary artery disease)   Arthritis   Elevated troponin   Thrombocytopenia, idiopathic (HCC)   Orthostatic hypotension   Acute metabolic encephalopathy   Aortic stenosis, moderate w/ mild pulmonary HTN   Hyperlipemia   Pressure injury of skin   Atrial tachycardia (HCC)   Sepsis from E coli UTi and bacteremia:  Was on IV rocephin for the e coli bacteremia, transition to cefazolin. Plan to transition him to keflex to complete the course on discharge.  Improving.  Lactic acid normalized.     Acute metabolic encephalopathy:  Improving.  Family adamant about stopping the seroquel, though its at a very low dose. Stopped seroquel on 8/5 and he is more alert and fidgety.    Hypernatremia:  IV dextrose fluids on board, secondary to free water deficit. Repeat sodium at 145. D/c dextrose fluids.   Mild thrombocytopenia:  Possibly from sepsis. Improved.  Monitor ,no signs of bleeding.   Acute kidney injury:  Improving with hydration. Possibly from ATN due to severe sepsis, creatinine almost back to baseline.    Chronic systolic heart failure:  Compensated,  Euvolemic.    CAD s/p CABG:  Mildly elevated troponin possibly from demand ischemia from sepsis. Cardiology on board and recommendations given. Metoprolol added  for SVT.    Orthostatic hypotension:  Resume midodrine.      DVT prophylaxis: lovenox.  Code Status: (Full code) Family Communication: discussed with family at bedside.  Disposition Plan: snf when bed available , hopefully tomorrow.    Consultants:   Cardiology.    Procedures: none.   Antimicrobials:rocephine since admission.  Cefazolin from 8/5  Subjective: No new complaints. Is comfortable.   Objective: Vitals:   11/18/16 0300 11/18/16 0655 11/18/16 0741 11/18/16 1156  BP:  122/78  120/85  Pulse:  66  70  Resp:  18  18  Temp:  98.4 F (36.9 C)  98 F (36.7 C)  TempSrc:  Oral  Oral  SpO2:  99% 98% 98%  Weight: 73.5 kg (162 lb 0.6 oz)     Height:        Intake/Output Summary (Last 24 hours) at 11/18/16 1459 Last data filed at 11/18/16 0841  Gross per 24 hour  Intake              120 ml  Output              200 ml  Net              -80 ml   Filed Weights   11/16/16 1500 11/17/16 0444 11/18/16 0300  Weight: 77.1 kg (169 lb 15.6 oz) 75.9 kg (167 lb 5.3 oz) 73.5 kg (162 lb 0.6 oz)    Examination:  General exam: Appears calm and comfortable not in any distress.  Respiratory system: Clear to auscultation no  wheezing or rhonchi.  Cardiovascular system: S1 & S2 heard, RRR. No JVD, murmurs, rubs, gallops or clicks. No pedal edema. Gastrointestinal system: Abdomen is nondistended, soft and nontender. No organomegaly or masses felt. Normal bowel sounds heard. Central nervous system: alert but confused.  Extremities: Symmetric 5 x 5 power. Skin: No rashes, lesions or ulcers Psychiatry:  Mood & affect appropriate.     Data Reviewed: I have personally reviewed following labs and imaging studies  CBC:  Recent Labs Lab 11/14/16 0631 11/15/16 0306 11/16/16 0309 11/17/16 0536 11/18/16 0419  WBC 12.0* 12.6* 11.0* 13.3* 16.7*  HGB 10.3* 10.3* 10.8* 10.9* 12.0*  HCT 30.5* 31.1* 33.7* 34.0* 35.7*  MCV 86.4 87.9 89.2 89.5 90.4  PLT 155 173 146* 144* 564    Basic Metabolic Panel:  Recent Labs Lab 11/12/16 0052  11/14/16 0631 11/14/16 1125 11/14/16 2038 11/15/16 0306 11/16/16 0309 11/17/16 0434 11/18/16 0419  NA  --   < > 140  --   --  143 146* 144 145  K  --   < > 3.2* 3.1* 3.6 4.2 4.1 4.5 3.6  CL  --   < > 119*  --   --  119* 123* 119* 119*  CO2  --   < > 13*  --   --  16* 13* 18* 16*  GLUCOSE  --   < > 138*  --   --  104* 98 98 82  BUN  --   < > 57*  --   --  59* 55* 44* 33*  CREATININE  --   < > 1.73*  --   --  1.72* 1.60* 1.42* 1.24  CALCIUM  --   < > 8.3*  --   --  8.4* 8.3* 8.3* 8.2*  MG 1.8  --   --  2.1 2.1 2.1  --   --   --   < > = values in this interval not displayed. GFR: Estimated Creatinine Clearance: 37.1 mL/min (by C-G formula based on SCr of 1.24 mg/dL). Liver Function Tests:  Recent Labs Lab 11/12/16 0740 11/14/16 0631  AST 50* 58*  ALT 18 33  ALKPHOS 72 59  BILITOT 0.6 0.4  PROT 4.8* 5.1*  ALBUMIN 2.2* 2.2*   No results for input(s): LIPASE, AMYLASE in the last 168 hours. No results for input(s): AMMONIA in the last 168 hours. Coagulation Profile: No results for input(s): INR, PROTIME in the last 168 hours. Cardiac Enzymes:  Recent Labs Lab 11/12/16 0052 11/12/16 0740 11/12/16 1351 11/12/16 1911 11/15/16 0306  TROPONINI 2.49* 5.58* 7.64* 6.32* 3.13*   BNP (last 3 results) No results for input(s): PROBNP in the last 8760 hours. HbA1C: No results for input(s): HGBA1C in the last 72 hours. CBG: No results for input(s): GLUCAP in the last 168 hours. Lipid Profile: No results for input(s): CHOL, HDL, LDLCALC, TRIG, CHOLHDL, LDLDIRECT in the last 72 hours. Thyroid Function Tests: No results for input(s): TSH, T4TOTAL, FREET4, T3FREE, THYROIDAB in the last 72 hours. Anemia Panel: No results for input(s): VITAMINB12, FOLATE, FERRITIN, TIBC, IRON, RETICCTPCT in the last 72 hours. Sepsis Labs:  Recent Labs Lab 11/11/16 1815  11/11/16 2024 11/12/16 0326 11/12/16 0740 11/12/16 1004  11/13/16 0503  PROCALCITON 3.50  --   --   --   --   --  1.59  LATICACIDVEN 1.8  < > 5.1* 3.3* 1.9 1.7  --   < > = values in this interval not displayed.  Recent Results (from the  past 240 hour(s))  Culture, blood (Routine X 2) w Reflex to ID Panel     Status: Abnormal   Collection Time: 11/11/16  2:15 PM  Result Value Ref Range Status   Specimen Description BLOOD LEFT ANTECUBITAL  Final   Special Requests   Final    BOTTLES DRAWN AEROBIC AND ANAEROBIC Blood Culture adequate volume   Culture  Setup Time   Final    GRAM NEGATIVE RODS IN BOTH AEROBIC AND ANAEROBIC BOTTLES CRITICAL RESULT CALLED TO, READ BACK BY AND VERIFIED WITH: Alona Bene PHARMD 0304 11/12/16 A BROWNING    Culture ESCHERICHIA COLI (A)  Final   Report Status 11/14/2016 FINAL  Final   Organism ID, Bacteria ESCHERICHIA COLI  Final      Susceptibility   Escherichia coli - MIC*    AMPICILLIN 8 SENSITIVE Sensitive     CEFAZOLIN <=4 SENSITIVE Sensitive     CEFEPIME <=1 SENSITIVE Sensitive     CEFTAZIDIME <=1 SENSITIVE Sensitive     CEFTRIAXONE <=1 SENSITIVE Sensitive     CIPROFLOXACIN <=0.25 SENSITIVE Sensitive     GENTAMICIN <=1 SENSITIVE Sensitive     IMIPENEM <=0.25 SENSITIVE Sensitive     TRIMETH/SULFA <=20 SENSITIVE Sensitive     AMPICILLIN/SULBACTAM <=2 SENSITIVE Sensitive     PIP/TAZO <=4 SENSITIVE Sensitive     Extended ESBL NEGATIVE Sensitive     * ESCHERICHIA COLI  Blood Culture ID Panel (Reflexed)     Status: Abnormal   Collection Time: 11/11/16  2:15 PM  Result Value Ref Range Status   Enterococcus species NOT DETECTED NOT DETECTED Final   Listeria monocytogenes NOT DETECTED NOT DETECTED Final   Staphylococcus species NOT DETECTED NOT DETECTED Final   Staphylococcus aureus NOT DETECTED NOT DETECTED Final   Streptococcus species NOT DETECTED NOT DETECTED Final   Streptococcus agalactiae NOT DETECTED NOT DETECTED Final   Streptococcus pneumoniae NOT DETECTED NOT DETECTED Final   Streptococcus pyogenes NOT  DETECTED NOT DETECTED Final   Acinetobacter baumannii NOT DETECTED NOT DETECTED Final   Enterobacteriaceae species DETECTED (A) NOT DETECTED Final    Comment: Enterobacteriaceae represent a large family of gram-negative bacteria, not a single organism. CRITICAL RESULT CALLED TO, READ BACK BY AND VERIFIED WITH: V BRYK PHARMD 0304 11/12/16 A BROWNING    Enterobacter cloacae complex NOT DETECTED NOT DETECTED Final   Escherichia coli DETECTED (A) NOT DETECTED Final    Comment: CRITICAL RESULT CALLED TO, READ BACK BY AND VERIFIED WITH: V BRYK PHARMD 0304 11/12/16 A BROWNING    Klebsiella oxytoca NOT DETECTED NOT DETECTED Final   Klebsiella pneumoniae NOT DETECTED NOT DETECTED Final   Proteus species NOT DETECTED NOT DETECTED Final   Serratia marcescens NOT DETECTED NOT DETECTED Final   Carbapenem resistance NOT DETECTED NOT DETECTED Final   Haemophilus influenzae NOT DETECTED NOT DETECTED Final   Neisseria meningitidis NOT DETECTED NOT DETECTED Final   Pseudomonas aeruginosa NOT DETECTED NOT DETECTED Final   Candida albicans NOT DETECTED NOT DETECTED Final   Candida glabrata NOT DETECTED NOT DETECTED Final   Candida krusei NOT DETECTED NOT DETECTED Final   Candida parapsilosis NOT DETECTED NOT DETECTED Final   Candida tropicalis NOT DETECTED NOT DETECTED Final  Culture, blood (Routine X 2) w Reflex to ID Panel     Status: Abnormal   Collection Time: 11/11/16  2:58 PM  Result Value Ref Range Status   Specimen Description BLOOD RIGHT ANTECUBITAL  Final   Special Requests   Final    BOTTLES  DRAWN AEROBIC AND ANAEROBIC Blood Culture adequate volume   Culture  Setup Time   Final    GRAM NEGATIVE RODS IN BOTH AEROBIC AND ANAEROBIC BOTTLES CRITICAL VALUE NOTED.  VALUE IS CONSISTENT WITH PREVIOUSLY REPORTED AND CALLED VALUE.    Culture (A)  Final    ESCHERICHIA COLI SUSCEPTIBILITIES PERFORMED ON PREVIOUS CULTURE WITHIN THE LAST 5 DAYS.    Report Status 11/14/2016 FINAL  Final  Urine Culture      Status: Abnormal   Collection Time: 11/11/16  3:10 PM  Result Value Ref Range Status   Specimen Description URINE, RANDOM  Final   Special Requests NONE  Final   Culture >=100,000 COLONIES/mL ESCHERICHIA COLI (A)  Final   Report Status 11/14/2016 FINAL  Final   Organism ID, Bacteria ESCHERICHIA COLI (A)  Final      Susceptibility   Escherichia coli - MIC*    AMPICILLIN 8 SENSITIVE Sensitive     CEFAZOLIN <=4 SENSITIVE Sensitive     CEFTRIAXONE <=1 SENSITIVE Sensitive     CIPROFLOXACIN <=0.25 SENSITIVE Sensitive     GENTAMICIN <=1 SENSITIVE Sensitive     IMIPENEM <=0.25 SENSITIVE Sensitive     NITROFURANTOIN <=16 SENSITIVE Sensitive     TRIMETH/SULFA <=20 SENSITIVE Sensitive     AMPICILLIN/SULBACTAM <=2 SENSITIVE Sensitive     PIP/TAZO <=4 SENSITIVE Sensitive     Extended ESBL NEGATIVE Sensitive     * >=100,000 COLONIES/mL ESCHERICHIA COLI  Respiratory Panel by PCR     Status: None   Collection Time: 11/12/16  3:37 AM  Result Value Ref Range Status   Adenovirus NOT DETECTED NOT DETECTED Final   Coronavirus 229E NOT DETECTED NOT DETECTED Final   Coronavirus HKU1 NOT DETECTED NOT DETECTED Final   Coronavirus NL63 NOT DETECTED NOT DETECTED Final   Coronavirus OC43 NOT DETECTED NOT DETECTED Final   Metapneumovirus NOT DETECTED NOT DETECTED Final   Rhinovirus / Enterovirus NOT DETECTED NOT DETECTED Final   Influenza A NOT DETECTED NOT DETECTED Final   Influenza B NOT DETECTED NOT DETECTED Final   Parainfluenza Virus 1 NOT DETECTED NOT DETECTED Final   Parainfluenza Virus 2 NOT DETECTED NOT DETECTED Final   Parainfluenza Virus 3 NOT DETECTED NOT DETECTED Final   Parainfluenza Virus 4 NOT DETECTED NOT DETECTED Final   Respiratory Syncytial Virus NOT DETECTED NOT DETECTED Final   Bordetella pertussis NOT DETECTED NOT DETECTED Final   Chlamydophila pneumoniae NOT DETECTED NOT DETECTED Final   Mycoplasma pneumoniae NOT DETECTED NOT DETECTED Final  MRSA PCR Screening     Status:  None   Collection Time: 11/12/16  3:37 AM  Result Value Ref Range Status   MRSA by PCR NEGATIVE NEGATIVE Final    Comment:        The GeneXpert MRSA Assay (FDA approved for NASAL specimens only), is one component of a comprehensive MRSA colonization surveillance program. It is not intended to diagnose MRSA infection nor to guide or monitor treatment for MRSA infections.          Radiology Studies: No results found.      Scheduled Meds: . chlorhexidine  15 mL Mouth Rinse BID  . enoxaparin (LOVENOX) injection  40 mg Subcutaneous Q24H  . levalbuterol  1.25 mg Nebulization BID  . mouth rinse  15 mL Mouth Rinse q12n4p  . metoprolol tartrate  25 mg Oral BID  . midodrine  5 mg Oral 3 times per day  . sodium chloride flush  3 mL Intravenous Q12H  .  vitamin B-12  1,000 mcg Oral Daily   Continuous Infusions: .  ceFAZolin (ANCEF) IV Stopped (11/18/16 1330)     LOS: 7 days    Time spent: 35 minutes.     Hosie Poisson, MD [facility] Pager [phone]  If 7PM-7AM, please contact night-coverage [email or web address] Password [email or web address] 11/18/2016, 2:59 PM

## 2016-11-18 NOTE — Consult Note (Signed)
   [facility] CM Inpatient Consult   11/18/2016  NOLAN TUAZON 06/28/1925 [id number]  Follow up:  Patient remains confused and chart review reveals the patient's family current disposition is for skilled nursing at discharge.  No current community [facility] Care Management needs noted. Will sign off. Please contact, if needs changes:  Natividad Brood, RN BSN [facility] Liaison  [phone] business mobile phone Toll free office [phone]

## 2016-11-18 NOTE — Progress Notes (Signed)
CSW following to facilitate discharge to SNF. Patient has bed offer at [facility], which is the facility of preference. CSW has initiated authorization with [facility]. CSW will follow up tomorrow with authorization updates for patient to transition to SNF.  Laveda Abbe, [place] Clinical Social Worker [phone]

## 2016-11-18 NOTE — Progress Notes (Signed)
SLP Cancellation Note  Patient Details Name: Austin Rose MRN: [id number] DOB: June 03, 1925   Cancelled treatment:       Reason Eval/Treat Not Completed: Patient at procedure or test/unavailable   Elvina Sidle, M.S., CCC-SLP 11/18/2016, 4:06 PM

## 2016-11-18 NOTE — Progress Notes (Signed)
No new arrhythmia in last 24 hours. Switch metoprolol to PO.  Sanda Klein, MD, [facility] [phone] office [phone] pager

## 2016-11-18 NOTE — Progress Notes (Signed)
Physical Therapy Treatment Patient Details Name: Austin Rose MRN: [id number] DOB: 12-07-1925 Today's Date: 11/18/2016    History of Present Illness Patient is a 83 y/o male who was admitted with urosepsis, fever, hypotension, tachycardia and acute renal failure. + UTI. PMH includes HTN, HLD, CAD with CABG, orthostatic hypotension, back surgery, TIA.    PT Comments    Patient tolerated OOB transfer however did not ambulate this session due to fatigue. Son present throughout session. Pt oriented to person only. Continue to progress as tolerated.     Follow Up Recommendations  SNF;Supervision for mobility/OOB;Supervision/Assistance - 24 hour     Equipment Recommendations  None recommended by PT    Recommendations for Other Services       Precautions / Restrictions Precautions Precautions: Fall Precaution Comments: hx of vertigo (takes medicine 3x/day at home) RN notified of this. Restrictions Weight Bearing Restrictions: No    Mobility  Bed Mobility Overal bed mobility: Needs Assistance Bed Mobility: Supine to Sit     Supine to sit: Max assist;HOB elevated     General bed mobility comments: assist for all aspects of bed mobility; cues for sequencing and technique; use of bed pad to mobilize hips and elevate trunk   Transfers Overall transfer level: Needs assistance Equipment used: Rolling walker (2 wheeled) Transfers: Sit to/from [facility] Sit to Stand: Min assist;+2 physical assistance;From elevated surface Stand pivot transfers: Min assist;+2 safety/equipment       General transfer comment: assist to power up into standing and to gain balance upon stand; cues for safe hand placement and use of RW; multimodal cues for pivoting feet and assist to manage RW when pivoting; pt with trunk flexed throughout and assist for eccentric loading and guidance of hips into recliner  Ambulation/Gait                 Stairs            Wheelchair  Mobility    Modified Rankin (Stroke Patients Only)       Balance Overall balance assessment: Needs assistance Sitting-balance support: Feet supported;Single extremity supported Sitting balance-Leahy Scale: Fair Sitting balance - Comments: Requires UE support sitting EOB.   Standing balance support: During functional activity;Bilateral upper extremity supported Standing balance-Leahy Scale: Poor                              Cognition Arousal/Alertness: Lethargic Behavior During Therapy: WFL for tasks assessed/performed Overall Cognitive Status: Impaired/Different from baseline Area of Impairment: Orientation;Attention;Following commands;Problem solving                 Orientation Level: Disoriented to;Time;Situation;Place Current Attention Level: Focused   Following Commands: Follows one step commands with increased time;Follows one step commands inconsistently (repetition and constant cues)     Problem Solving: Slow processing;Decreased initiation;Difficulty sequencing;Requires verbal cues;Requires tactile cues General Comments: pt confused; son present and reported pt is "very out of character"      Exercises      General Comments        Pertinent Vitals/Pain Pain Assessment: Faces Faces Pain Scale: Hurts little more Pain Location: bilat knees Pain Descriptors / Indicators: Aching Pain Intervention(s): Limited activity within patient's tolerance;Monitored during session;Repositioned    Home Living                      Prior Function  PT Goals (current goals can now be found in the care plan section) Progress towards PT goals: Progressing toward goals    Frequency    Min 3X/week      PT Plan Current plan remains appropriate    Co-evaluation              AM-PAC PT "6 Clicks" Daily Activity  Outcome Measure  Difficulty turning over in bed (including adjusting bedclothes, sheets and blankets)?:  Total Difficulty moving from lying on back to sitting on the side of the bed? : Total Difficulty sitting down on and standing up from a chair with arms (e.g., wheelchair, bedside commode, etc,.)?: Total Help needed moving to and from a bed to chair (including a wheelchair)?: A Little Help needed walking in hospital room?: A Lot Help needed climbing 3-5 steps with a railing? : Total 6 Click Score: 9    End of Session Equipment Utilized During Treatment: Gait belt Activity Tolerance: Patient tolerated treatment well Patient left: in chair;with call bell/phone within reach;with family/visitor present;with chair alarm set Nurse Communication: Mobility status PT Visit Diagnosis: Dizziness and giddiness (R42);Unsteadiness on feet (R26.81);Muscle weakness (generalized) (M62.81);Difficulty in walking, not elsewhere classified (R26.2)     Time: 1540-1606 PT Time Calculation (min) (ACUTE ONLY): 26 min  Charges:  $Therapeutic Activity: 23-37 mins                    G Codes:       Earney Navy, PTA Pager: [phone]     Darliss Cheney 11/18/2016, 4:56 PM

## 2016-11-19 ENCOUNTER — Inpatient Hospital Stay (HOSPITAL_COMMUNITY): Payer: Medicare HMO

## 2016-11-19 DIAGNOSIS — I951 Orthostatic hypotension: Secondary | ICD-10-CM

## 2016-11-19 MED ORDER — CEPHALEXIN 500 MG PO CAPS: 500.0000 mg | ORAL_CAPSULE | Freq: Two times a day (BID) | ORAL | 0 refills | Status: AC

## 2016-11-19 MED ORDER — GUAIFENESIN-DM 100-10 MG/5ML PO SYRP: 5.0000 mL | ORAL_SOLUTION | ORAL | Status: DC | PRN

## 2016-11-19 MED ORDER — METOPROLOL TARTRATE 25 MG PO TABS: 25.0000 mg | ORAL_TABLET | Freq: Two times a day (BID) | ORAL | 0 refills | Status: AC

## 2016-11-19 MED ORDER — FUROSEMIDE 20 MG PO TABS
20.0000 mg | ORAL_TABLET | Freq: Once | ORAL | Status: AC
  Administered 2016-11-19: 20 mg via ORAL
  Filled 2016-11-19: qty 1

## 2016-11-19 MED ORDER — LORAZEPAM 2 MG/ML IJ SOLN
1.0000 mg | Freq: Once | INTRAMUSCULAR | Status: AC | PRN
  Administered 2016-11-19: 1 mg via INTRAVENOUS
  Filled 2016-11-19: qty 1

## 2016-11-19 NOTE — Care Management Note (Signed)
Case Management Note  Patient Details  Name: Austin Rose MRN: [id number] Date of Birth: 04-05-1926  Subjective/Objective:                Patient will DC to SNF as facilitated by CSW    Action/Plan:   Expected Discharge Date:  11/19/16               Expected Discharge Plan:  Skilled Nursing Facility  In-House Referral:  Clinical Social Work  Discharge planning Services  CM Consult  Post Acute Care Choice:    Choice offered to:     DME Arranged:    DME Agency:     HH Arranged:    [place] Agency:     Status of Service:  Completed, signed off  If discussed at [employer], dates discussed:    Additional Comments:  Carles Collet, RN 11/19/2016, 2:45 PM

## 2016-11-19 NOTE — Care Management Important Message (Signed)
Important Message  Patient Details  Name: Austin Rose MRN: [id number] Date of Birth: 1925/08/31   Medicare Important Message Given:  Yes    Orbie Pyo 11/19/2016, 11:25 AM

## 2016-11-19 NOTE — Clinical Social Work Placement (Addendum)
   CLINICAL SOCIAL WORK PLACEMENT  NOTE  Date:  11/19/2016  Patient Details  Name: MACARI ZALESKY MRN: [id number] Date of Birth: [birth year]  Clinical Social Work is seeking post-discharge placement for this patient at the [place] level of care (*CSW will initial, date and re-position this form in  chart as items are completed):  Yes   Patient/family provided with [place] Work Department's list of facilities offering this level of care within the geographic area requested by the patient (or if unable, by the patient's family).  Yes   Patient/family informed of their freedom to choose among providers that offer the needed level of care, that participate in Medicare, Medicaid or managed care program needed by the patient, have an available bed and are willing to accept the patient.  Yes   Patient/family informed of [facility] ownership interest in [facility] and [facility], as well as of the fact that they are under no obligation to receive care at these facilities.  PASRR submitted to EDS on       PASRR number received on       Existing PASRR number confirmed on 11/15/16     FL2 transmitted to all facilities in geographic area requested by pt/family on 11/15/16     FL2 transmitted to all facilities within larger geographic area on       Patient informed that his/her managed care company has contracts with or will negotiate with certain facilities, including the following:        Yes   Patient/family informed of bed offers received.  Patient chooses bed at [facility]     Physician recommends and patient chooses bed at      Patient to be transferred to [facility] on  .  Patient to be transferred to facility by     EMS   Patient family notified on   of transfer.  wife  Name of family member notified:      Wife (via phone)  PHYSICIAN       Additional Comment:     _______________________________________________ Eileen Stanford, LCSW 11/19/2016, 11:46 AM

## 2016-11-19 NOTE — Progress Notes (Signed)
Telemetry reviewed. Another 24h without significant arrhythmia. BP in normal range. Continue current beta blocker dose. Please call with any additional concerns.  Sanda Klein, MD, [facility] [phone] office [phone] pager

## 2016-11-19 NOTE — Progress Notes (Signed)
  Speech Language Pathology Treatment: Dysphagia  Patient Details Name: Austin Rose MRN: [id number] DOB: 1926-01-23 Today's Date: 11/19/2016 Time: 1210-1220 SLP Time Calculation (min) (ACUTE ONLY): 10 min  Assessment / Plan / Recommendation Clinical Impression  Pt much more alert today, conversant although confused.  He was participatory, consumed soft solids, thin liquids from a straw with adequate mastication, brisk swallow response, no s/s of aspiration.  Appears to be tolerating diet well.  No further SLP required - continue soft diet, thin liquids, meds with puree and hold POs when lethargic.  Our services will sign off.   HPI HPI: Patient is a 83 y/o male who was admitted with urosepsis, fever, hypotension, tachycardia and acute renal failure. + UTI. PMH includes HTN, HLD, CAD with CABG, orthostatic hypotension, back surgery, TIA.      SLP Plan  All goals met       Recommendations  Diet recommendations: Thin liquid (soft) Liquids provided via: Cup;Straw Medication Administration: Whole meds with puree Supervision: Patient able to self feed;Staff to assist with self feeding                Oral Care Recommendations: Oral care BID SLP Visit Diagnosis: Dysphagia, unspecified (R13.10) Plan: All goals met       GO                Austin Rose 11/19/2016, 1:07 PM

## 2016-11-19 NOTE — Clinical Social Work Note (Signed)
Per MD pt will be evaluated tomorrow 8/8 and if stable pt will d/c 8/8. Janie at [facility] made aware.   [place], [place]

## 2016-11-19 NOTE — Progress Notes (Signed)
PROGRESS NOTE    Austin Rose  WUJ:[id number] DOB: 01/22/1926 DOA: 11/11/2016 PCP: Burnis Medin, MD    Brief Narrative: 83yo M w/ a hx of Orthostatic Hypotensionon Midodrine, Aortic Stenosis, Pulmonary HTN, remote CAD/CABG, arthritis, HLD and Cancer who was sent to the ER after developing progressive confusion (did not recognize family members)in the setting of persistent fevers. He was not speaking but was moving all extremitis. He was incontinent of a large amount of dark colored strong smelling urine. He was admitted for sepsis from UTI. He was scheduled to be discharged on 8/7, pt wife at bedside reports worsening cough, and that he has not been eating well.    Assessment & Plan:   Principal Problem:   Sepsis ([place]) Active Problems:   Acute kidney injury (HCC)   CAD (coronary artery disease)   Arthritis   Elevated troponin   Thrombocytopenia, idiopathic (HCC)   Orthostatic hypotension   Acute metabolic encephalopathy   Aortic stenosis, moderate w/ mild pulmonary HTN   Hyperlipemia   Pressure injury of skin   Atrial tachycardia (HCC)   Sepsis from E coli UTi and bacteremia:  Was on IV rocephin for the e coli bacteremia, transition to cefazolin. Plan to transition him to keflex to complete the course on discharge.  Lactic acid normalized. Afebrile, leukocytosis persistent, repeat CBC in am.     Acute metabolic encephalopathy:  Improving.  Family adamant about stopping the seroquel, though its at a very low dose. Stopped seroquel on 8/5 and he is more alert and awake and talking. Wants to eat oyester soup and wants to be discharged.    Hypernatremia:  IV dextrose fluids on board, secondary to free water deficit. Repeat sodium at 145. D/c dextrose fluids.   Mild thrombocytopenia:  Possibly from sepsis. Improved.  No signs of bleeding. Repeat CBC shows normal platelets.   Acute kidney injury:  Improving with hydration. Possibly from ATN due to severe sepsis,  creatinine almost back to baseline.    Chronic systolic heart failure:  Compensated,  Euvolemic.    CAD s/p CABG:  Mildly elevated troponin possibly from demand ischemia from sepsis. Cardiology on board and recommendations given. Metoprolol added for SVT.    Orthostatic hypotension:  Resume midodrine.   Decreased appetite:  Nutrition consulted for ensure supplementation.      DVT prophylaxis: lovenox.  Code Status: (Full code) Family Communication: discussed with family at bedside.  Disposition Plan: snf tomorrow.    Consultants:   Cardiology.    Procedures: none.   Antimicrobials:rocephine since admission.  Cefazolin from 8/5  Subjective: No new complaints. Is comfortable.  Wants to know when he can be discharged.   Objective: Vitals:   11/18/16 1156 11/18/16 1941 11/18/16 2113 11/19/16 0556  BP: 120/85 125/78  132/72  Pulse: 70 77  76  Resp: 18 18  18   Temp: 98 F (36.7 C) 98 F (36.7 C)  98 F (36.7 C)  TempSrc: Oral Oral  Axillary  SpO2: 98% 98% 97% 98%  Weight:    76.2 kg (167 lb 15.9 oz)  Height:        Intake/Output Summary (Last 24 hours) at 11/19/16 1605 Last data filed at 11/19/16 0935  Gross per 24 hour  Intake              715 ml  Output              300 ml  Net  415 ml   Filed Weights   11/17/16 0444 11/18/16 0300 11/19/16 0556  Weight: 75.9 kg (167 lb 5.3 oz) 73.5 kg (162 lb 0.6 oz) 76.2 kg (167 lb 15.9 oz)    Examination: stable.  General exam: Appears calm and comfortable not in any distress.  Respiratory system: Clear to auscultation no wheezing or rhonchi.  Cardiovascular system: S1 & S2 heard, RRR. No JVD, murmurs, rubs, gallops or clicks. No pedal edema. Gastrointestinal system: Abdomen is soft NT ND bs+ Central nervous system: alert, non focal, speech normal, able to move all extremities.  Extremities: Symmetric 5 x 5 power. Skin: No rashes, lesions or ulcers Psychiatry:  Mood & affect appropriate.      Data Reviewed: I have personally reviewed following labs and imaging studies  CBC:  Recent Labs Lab 11/14/16 0631 11/15/16 0306 11/16/16 0309 11/17/16 0536 11/18/16 0419  WBC 12.0* 12.6* 11.0* 13.3* 16.7*  HGB 10.3* 10.3* 10.8* 10.9* 12.0*  HCT 30.5* 31.1* 33.7* 34.0* 35.7*  MCV 86.4 87.9 89.2 89.5 90.4  PLT 155 173 146* 144* 601   Basic Metabolic Panel:  Recent Labs Lab 11/14/16 0631 11/14/16 1125 11/14/16 2038 11/15/16 0306 11/16/16 0309 11/17/16 0434 11/18/16 0419  NA 140  --   --  143 146* 144 145  K 3.2* 3.1* 3.6 4.2 4.1 4.5 3.6  CL 119*  --   --  119* 123* 119* 119*  CO2 13*  --   --  16* 13* 18* 16*  GLUCOSE 138*  --   --  104* 98 98 82  BUN 57*  --   --  59* 55* 44* 33*  CREATININE 1.73*  --   --  1.72* 1.60* 1.42* 1.24  CALCIUM 8.3*  --   --  8.4* 8.3* 8.3* 8.2*  MG  --  2.1 2.1 2.1  --   --   --    GFR: Estimated Creatinine Clearance: 37.7 mL/min (by C-G formula based on SCr of 1.24 mg/dL). Liver Function Tests:  Recent Labs Lab 11/14/16 0631  AST 58*  ALT 33  ALKPHOS 59  BILITOT 0.4  PROT 5.1*  ALBUMIN 2.2*   No results for input(s): LIPASE, AMYLASE in the last 168 hours. No results for input(s): AMMONIA in the last 168 hours. Coagulation Profile: No results for input(s): INR, PROTIME in the last 168 hours. Cardiac Enzymes:  Recent Labs Lab 11/12/16 1911 11/15/16 0306  TROPONINI 6.32* 3.13*   BNP (last 3 results) No results for input(s): PROBNP in the last 8760 hours. HbA1C: No results for input(s): HGBA1C in the last 72 hours. CBG: No results for input(s): GLUCAP in the last 168 hours. Lipid Profile: No results for input(s): CHOL, HDL, LDLCALC, TRIG, CHOLHDL, LDLDIRECT in the last 72 hours. Thyroid Function Tests: No results for input(s): TSH, T4TOTAL, FREET4, T3FREE, THYROIDAB in the last 72 hours. Anemia Panel: No results for input(s): VITAMINB12, FOLATE, FERRITIN, TIBC, IRON, RETICCTPCT in the last 72 hours. Sepsis  Labs:  Recent Labs Lab 11/13/16 0503  PROCALCITON 1.59    Recent Results (from the past 240 hour(s))  Culture, blood (Routine X 2) w Reflex to ID Panel     Status: Abnormal   Collection Time: 11/11/16  2:15 PM  Result Value Ref Range Status   Specimen Description BLOOD LEFT ANTECUBITAL  Final   Special Requests   Final    BOTTLES DRAWN AEROBIC AND ANAEROBIC Blood Culture adequate volume   Culture  Setup Time   Final  GRAM NEGATIVE RODS IN BOTH AEROBIC AND ANAEROBIC BOTTLES CRITICAL RESULT CALLED TO, READ BACK BY AND VERIFIED WITH: Alona Bene PHARMD 0304 11/12/16 A BROWNING    Culture ESCHERICHIA COLI (A)  Final   Report Status 11/14/2016 FINAL  Final   Organism ID, Bacteria ESCHERICHIA COLI  Final      Susceptibility   Escherichia coli - MIC*    AMPICILLIN 8 SENSITIVE Sensitive     CEFAZOLIN <=4 SENSITIVE Sensitive     CEFEPIME <=1 SENSITIVE Sensitive     CEFTAZIDIME <=1 SENSITIVE Sensitive     CEFTRIAXONE <=1 SENSITIVE Sensitive     CIPROFLOXACIN <=0.25 SENSITIVE Sensitive     GENTAMICIN <=1 SENSITIVE Sensitive     IMIPENEM <=0.25 SENSITIVE Sensitive     TRIMETH/SULFA <=20 SENSITIVE Sensitive     AMPICILLIN/SULBACTAM <=2 SENSITIVE Sensitive     PIP/TAZO <=4 SENSITIVE Sensitive     Extended ESBL NEGATIVE Sensitive     * ESCHERICHIA COLI  Blood Culture ID Panel (Reflexed)     Status: Abnormal   Collection Time: 11/11/16  2:15 PM  Result Value Ref Range Status   Enterococcus species NOT DETECTED NOT DETECTED Final   Listeria monocytogenes NOT DETECTED NOT DETECTED Final   Staphylococcus species NOT DETECTED NOT DETECTED Final   Staphylococcus aureus NOT DETECTED NOT DETECTED Final   Streptococcus species NOT DETECTED NOT DETECTED Final   Streptococcus agalactiae NOT DETECTED NOT DETECTED Final   Streptococcus pneumoniae NOT DETECTED NOT DETECTED Final   Streptococcus pyogenes NOT DETECTED NOT DETECTED Final   Acinetobacter baumannii NOT DETECTED NOT DETECTED Final    Enterobacteriaceae species DETECTED (A) NOT DETECTED Final    Comment: Enterobacteriaceae represent a large family of gram-negative bacteria, not a single organism. CRITICAL RESULT CALLED TO, READ BACK BY AND VERIFIED WITH: V BRYK PHARMD 0304 11/12/16 A BROWNING    Enterobacter cloacae complex NOT DETECTED NOT DETECTED Final   Escherichia coli DETECTED (A) NOT DETECTED Final    Comment: CRITICAL RESULT CALLED TO, READ BACK BY AND VERIFIED WITH: V BRYK PHARMD 0304 11/12/16 A BROWNING    Klebsiella oxytoca NOT DETECTED NOT DETECTED Final   Klebsiella pneumoniae NOT DETECTED NOT DETECTED Final   Proteus species NOT DETECTED NOT DETECTED Final   Serratia marcescens NOT DETECTED NOT DETECTED Final   Carbapenem resistance NOT DETECTED NOT DETECTED Final   Haemophilus influenzae NOT DETECTED NOT DETECTED Final   Neisseria meningitidis NOT DETECTED NOT DETECTED Final   Pseudomonas aeruginosa NOT DETECTED NOT DETECTED Final   Candida albicans NOT DETECTED NOT DETECTED Final   Candida glabrata NOT DETECTED NOT DETECTED Final   Candida krusei NOT DETECTED NOT DETECTED Final   Candida parapsilosis NOT DETECTED NOT DETECTED Final   Candida tropicalis NOT DETECTED NOT DETECTED Final  Culture, blood (Routine X 2) w Reflex to ID Panel     Status: Abnormal   Collection Time: 11/11/16  2:58 PM  Result Value Ref Range Status   Specimen Description BLOOD RIGHT ANTECUBITAL  Final   Special Requests   Final    BOTTLES DRAWN AEROBIC AND ANAEROBIC Blood Culture adequate volume   Culture  Setup Time   Final    GRAM NEGATIVE RODS IN BOTH AEROBIC AND ANAEROBIC BOTTLES CRITICAL VALUE NOTED.  VALUE IS CONSISTENT WITH PREVIOUSLY REPORTED AND CALLED VALUE.    Culture (A)  Final    ESCHERICHIA COLI SUSCEPTIBILITIES PERFORMED ON PREVIOUS CULTURE WITHIN THE LAST 5 DAYS.    Report Status 11/14/2016 FINAL  Final  Urine Culture     Status: Abnormal   Collection Time: 11/11/16  3:10 PM  Result Value Ref Range  Status   Specimen Description URINE, RANDOM  Final   Special Requests NONE  Final   Culture >=100,000 COLONIES/mL ESCHERICHIA COLI (A)  Final   Report Status 11/14/2016 FINAL  Final   Organism ID, Bacteria ESCHERICHIA COLI (A)  Final      Susceptibility   Escherichia coli - MIC*    AMPICILLIN 8 SENSITIVE Sensitive     CEFAZOLIN <=4 SENSITIVE Sensitive     CEFTRIAXONE <=1 SENSITIVE Sensitive     CIPROFLOXACIN <=0.25 SENSITIVE Sensitive     GENTAMICIN <=1 SENSITIVE Sensitive     IMIPENEM <=0.25 SENSITIVE Sensitive     NITROFURANTOIN <=16 SENSITIVE Sensitive     TRIMETH/SULFA <=20 SENSITIVE Sensitive     AMPICILLIN/SULBACTAM <=2 SENSITIVE Sensitive     PIP/TAZO <=4 SENSITIVE Sensitive     Extended ESBL NEGATIVE Sensitive     * >=100,000 COLONIES/mL ESCHERICHIA COLI  Respiratory Panel by PCR     Status: None   Collection Time: 11/12/16  3:37 AM  Result Value Ref Range Status   Adenovirus NOT DETECTED NOT DETECTED Final   Coronavirus 229E NOT DETECTED NOT DETECTED Final   Coronavirus HKU1 NOT DETECTED NOT DETECTED Final   Coronavirus NL63 NOT DETECTED NOT DETECTED Final   Coronavirus OC43 NOT DETECTED NOT DETECTED Final   Metapneumovirus NOT DETECTED NOT DETECTED Final   Rhinovirus / Enterovirus NOT DETECTED NOT DETECTED Final   Influenza A NOT DETECTED NOT DETECTED Final   Influenza B NOT DETECTED NOT DETECTED Final   Parainfluenza Virus 1 NOT DETECTED NOT DETECTED Final   Parainfluenza Virus 2 NOT DETECTED NOT DETECTED Final   Parainfluenza Virus 3 NOT DETECTED NOT DETECTED Final   Parainfluenza Virus 4 NOT DETECTED NOT DETECTED Final   Respiratory Syncytial Virus NOT DETECTED NOT DETECTED Final   Bordetella pertussis NOT DETECTED NOT DETECTED Final   Chlamydophila pneumoniae NOT DETECTED NOT DETECTED Final   Mycoplasma pneumoniae NOT DETECTED NOT DETECTED Final  MRSA PCR Screening     Status: None   Collection Time: 11/12/16  3:37 AM  Result Value Ref Range Status   MRSA by  PCR NEGATIVE NEGATIVE Final    Comment:        The GeneXpert MRSA Assay (FDA approved for NASAL specimens only), is one component of a comprehensive MRSA colonization surveillance program. It is not intended to diagnose MRSA infection nor to guide or monitor treatment for MRSA infections.          Radiology Studies: No results found.      Scheduled Meds: . chlorhexidine  15 mL Mouth Rinse BID  . enoxaparin (LOVENOX) injection  40 mg Subcutaneous Q24H  . levalbuterol  1.25 mg Nebulization BID  . mouth rinse  15 mL Mouth Rinse q12n4p  . metoprolol tartrate  25 mg Oral BID  . midodrine  5 mg Oral 3 times per day  . sodium chloride flush  3 mL Intravenous Q12H  . vitamin B-12  1,000 mcg Oral Daily   Continuous Infusions: .  ceFAZolin (ANCEF) IV Stopped (11/19/16 1255)     LOS: 8 days    Time spent: 35 minutes.     Hosie Poisson, MD [facility] Pager [phone]  If 7PM-7AM, please contact night-coverage [email or web address] Password [email or web address] 11/19/2016, 4:05 PM

## 2016-11-19 NOTE — Discharge Summary (Signed)
Physician Discharge Summary  Austin Rose ZWC:[id number] DOB: 1926/03/03 DOA: 11/11/2016  PCP: Burnis Medin, MD  Admit date: 11/11/2016 Discharge date: 11/19/2016  Admitted From: HOME.  Disposition: SNF  Recommendations for Outpatient Follow-up:  1. Follow up with PCP in 1-2 weeks 2. Please obtain BMP/CBC in one week   Discharge Condition:stable.  CODE STATUS: full code.  Diet recommendation: Heart Healthy   Brief/Interim Summary: 83yo M w/ a hx of Orthostatic Hypotensionon Midodrine, Aortic Stenosis, Pulmonary HTN, remote CAD/CABG, arthritis, HLD and Cancer who was sent to the ER after developing progressive confusion (did not recognize family members)in the setting of persistent fevers. He was not speaking but was moving all extremitis. He was incontinent of a large amount of dark colored strong smelling urine. He was admitted for sepsis from UTI.   Discharge Diagnoses:  Principal Problem:   Sepsis ([place]) Active Problems:   Acute kidney injury (HCC)   CAD (coronary artery disease)   Arthritis   Elevated troponin   Thrombocytopenia, idiopathic (HCC)   Orthostatic hypotension   Acute metabolic encephalopathy   Aortic stenosis, moderate w/ mild pulmonary HTN   Hyperlipemia   Pressure injury of skin   Atrial tachycardia (HCC)  Sepsis from E coli UTi and bacteremia:  Was on IV rocephin for the e coli bacteremia, transition to cefazolin. Plan to transition him to keflex to complete the course on discharge.  Lactic acid normalized. No new complaints.     Acute metabolic encephalopathy:  Improving.  Family adamant about stopping the seroquel, though its at a very low dose. Stopped seroquel on 8/5 and he is more alert and fidgety.    Hypernatremia:  IV dextrose fluids on board, secondary to free water deficit. Repeat sodium at 145. D/c dextrose fluids.   Mild thrombocytopenia:  Possibly from sepsis. Improved.  Monitor ,no signs of bleeding.   Acute kidney  injury:  Improving with hydration. Possibly from ATN due to severe sepsis, creatinine almost back to baseline.    Chronic systolic heart failure:  Compensated,  Euvolemic.    CAD s/p CABG:  Mildly elevated troponin possibly from demand ischemia from sepsis. Cardiology on board and recommendations given. Metoprolol added for SVT.    Orthostatic hypotension:  Resume midodrine.   Discharge Instructions  Discharge Instructions    Diet - low sodium heart healthy    Complete by:  As directed    Discharge instructions    Complete by:  As directed    Follow up with PCP in one week.     Allergies as of 11/19/2016      Reactions   Fentanyl    Confusion   Gabapentin Other (See Comments)   Behavior changes noted by family  Better when stopped  2015   Norco [hydrocodone-acetaminophen]    Confusion   Tramadol    confusion   Penicillins Rash   Rash - years ago per wife      Medication List    STOP taking these medications   gabapentin 300 MG capsule Commonly known as:  NEURONTIN   traZODone 50 MG tablet Commonly known as:  DESYREL     TAKE these medications   aspirin 325 MG tablet Take 1 tablet (325 mg total) by mouth daily.   B-12 PO Take 1 tablet by mouth 2 (two) times daily.   cephALEXin 500 MG capsule Commonly known as:  KEFLEX Take 1 capsule (500 mg total) by mouth 2 (two) times daily.   metoprolol tartrate 25 MG  tablet Commonly known as:  LOPRESSOR Take 1 tablet (25 mg total) by mouth 2 (two) times daily.   midodrine 5 MG tablet Commonly known as:  PROAMATINE Take 5 mg by mouth 3 (three) times daily with meals. 1 tab  8AM, 1 PM and 1 at 6 PM- Pt gets thru the [place]   simvastatin 20 MG tablet Commonly known as:  ZOCOR Take 0.5 tablets by mouth daily.       Contact information for follow-up providers    Panosh, Standley Brooking, MD. Schedule an appointment as soon as possible for a visit in 1 week(s).   Specialties:  Internal Medicine, Pediatrics Contact  information: [place] [id number] [phone]            Contact information for after-discharge care    Destination    [facility] Follow up.   Specialty:  [place] information: [place] [phone]                 Allergies  Allergen Reactions  . Fentanyl     Confusion   . Gabapentin Other (See Comments)    Behavior changes noted by family  Better when stopped  2015  . Norco [Hydrocodone-Acetaminophen]     Confusion   . Tramadol     confusion  . Penicillins Rash    Rash - years ago per wife    Consultations:  Cardiology.    Procedures/Studies: Ct Head Wo Contrast  Result Date: 11/11/2016 CLINICAL DATA:  Altered level of consciousness.  Confusion EXAM: CT HEAD WITHOUT CONTRAST TECHNIQUE: Contiguous axial images were obtained from the base of the skull through the vertex without intravenous contrast. COMPARISON:  CT head 08/13/2016 FINDINGS: Brain: Moderate atrophy. Chronic microvascular ischemic change in the white matter. Negative for acute infarct, hemorrhage, or mass lesion. No shift of the midline structures. Vascular: Negative for hyperdense vessel. Skull: Negative Sinuses/Orbits: Negative Other: None IMPRESSION: Atrophy and chronic microvascular ischemia.  No acute abnormality. Electronically Signed   By: Franchot Gallo M.D.   On: 11/11/2016 15:26   Dg Chest Port 1 View  Result Date: 11/12/2016 CLINICAL DATA:  Atrial fibrillation. EXAM: PORTABLE CHEST 1 VIEW COMPARISON:  11/11/2016. FINDINGS: Prior CABG. Cardiomegaly with normal pulmonary vascularity . No focal infiltrate . Stable elevation left hemidiaphragm. No pleural effusion or pneumothorax P IMPRESSION: 1. Prior CABG.  Stable cardiomegaly. 2.  Stable elevation left hemidiaphragm . Electronically Signed   By: Marcello Moores  Register   On: 11/12/2016 06:44   Dg Chest Portable 1 View  Result  Date: 11/11/2016 CLINICAL DATA:  Altered level of consciousness, patient undergoing sepsis evaluation. History of coronary artery disease, hypertension, former smoker. EXAM: PORTABLE CHEST 1 VIEW COMPARISON:  AP chest x-ray of Aug 13, 2016. FINDINGS: The lungs are adequately inflated and clear. The heart is top-normal in size. The pulmonary vascularity is normal. There is tortuosity of the ascending and descending thoracic aorta with mural calcification. The patient has undergone previous CABG. The observed bony thorax exhibits no acute abnormality. IMPRESSION: There is no acute cardiopulmonary abnormality.  Previous CABG. Thoracic aortic atherosclerosis. Electronically Signed   By: David  Martinique M.D.   On: 11/11/2016 14:50     Subjective: No new complaints.   Discharge Exam: Vitals:   11/18/16 1941 11/19/16 0556  BP: 125/78 132/72  Pulse: 77 76  Resp: 18 18  Temp: 98 F (36.7 C) 98 F (36.7 C)   Vitals:  11/18/16 1156 11/18/16 1941 11/18/16 2113 11/19/16 0556  BP: 120/85 125/78  132/72  Pulse: 70 77  76  Resp: 18 18  18   Temp: 98 F (36.7 C) 98 F (36.7 C)  98 F (36.7 C)  TempSrc: Oral Oral  Axillary  SpO2: 98% 98% 97% 98%  Weight:    76.2 kg (167 lb 15.9 oz)  Height:        General: Pt is alert, awake, not in acute distress Cardiovascular: RRR, S1/S2 +, no rubs, no gallops Respiratory: CTA bilaterally, no wheezing, no rhonchi Abdominal: Soft, NT, ND, bowel sounds + Extremities: no edema, no cyanosis    The results of significant diagnostics from this hospitalization (including imaging, microbiology, ancillary and laboratory) are listed below for reference.     Microbiology: Recent Results (from the past 240 hour(s))  Culture, blood (Routine X 2) w Reflex to ID Panel     Status: Abnormal   Collection Time: 11/11/16  2:15 PM  Result Value Ref Range Status   Specimen Description BLOOD LEFT ANTECUBITAL  Final   Special Requests   Final    BOTTLES DRAWN AEROBIC AND  ANAEROBIC Blood Culture adequate volume   Culture  Setup Time   Final    GRAM NEGATIVE RODS IN BOTH AEROBIC AND ANAEROBIC BOTTLES CRITICAL RESULT CALLED TO, READ BACK BY AND VERIFIED WITH: Alona Bene PHARMD 0304 11/12/16 A BROWNING    Culture ESCHERICHIA COLI (A)  Final   Report Status 11/14/2016 FINAL  Final   Organism ID, Bacteria ESCHERICHIA COLI  Final      Susceptibility   Escherichia coli - MIC*    AMPICILLIN 8 SENSITIVE Sensitive     CEFAZOLIN <=4 SENSITIVE Sensitive     CEFEPIME <=1 SENSITIVE Sensitive     CEFTAZIDIME <=1 SENSITIVE Sensitive     CEFTRIAXONE <=1 SENSITIVE Sensitive     CIPROFLOXACIN <=0.25 SENSITIVE Sensitive     GENTAMICIN <=1 SENSITIVE Sensitive     IMIPENEM <=0.25 SENSITIVE Sensitive     TRIMETH/SULFA <=20 SENSITIVE Sensitive     AMPICILLIN/SULBACTAM <=2 SENSITIVE Sensitive     PIP/TAZO <=4 SENSITIVE Sensitive     Extended ESBL NEGATIVE Sensitive     * ESCHERICHIA COLI  Blood Culture ID Panel (Reflexed)     Status: Abnormal   Collection Time: 11/11/16  2:15 PM  Result Value Ref Range Status   Enterococcus species NOT DETECTED NOT DETECTED Final   Listeria monocytogenes NOT DETECTED NOT DETECTED Final   Staphylococcus species NOT DETECTED NOT DETECTED Final   Staphylococcus aureus NOT DETECTED NOT DETECTED Final   Streptococcus species NOT DETECTED NOT DETECTED Final   Streptococcus agalactiae NOT DETECTED NOT DETECTED Final   Streptococcus pneumoniae NOT DETECTED NOT DETECTED Final   Streptococcus pyogenes NOT DETECTED NOT DETECTED Final   Acinetobacter baumannii NOT DETECTED NOT DETECTED Final   Enterobacteriaceae species DETECTED (A) NOT DETECTED Final    Comment: Enterobacteriaceae represent a large family of gram-negative bacteria, not a single organism. CRITICAL RESULT CALLED TO, READ BACK BY AND VERIFIED WITH: V BRYK PHARMD 0304 11/12/16 A BROWNING    Enterobacter cloacae complex NOT DETECTED NOT DETECTED Final   Escherichia coli DETECTED (A) NOT  DETECTED Final    Comment: CRITICAL RESULT CALLED TO, READ BACK BY AND VERIFIED WITH: V BRYK PHARMD 0304 11/12/16 A BROWNING    Klebsiella oxytoca NOT DETECTED NOT DETECTED Final   Klebsiella pneumoniae NOT DETECTED NOT DETECTED Final   Proteus species NOT DETECTED NOT DETECTED Final  Serratia marcescens NOT DETECTED NOT DETECTED Final   Carbapenem resistance NOT DETECTED NOT DETECTED Final   Haemophilus influenzae NOT DETECTED NOT DETECTED Final   Neisseria meningitidis NOT DETECTED NOT DETECTED Final   Pseudomonas aeruginosa NOT DETECTED NOT DETECTED Final   Candida albicans NOT DETECTED NOT DETECTED Final   Candida glabrata NOT DETECTED NOT DETECTED Final   Candida krusei NOT DETECTED NOT DETECTED Final   Candida parapsilosis NOT DETECTED NOT DETECTED Final   Candida tropicalis NOT DETECTED NOT DETECTED Final  Culture, blood (Routine X 2) w Reflex to ID Panel     Status: Abnormal   Collection Time: 11/11/16  2:58 PM  Result Value Ref Range Status   Specimen Description BLOOD RIGHT ANTECUBITAL  Final   Special Requests   Final    BOTTLES DRAWN AEROBIC AND ANAEROBIC Blood Culture adequate volume   Culture  Setup Time   Final    GRAM NEGATIVE RODS IN BOTH AEROBIC AND ANAEROBIC BOTTLES CRITICAL VALUE NOTED.  VALUE IS CONSISTENT WITH PREVIOUSLY REPORTED AND CALLED VALUE.    Culture (A)  Final    ESCHERICHIA COLI SUSCEPTIBILITIES PERFORMED ON PREVIOUS CULTURE WITHIN THE LAST 5 DAYS.    Report Status 11/14/2016 FINAL  Final  Urine Culture     Status: Abnormal   Collection Time: 11/11/16  3:10 PM  Result Value Ref Range Status   Specimen Description URINE, RANDOM  Final   Special Requests NONE  Final   Culture >=100,000 COLONIES/mL ESCHERICHIA COLI (A)  Final   Report Status 11/14/2016 FINAL  Final   Organism ID, Bacteria ESCHERICHIA COLI (A)  Final      Susceptibility   Escherichia coli - MIC*    AMPICILLIN 8 SENSITIVE Sensitive     CEFAZOLIN <=4 SENSITIVE Sensitive      CEFTRIAXONE <=1 SENSITIVE Sensitive     CIPROFLOXACIN <=0.25 SENSITIVE Sensitive     GENTAMICIN <=1 SENSITIVE Sensitive     IMIPENEM <=0.25 SENSITIVE Sensitive     NITROFURANTOIN <=16 SENSITIVE Sensitive     TRIMETH/SULFA <=20 SENSITIVE Sensitive     AMPICILLIN/SULBACTAM <=2 SENSITIVE Sensitive     PIP/TAZO <=4 SENSITIVE Sensitive     Extended ESBL NEGATIVE Sensitive     * >=100,000 COLONIES/mL ESCHERICHIA COLI  Respiratory Panel by PCR     Status: None   Collection Time: 11/12/16  3:37 AM  Result Value Ref Range Status   Adenovirus NOT DETECTED NOT DETECTED Final   Coronavirus 229E NOT DETECTED NOT DETECTED Final   Coronavirus HKU1 NOT DETECTED NOT DETECTED Final   Coronavirus NL63 NOT DETECTED NOT DETECTED Final   Coronavirus OC43 NOT DETECTED NOT DETECTED Final   Metapneumovirus NOT DETECTED NOT DETECTED Final   Rhinovirus / Enterovirus NOT DETECTED NOT DETECTED Final   Influenza A NOT DETECTED NOT DETECTED Final   Influenza B NOT DETECTED NOT DETECTED Final   Parainfluenza Virus 1 NOT DETECTED NOT DETECTED Final   Parainfluenza Virus 2 NOT DETECTED NOT DETECTED Final   Parainfluenza Virus 3 NOT DETECTED NOT DETECTED Final   Parainfluenza Virus 4 NOT DETECTED NOT DETECTED Final   Respiratory Syncytial Virus NOT DETECTED NOT DETECTED Final   Bordetella pertussis NOT DETECTED NOT DETECTED Final   Chlamydophila pneumoniae NOT DETECTED NOT DETECTED Final   Mycoplasma pneumoniae NOT DETECTED NOT DETECTED Final  MRSA PCR Screening     Status: None   Collection Time: 11/12/16  3:37 AM  Result Value Ref Range Status   MRSA by PCR NEGATIVE NEGATIVE  Final    Comment:        The GeneXpert MRSA Assay (FDA approved for NASAL specimens only), is one component of a comprehensive MRSA colonization surveillance program. It is not intended to diagnose MRSA infection nor to guide or monitor treatment for MRSA infections.      Labs: BNP (last 3 results) No results for input(s): BNP in  the last 8760 hours. Basic Metabolic Panel:  Recent Labs Lab 11/14/16 0631 11/14/16 1125 11/14/16 2038 11/15/16 0306 11/16/16 0309 11/17/16 0434 11/18/16 0419  NA 140  --   --  143 146* 144 145  K 3.2* 3.1* 3.6 4.2 4.1 4.5 3.6  CL 119*  --   --  119* 123* 119* 119*  CO2 13*  --   --  16* 13* 18* 16*  GLUCOSE 138*  --   --  104* 98 98 82  BUN 57*  --   --  59* 55* 44* 33*  CREATININE 1.73*  --   --  1.72* 1.60* 1.42* 1.24  CALCIUM 8.3*  --   --  8.4* 8.3* 8.3* 8.2*  MG  --  2.1 2.1 2.1  --   --   --    Liver Function Tests:  Recent Labs Lab 11/14/16 0631  AST 58*  ALT 33  ALKPHOS 59  BILITOT 0.4  PROT 5.1*  ALBUMIN 2.2*   No results for input(s): LIPASE, AMYLASE in the last 168 hours. No results for input(s): AMMONIA in the last 168 hours. CBC:  Recent Labs Lab 11/14/16 0631 11/15/16 0306 11/16/16 0309 11/17/16 0536 11/18/16 0419  WBC 12.0* 12.6* 11.0* 13.3* 16.7*  HGB 10.3* 10.3* 10.8* 10.9* 12.0*  HCT 30.5* 31.1* 33.7* 34.0* 35.7*  MCV 86.4 87.9 89.2 89.5 90.4  PLT 155 173 146* 144* 155   Cardiac Enzymes:  Recent Labs Lab 11/12/16 1911 11/15/16 0306  TROPONINI 6.32* 3.13*   BNP: Invalid input(s): POCBNP CBG: No results for input(s): GLUCAP in the last 168 hours. D-Dimer No results for input(s): DDIMER in the last 72 hours. Hgb A1c No results for input(s): HGBA1C in the last 72 hours. Lipid Profile No results for input(s): CHOL, HDL, LDLCALC, TRIG, CHOLHDL, LDLDIRECT in the last 72 hours. Thyroid function studies No results for input(s): TSH, T4TOTAL, T3FREE, THYROIDAB in the last 72 hours.  Invalid input(s): FREET3 Anemia work up No results for input(s): VITAMINB12, FOLATE, FERRITIN, TIBC, IRON, RETICCTPCT in the last 72 hours. Urinalysis    Component Value Date/Time   COLORURINE AMBER (A) 11/11/2016 1510   APPEARANCEUR HAZY (A) 11/11/2016 1510   LABSPEC 1.015 11/11/2016 1510   PHURINE 5.0 11/11/2016 1510   GLUCOSEU NEGATIVE  11/11/2016 1510   HGBUR MODERATE (A) 11/11/2016 1510   HGBUR negative 11/17/2009 1342   BILIRUBINUR NEGATIVE 11/11/2016 1510   BILIRUBINUR n 05/15/2015 1445   KETONESUR NEGATIVE 11/11/2016 1510   PROTEINUR 100 (A) 11/11/2016 1510   UROBILINOGEN 1.0 05/15/2015 1445   UROBILINOGEN 0.2 07/17/2010 1338   NITRITE POSITIVE (A) 11/11/2016 1510   LEUKOCYTESUR SMALL (A) 11/11/2016 1510   Sepsis Labs Invalid input(s): PROCALCITONIN,  WBC,  LACTICIDVEN Microbiology Recent Results (from the past 240 hour(s))  Culture, blood (Routine X 2) w Reflex to ID Panel     Status: Abnormal   Collection Time: 11/11/16  2:15 PM  Result Value Ref Range Status   Specimen Description BLOOD LEFT ANTECUBITAL  Final   Special Requests   Final    BOTTLES DRAWN AEROBIC AND ANAEROBIC Blood  Culture adequate volume   Culture  Setup Time   Final    GRAM NEGATIVE RODS IN BOTH AEROBIC AND ANAEROBIC BOTTLES CRITICAL RESULT CALLED TO, READ BACK BY AND VERIFIED WITH: Alona Bene PHARMD 0304 11/12/16 A BROWNING    Culture ESCHERICHIA COLI (A)  Final   Report Status 11/14/2016 FINAL  Final   Organism ID, Bacteria ESCHERICHIA COLI  Final      Susceptibility   Escherichia coli - MIC*    AMPICILLIN 8 SENSITIVE Sensitive     CEFAZOLIN <=4 SENSITIVE Sensitive     CEFEPIME <=1 SENSITIVE Sensitive     CEFTAZIDIME <=1 SENSITIVE Sensitive     CEFTRIAXONE <=1 SENSITIVE Sensitive     CIPROFLOXACIN <=0.25 SENSITIVE Sensitive     GENTAMICIN <=1 SENSITIVE Sensitive     IMIPENEM <=0.25 SENSITIVE Sensitive     TRIMETH/SULFA <=20 SENSITIVE Sensitive     AMPICILLIN/SULBACTAM <=2 SENSITIVE Sensitive     PIP/TAZO <=4 SENSITIVE Sensitive     Extended ESBL NEGATIVE Sensitive     * ESCHERICHIA COLI  Blood Culture ID Panel (Reflexed)     Status: Abnormal   Collection Time: 11/11/16  2:15 PM  Result Value Ref Range Status   Enterococcus species NOT DETECTED NOT DETECTED Final   Listeria monocytogenes NOT DETECTED NOT DETECTED Final    Staphylococcus species NOT DETECTED NOT DETECTED Final   Staphylococcus aureus NOT DETECTED NOT DETECTED Final   Streptococcus species NOT DETECTED NOT DETECTED Final   Streptococcus agalactiae NOT DETECTED NOT DETECTED Final   Streptococcus pneumoniae NOT DETECTED NOT DETECTED Final   Streptococcus pyogenes NOT DETECTED NOT DETECTED Final   Acinetobacter baumannii NOT DETECTED NOT DETECTED Final   Enterobacteriaceae species DETECTED (A) NOT DETECTED Final    Comment: Enterobacteriaceae represent a large family of gram-negative bacteria, not a single organism. CRITICAL RESULT CALLED TO, READ BACK BY AND VERIFIED WITH: V BRYK PHARMD 0304 11/12/16 A BROWNING    Enterobacter cloacae complex NOT DETECTED NOT DETECTED Final   Escherichia coli DETECTED (A) NOT DETECTED Final    Comment: CRITICAL RESULT CALLED TO, READ BACK BY AND VERIFIED WITH: V BRYK PHARMD 0304 11/12/16 A BROWNING    Klebsiella oxytoca NOT DETECTED NOT DETECTED Final   Klebsiella pneumoniae NOT DETECTED NOT DETECTED Final   Proteus species NOT DETECTED NOT DETECTED Final   Serratia marcescens NOT DETECTED NOT DETECTED Final   Carbapenem resistance NOT DETECTED NOT DETECTED Final   Haemophilus influenzae NOT DETECTED NOT DETECTED Final   Neisseria meningitidis NOT DETECTED NOT DETECTED Final   Pseudomonas aeruginosa NOT DETECTED NOT DETECTED Final   Candida albicans NOT DETECTED NOT DETECTED Final   Candida glabrata NOT DETECTED NOT DETECTED Final   Candida krusei NOT DETECTED NOT DETECTED Final   Candida parapsilosis NOT DETECTED NOT DETECTED Final   Candida tropicalis NOT DETECTED NOT DETECTED Final  Culture, blood (Routine X 2) w Reflex to ID Panel     Status: Abnormal   Collection Time: 11/11/16  2:58 PM  Result Value Ref Range Status   Specimen Description BLOOD RIGHT ANTECUBITAL  Final   Special Requests   Final    BOTTLES DRAWN AEROBIC AND ANAEROBIC Blood Culture adequate volume   Culture  Setup Time   Final     GRAM NEGATIVE RODS IN BOTH AEROBIC AND ANAEROBIC BOTTLES CRITICAL VALUE NOTED.  VALUE IS CONSISTENT WITH PREVIOUSLY REPORTED AND CALLED VALUE.    Culture (A)  Final    ESCHERICHIA COLI SUSCEPTIBILITIES PERFORMED ON PREVIOUS  CULTURE WITHIN THE LAST 5 DAYS.    Report Status 11/14/2016 FINAL  Final  Urine Culture     Status: Abnormal   Collection Time: 11/11/16  3:10 PM  Result Value Ref Range Status   Specimen Description URINE, RANDOM  Final   Special Requests NONE  Final   Culture >=100,000 COLONIES/mL ESCHERICHIA COLI (A)  Final   Report Status 11/14/2016 FINAL  Final   Organism ID, Bacteria ESCHERICHIA COLI (A)  Final      Susceptibility   Escherichia coli - MIC*    AMPICILLIN 8 SENSITIVE Sensitive     CEFAZOLIN <=4 SENSITIVE Sensitive     CEFTRIAXONE <=1 SENSITIVE Sensitive     CIPROFLOXACIN <=0.25 SENSITIVE Sensitive     GENTAMICIN <=1 SENSITIVE Sensitive     IMIPENEM <=0.25 SENSITIVE Sensitive     NITROFURANTOIN <=16 SENSITIVE Sensitive     TRIMETH/SULFA <=20 SENSITIVE Sensitive     AMPICILLIN/SULBACTAM <=2 SENSITIVE Sensitive     PIP/TAZO <=4 SENSITIVE Sensitive     Extended ESBL NEGATIVE Sensitive     * >=100,000 COLONIES/mL ESCHERICHIA COLI  Respiratory Panel by PCR     Status: None   Collection Time: 11/12/16  3:37 AM  Result Value Ref Range Status   Adenovirus NOT DETECTED NOT DETECTED Final   Coronavirus 229E NOT DETECTED NOT DETECTED Final   Coronavirus HKU1 NOT DETECTED NOT DETECTED Final   Coronavirus NL63 NOT DETECTED NOT DETECTED Final   Coronavirus OC43 NOT DETECTED NOT DETECTED Final   Metapneumovirus NOT DETECTED NOT DETECTED Final   Rhinovirus / Enterovirus NOT DETECTED NOT DETECTED Final   Influenza A NOT DETECTED NOT DETECTED Final   Influenza B NOT DETECTED NOT DETECTED Final   Parainfluenza Virus 1 NOT DETECTED NOT DETECTED Final   Parainfluenza Virus 2 NOT DETECTED NOT DETECTED Final   Parainfluenza Virus 3 NOT DETECTED NOT DETECTED Final    Parainfluenza Virus 4 NOT DETECTED NOT DETECTED Final   Respiratory Syncytial Virus NOT DETECTED NOT DETECTED Final   Bordetella pertussis NOT DETECTED NOT DETECTED Final   Chlamydophila pneumoniae NOT DETECTED NOT DETECTED Final   Mycoplasma pneumoniae NOT DETECTED NOT DETECTED Final  MRSA PCR Screening     Status: None   Collection Time: 11/12/16  3:37 AM  Result Value Ref Range Status   MRSA by PCR NEGATIVE NEGATIVE Final    Comment:        The GeneXpert MRSA Assay (FDA approved for NASAL specimens only), is one component of a comprehensive MRSA colonization surveillance program. It is not intended to diagnose MRSA infection nor to guide or monitor treatment for MRSA infections.      Time coordinating discharge: Over 30 minutes  SIGNED:   Hosie Poisson, MD  [facility] 11/19/2016, 2:36 PM Pager   If 7PM-7AM, please contact night-coverage [email or web address] Password [email or web address]

## 2016-11-20 DIAGNOSIS — A419 Sepsis, unspecified organism: Secondary | ICD-10-CM | POA: Diagnosis not present

## 2016-11-20 DIAGNOSIS — I10 Essential (primary) hypertension: Secondary | ICD-10-CM | POA: Diagnosis not present

## 2016-11-20 DIAGNOSIS — I951 Orthostatic hypotension: Secondary | ICD-10-CM | POA: Diagnosis not present

## 2016-11-20 DIAGNOSIS — I509 Heart failure, unspecified: Secondary | ICD-10-CM | POA: Diagnosis not present

## 2016-11-20 DIAGNOSIS — D696 Thrombocytopenia, unspecified: Secondary | ICD-10-CM | POA: Diagnosis not present

## 2016-11-20 DIAGNOSIS — I350 Nonrheumatic aortic (valve) stenosis: Secondary | ICD-10-CM | POA: Diagnosis not present

## 2016-11-20 DIAGNOSIS — G9341 Metabolic encephalopathy: Secondary | ICD-10-CM | POA: Diagnosis not present

## 2016-11-20 DIAGNOSIS — D693 Immune thrombocytopenic purpura: Secondary | ICD-10-CM

## 2016-11-20 DIAGNOSIS — R748 Abnormal levels of other serum enzymes: Secondary | ICD-10-CM | POA: Diagnosis not present

## 2016-11-20 DIAGNOSIS — N390 Urinary tract infection, site not specified: Secondary | ICD-10-CM | POA: Diagnosis not present

## 2016-11-20 DIAGNOSIS — R6520 Severe sepsis without septic shock: Secondary | ICD-10-CM | POA: Diagnosis not present

## 2016-11-20 DIAGNOSIS — N179 Acute kidney failure, unspecified: Secondary | ICD-10-CM | POA: Diagnosis not present

## 2016-11-20 DIAGNOSIS — M6281 Muscle weakness (generalized): Secondary | ICD-10-CM | POA: Diagnosis not present

## 2016-11-20 DIAGNOSIS — R2689 Other abnormalities of gait and mobility: Secondary | ICD-10-CM | POA: Diagnosis not present

## 2016-11-20 DIAGNOSIS — A4151 Sepsis due to Escherichia coli [E. coli]: Principal | ICD-10-CM

## 2016-11-20 DIAGNOSIS — R1312 Dysphagia, oropharyngeal phase: Secondary | ICD-10-CM | POA: Diagnosis not present

## 2016-11-20 DIAGNOSIS — I471 Supraventricular tachycardia: Secondary | ICD-10-CM | POA: Diagnosis not present

## 2016-11-20 DIAGNOSIS — R41841 Cognitive communication deficit: Secondary | ICD-10-CM | POA: Diagnosis not present

## 2016-11-20 DIAGNOSIS — R278 Other lack of coordination: Secondary | ICD-10-CM | POA: Diagnosis not present

## 2016-11-20 DIAGNOSIS — M1990 Unspecified osteoarthritis, unspecified site: Secondary | ICD-10-CM | POA: Diagnosis not present

## 2016-11-20 DIAGNOSIS — R4182 Altered mental status, unspecified: Secondary | ICD-10-CM | POA: Diagnosis not present

## 2016-11-20 DIAGNOSIS — I2510 Atherosclerotic heart disease of native coronary artery without angina pectoris: Secondary | ICD-10-CM | POA: Diagnosis not present

## 2016-11-20 LAB — CBC
HEMATOCRIT: 32.8 % — AB (ref 39.0–52.0)
HEMOGLOBIN: 10.7 g/dL — AB (ref 13.0–17.0)
MCH: 29.2 pg (ref 26.0–34.0)
MCHC: 32.6 g/dL (ref 30.0–36.0)
MCV: 89.6 fL (ref 78.0–100.0)
Platelets: 182 10*3/uL (ref 150–400)
RBC: 3.66 MIL/uL — ABNORMAL LOW (ref 4.22–5.81)
RDW: 15.5 % (ref 11.5–15.5)
WBC: 11.3 10*3/uL — ABNORMAL HIGH (ref 4.0–10.5)

## 2016-11-20 MED ORDER — GUAIFENESIN-DM 100-10 MG/5ML PO SYRP: 5.0000 mL | ORAL_SOLUTION | ORAL | 0 refills | Status: DC | PRN

## 2016-11-20 MED ORDER — FUROSEMIDE 20 MG PO TABS: 20.0000 mg | ORAL_TABLET | Freq: Every day | ORAL | 11 refills | Status: DC

## 2016-11-20 NOTE — Progress Notes (Addendum)
LCSW following for disposition  Patient medically stable for discharge All clinicals sent to facility and spoke with admissions and agreeable to admit.  Patient going to Room 201 Report:  [phone] RN made aware of plan.  LCSW went by the room to notify family. No one at bedside and per tech, family stepped out.  Will work to locate and make sure they are aware of plan.  Call placed to patient wife and message unable to be left due to mailbox being full. [phone].  Spoke to wife on another line and was able to notify of discharge. She is in agreement and reports no questions.  Patient will transport by EMS. LCSW completed all paperwork.  No other needs.  DC tonight to SNF.  Lane Hacker, MSW Clinical Social Work: [employer] Coverage for :  [phone]

## 2016-11-20 NOTE — Discharge Summary (Signed)
Physician Discharge Summary  Austin Rose IHK:[id number] DOB: 02/29/1926 DOA: 11/11/2016  PCP: Burnis Medin, MD  Admit date: 11/11/2016 Discharge date: 11/20/2016  Time spent:  35 minutes  Recommendations for Outpatient Follow-up:  1. Follow PCP in 1-2 weeks 2. Obtain BMP in 1 week on 11/27/2016   Discharge Diagnoses:  Principal Problem:   Sepsis ([place]) Active Problems:   Acute kidney injury ([place])   CAD (coronary artery disease)   Arthritis   Elevated troponin   Thrombocytopenia, idiopathic (HCC)   Orthostatic hypotension   Acute metabolic encephalopathy   Aortic stenosis, moderate w/ mild pulmonary HTN   Hyperlipemia   Pressure injury of skin   Atrial tachycardia [facility])   Discharge Condition: Stable  Diet recommendation: Heart healthy diet  Filed Weights   11/18/16 0300 11/19/16 0556 11/20/16 0346  Weight: 73.5 kg (162 lb 0.6 oz) 76.2 kg (167 lb 15.9 oz) 77.1 kg (169 lb 15.6 oz)    History of present illness:  83yo M w/ a hx of Orthostatic Hypotensionon Midodrine, Aortic Stenosis, Pulmonary HTN, remote CAD/CABG, arthritis, HLD and Cancer who was sent to the ER after developing progressive confusion (did not recognize family members)in the setting of persistent fevers. He was not speaking but was moving all extremitis. He was incontinent of a large amount of dark colored strong smelling urine.He was admitted for sepsis from UTI  Hospital Course:   Sepsis from Escherichia coli  UTI and bacteremia- patient was started on IV Rocephin for Escherichia coli bacteremia, which was transitioned to cefazolin. Patient discharged on Keflex for 5 more days to complete the course on discharge. Stop date 11/26/16.  Acute metabolic encephalopathy Patient is slowly improving. Patient was started on Seroquel which was stopped on 11/17/2016. He is alert and communicating. He ate [employer] today which his daughter had brought him.  Chronic systolic CHF Patient's echocardiogram  showed EF 40-45%, chest x-ray shows pulmonary edema. Also there was question of pneumonia but patient is afebrile, WBC 11.3, not requiring oxygen, O2 sats 94% on room air. I doubt that patient has pneumonia. Will discharge on Lasix 20 mg by mouth daily.  Hypernatremia Patient was started on IV D5, repeat sodium is 145.  CAD status post CABG Patient had an troponin from demand ischemia and sepsis. Cardiovascular was consulted and patient started on metoprolol for SVT.  Acute kidney injury Resolved, patient's creatinine on 11/18/2016 was 1.24.  Orthostatic hypotension Continue midodrine  Mild thrombocytopenia Resolved, likely from sepsis   Procedures:  None   Consultations:  Cardiology    Discharge Exam: Vitals:   11/20/16 0346 11/20/16 1230  BP: 121/77 126/75  Pulse: 88 64  Resp: 18 18  Temp: 98 F (36.7 C) 98 F (36.7 C)    General: Appears in no acute distress  Cardiovascular -S1-S2, regular Respiratory: Clear to auscultation bilaterally   Discharge Instructions   Discharge Instructions    Diet - low sodium heart healthy    Complete by:  As directed    Diet - low sodium heart healthy    Complete by:  As directed    Discharge instructions    Complete by:  As directed    Follow up with PCP in one week.   Increase activity slowly    Complete by:  As directed      Current Discharge Medication List    START taking these medications   Details  cephALEXin (KEFLEX) 500 MG capsule Take 1 capsule (500 mg total) by mouth 2 (two)  times daily. Qty: 12 capsule, Refills: 0    furosemide (LASIX) 20 MG tablet Take 1 tablet (20 mg total) by mouth daily. Qty: 30 tablet, Refills: 11    guaiFENesin-dextromethorphan (ROBITUSSIN DM) 100-10 MG/5ML syrup Take 5 mLs by mouth every 4 (four) hours as needed for cough. Qty: 118 mL, Refills: 0    metoprolol tartrate (LOPRESSOR) 25 MG tablet Take 1 tablet (25 mg total) by mouth 2 (two) times daily. Qty: 60 tablet, Refills: 0       CONTINUE these medications which have NOT CHANGED   Details  aspirin 325 MG tablet Take 1 tablet (325 mg total) by mouth daily.    Cyanocobalamin (B-12 PO) Take 1 tablet by mouth 2 (two) times daily.    Associated Diagnoses: High risk medication use; Unspecified hereditary and idiopathic peripheral neuropathy; Unspecified essential hypertension    midodrine (PROAMATINE) 5 MG tablet Take 5 mg by mouth 3 (three) times daily with meals. 1 tab  8AM, 1 PM and 1 at 6 PM- Pt gets thru the [place]     simvastatin (ZOCOR) 20 MG tablet Take 0.5 tablets by mouth daily.      STOP taking these medications     gabapentin (NEURONTIN) 300 MG capsule      traZODone (DESYREL) 50 MG tablet        Allergies  Allergen Reactions  . Fentanyl     Confusion   . Gabapentin Other (See Comments)    Behavior changes noted by family  Better when stopped  2015  . Norco [Hydrocodone-Acetaminophen]     Confusion   . Tramadol     confusion  . Penicillins Rash    Rash - years ago per wife    Contact information for follow-up providers    Panosh, Standley Brooking, MD. Schedule an appointment as soon as possible for a visit in 1 week.   Specialties:  Internal Medicine, Pediatrics Contact information: [address] [place] [address] [phone]            Contact information for after-discharge care    Destination    [facility] SN[facility] Follow up.   Specialty:  [place] information: [address] [place] [phone]                   The results of significant diagnostics from this hospitalization (including imaging, microbiology, ancillary and laboratory) are listed below for reference.    Significant Diagnostic Studies: Ct Head Wo Contrast  Result Date: 11/11/2016 CLINICAL DATA:  Altered level of consciousness.  Confusion EXAM: CT HEAD WITHOUT CONTRAST TECHNIQUE: Contiguous axial images were obtained  from the base of the skull through the vertex without intravenous contrast. COMPARISON:  CT head 08/13/2016 FINDINGS: Brain: Moderate atrophy. Chronic microvascular ischemic change in the white matter. Negative for acute infarct, hemorrhage, or mass lesion. No shift of the midline structures. Vascular: Negative for hyperdense vessel. Skull: Negative Sinuses/Orbits: Negative Other: None IMPRESSION: Atrophy and chronic microvascular ischemia.  No acute abnormality. Electronically Signed   By: Franchot Gallo M.D.   On: 11/11/2016 15:26   Dg Chest Port 1 View  Result Date: 11/19/2016 CLINICAL DATA:  83 y/o  M; encounter for cough. EXAM: PORTABLE CHEST 1 VIEW COMPARISON:  11/12/2016 chest radiograph FINDINGS: Stable enlarged cardiac silhouette. Aortic atherosclerosis with calcification. Status post CABG, sternotomy wires are aligned and intact. Interval hazy opacification of lung bases. Right lung apex is obscured by patient's chin. Degenerative changes  of the shoulder joints with rightward superior subluxation of humeral head, unchanged. IMPRESSION: Interval development of hazy opacities and prominent pulmonary marking in the lung bases probably representing pulmonary edema. Underlying pneumonia not excluded. Electronically Signed   By: Kristine Garbe M.D.   On: 11/19/2016 20:23   Dg Chest Port 1 View  Result Date: 11/12/2016 CLINICAL DATA:  Atrial fibrillation. EXAM: PORTABLE CHEST 1 VIEW COMPARISON:  11/11/2016. FINDINGS: Prior CABG. Cardiomegaly with normal pulmonary vascularity . No focal infiltrate . Stable elevation left hemidiaphragm. No pleural effusion or pneumothorax P IMPRESSION: 1. Prior CABG.  Stable cardiomegaly. 2.  Stable elevation left hemidiaphragm . Electronically Signed   By: Marcello Moores  Register   On: 11/12/2016 06:44   Dg Chest Portable 1 View  Result Date: 11/11/2016 CLINICAL DATA:  Altered level of consciousness, patient undergoing sepsis evaluation. History of coronary artery  disease, hypertension, former smoker. EXAM: PORTABLE CHEST 1 VIEW COMPARISON:  AP chest x-ray of Aug 13, 2016. FINDINGS: The lungs are adequately inflated and clear. The heart is top-normal in size. The pulmonary vascularity is normal. There is tortuosity of the ascending and descending thoracic aorta with mural calcification. The patient has undergone previous CABG. The observed bony thorax exhibits no acute abnormality. IMPRESSION: There is no acute cardiopulmonary abnormality.  Previous CABG. Thoracic aortic atherosclerosis. Electronically Signed   By: David  Martinique M.D.   On: 11/11/2016 14:50    Microbiology: Recent Results (from the past 240 hour(s))  Culture, blood (Routine X 2) w Reflex to ID Panel     Status: Abnormal   Collection Time: 11/11/16  2:15 PM  Result Value Ref Range Status   Specimen Description BLOOD LEFT ANTECUBITAL  Final   Special Requests   Final    BOTTLES DRAWN AEROBIC AND ANAEROBIC Blood Culture adequate volume   Culture  Setup Time   Final    GRAM NEGATIVE RODS IN BOTH AEROBIC AND ANAEROBIC BOTTLES CRITICAL RESULT CALLED TO, READ BACK BY AND VERIFIED WITH: Alona Bene PHARMD 0304 11/12/16 A BROWNING    Culture ESCHERICHIA COLI (A)  Final   Report Status 11/14/2016 FINAL  Final   Organism ID, Bacteria ESCHERICHIA COLI  Final      Susceptibility   Escherichia coli - MIC*    AMPICILLIN 8 SENSITIVE Sensitive     CEFAZOLIN <=4 SENSITIVE Sensitive     CEFEPIME <=1 SENSITIVE Sensitive     CEFTAZIDIME <=1 SENSITIVE Sensitive     CEFTRIAXONE <=1 SENSITIVE Sensitive     CIPROFLOXACIN <=0.25 SENSITIVE Sensitive     GENTAMICIN <=1 SENSITIVE Sensitive     IMIPENEM <=0.25 SENSITIVE Sensitive     TRIMETH/SULFA <=20 SENSITIVE Sensitive     AMPICILLIN/SULBACTAM <=2 SENSITIVE Sensitive     PIP/TAZO <=4 SENSITIVE Sensitive     Extended ESBL NEGATIVE Sensitive     * ESCHERICHIA COLI  Blood Culture ID Panel (Reflexed)     Status: Abnormal   Collection Time: 11/11/16  2:15 PM   Result Value Ref Range Status   Enterococcus species NOT DETECTED NOT DETECTED Final   Listeria monocytogenes NOT DETECTED NOT DETECTED Final   Staphylococcus species NOT DETECTED NOT DETECTED Final   Staphylococcus aureus NOT DETECTED NOT DETECTED Final   Streptococcus species NOT DETECTED NOT DETECTED Final   Streptococcus agalactiae NOT DETECTED NOT DETECTED Final   Streptococcus pneumoniae NOT DETECTED NOT DETECTED Final   Streptococcus pyogenes NOT DETECTED NOT DETECTED Final   Acinetobacter baumannii NOT DETECTED NOT DETECTED Final   Enterobacteriaceae species  DETECTED (A) NOT DETECTED Final    Comment: Enterobacteriaceae represent a large family of gram-negative bacteria, not a single organism. CRITICAL RESULT CALLED TO, READ BACK BY AND VERIFIED WITH: V BRYK PHARMD 0304 11/12/16 A BROWNING    Enterobacter cloacae complex NOT DETECTED NOT DETECTED Final   Escherichia coli DETECTED (A) NOT DETECTED Final    Comment: CRITICAL RESULT CALLED TO, READ BACK BY AND VERIFIED WITH: V BRYK PHARMD 0304 11/12/16 A BROWNING    Klebsiella oxytoca NOT DETECTED NOT DETECTED Final   Klebsiella pneumoniae NOT DETECTED NOT DETECTED Final   Proteus species NOT DETECTED NOT DETECTED Final   Serratia marcescens NOT DETECTED NOT DETECTED Final   Carbapenem resistance NOT DETECTED NOT DETECTED Final   Haemophilus influenzae NOT DETECTED NOT DETECTED Final   Neisseria meningitidis NOT DETECTED NOT DETECTED Final   Pseudomonas aeruginosa NOT DETECTED NOT DETECTED Final   Candida albicans NOT DETECTED NOT DETECTED Final   Candida glabrata NOT DETECTED NOT DETECTED Final   Candida krusei NOT DETECTED NOT DETECTED Final   Candida parapsilosis NOT DETECTED NOT DETECTED Final   Candida tropicalis NOT DETECTED NOT DETECTED Final  Culture, blood (Routine X 2) w Reflex to ID Panel     Status: Abnormal   Collection Time: 11/11/16  2:58 PM  Result Value Ref Range Status   Specimen Description BLOOD RIGHT  ANTECUBITAL  Final   Special Requests   Final    BOTTLES DRAWN AEROBIC AND ANAEROBIC Blood Culture adequate volume   Culture  Setup Time   Final    GRAM NEGATIVE RODS IN BOTH AEROBIC AND ANAEROBIC BOTTLES CRITICAL VALUE NOTED.  VALUE IS CONSISTENT WITH PREVIOUSLY REPORTED AND CALLED VALUE.    Culture (A)  Final    ESCHERICHIA COLI SUSCEPTIBILITIES PERFORMED ON PREVIOUS CULTURE WITHIN THE LAST 5 DAYS.    Report Status 11/14/2016 FINAL  Final  Urine Culture     Status: Abnormal   Collection Time: 11/11/16  3:10 PM  Result Value Ref Range Status   Specimen Description URINE, RANDOM  Final   Special Requests NONE  Final   Culture >=100,000 COLONIES/mL ESCHERICHIA COLI (A)  Final   Report Status 11/14/2016 FINAL  Final   Organism ID, Bacteria ESCHERICHIA COLI (A)  Final      Susceptibility   Escherichia coli - MIC*    AMPICILLIN 8 SENSITIVE Sensitive     CEFAZOLIN <=4 SENSITIVE Sensitive     CEFTRIAXONE <=1 SENSITIVE Sensitive     CIPROFLOXACIN <=0.25 SENSITIVE Sensitive     GENTAMICIN <=1 SENSITIVE Sensitive     IMIPENEM <=0.25 SENSITIVE Sensitive     NITROFURANTOIN <=16 SENSITIVE Sensitive     TRIMETH/SULFA <=20 SENSITIVE Sensitive     AMPICILLIN/SULBACTAM <=2 SENSITIVE Sensitive     PIP/TAZO <=4 SENSITIVE Sensitive     Extended ESBL NEGATIVE Sensitive     * >=100,000 COLONIES/mL ESCHERICHIA COLI  Respiratory Panel by PCR     Status: None   Collection Time: 11/12/16  3:37 AM  Result Value Ref Range Status   Adenovirus NOT DETECTED NOT DETECTED Final   Coronavirus 229E NOT DETECTED NOT DETECTED Final   Coronavirus HKU1 NOT DETECTED NOT DETECTED Final   Coronavirus NL63 NOT DETECTED NOT DETECTED Final   Coronavirus OC43 NOT DETECTED NOT DETECTED Final   Metapneumovirus NOT DETECTED NOT DETECTED Final   Rhinovirus / Enterovirus NOT DETECTED NOT DETECTED Final   Influenza A NOT DETECTED NOT DETECTED Final   Influenza B NOT DETECTED NOT DETECTED  Final   Parainfluenza Virus 1  NOT DETECTED NOT DETECTED Final   Parainfluenza Virus 2 NOT DETECTED NOT DETECTED Final   Parainfluenza Virus 3 NOT DETECTED NOT DETECTED Final   Parainfluenza Virus 4 NOT DETECTED NOT DETECTED Final   Respiratory Syncytial Virus NOT DETECTED NOT DETECTED Final   Bordetella pertussis NOT DETECTED NOT DETECTED Final   Chlamydophila pneumoniae NOT DETECTED NOT DETECTED Final   Mycoplasma pneumoniae NOT DETECTED NOT DETECTED Final  MRSA PCR Screening     Status: None   Collection Time: 11/12/16  3:37 AM  Result Value Ref Range Status   MRSA by PCR NEGATIVE NEGATIVE Final    Comment:        The GeneXpert MRSA Assay (FDA approved for NASAL specimens only), is one component of a comprehensive MRSA colonization surveillance program. It is not intended to diagnose MRSA infection nor to guide or monitor treatment for MRSA infections.      Labs: Basic Metabolic Panel:  Recent Labs Lab 11/14/16 0631 11/14/16 1125 11/14/16 2038 11/15/16 0306 11/16/16 0309 11/17/16 0434 11/18/16 0419  NA 140  --   --  143 146* 144 145  K 3.2* 3.1* 3.6 4.2 4.1 4.5 3.6  CL 119*  --   --  119* 123* 119* 119*  CO2 13*  --   --  16* 13* 18* 16*  GLUCOSE 138*  --   --  104* 98 98 82  BUN 57*  --   --  59* 55* 44* 33*  CREATININE 1.73*  --   --  1.72* 1.60* 1.42* 1.24  CALCIUM 8.3*  --   --  8.4* 8.3* 8.3* 8.2*  MG  --  2.1 2.1 2.1  --   --   --    Liver Function Tests:  Recent Labs Lab 11/14/16 0631  AST 58*  ALT 33  ALKPHOS 59  BILITOT 0.4  PROT 5.1*  ALBUMIN 2.2*   No results for input(s): LIPASE, AMYLASE in the last 168 hours. No results for input(s): AMMONIA in the last 168 hours. CBC:  Recent Labs Lab 11/15/16 0306 11/16/16 0309 11/17/16 0536 11/18/16 0419 11/20/16 1055  WBC 12.6* 11.0* 13.3* 16.7* 11.3*  HGB 10.3* 10.8* 10.9* 12.0* 10.7*  HCT 31.1* 33.7* 34.0* 35.7* 32.8*  MCV 87.9 89.2 89.5 90.4 89.6  PLT 173 146* 144* 155 182   Cardiac Enzymes:  Recent Labs Lab  11/15/16 0306  TROPONINI 3.13*        Signed:  Oswald Hillock MD.  [facility] 11/20/2016, 1:47 PM

## 2016-11-20 NOTE — Progress Notes (Signed)
Patient is DNI, confirmed with patient's daughter.

## 2016-11-20 NOTE — Progress Notes (Signed)
Physical Therapy Treatment Patient Details Name: Austin Rose MRN: [id number] DOB: 1925-09-15 Today's Date: 11/20/2016    History of Present Illness Patient is a 83 y/o male who was admitted with urosepsis, fever, hypotension, tachycardia and acute renal failure. + UTI. PMH includes HTN, HLD, CAD with CABG, orthostatic hypotension, back surgery, TIA.    PT Comments    Pt is surprisingly motivated after a lethargic couple days, able to walk with significant assistance and close guard of chair due to extended bedrest and limited recent activity.  His plan is transition to SNF and will continue acutely to decrease need for lengthy stay there by focusing on gait/balance safety and strength of LE's.     Follow Up Recommendations  SNF     Equipment Recommendations  None recommended by PT    Recommendations for Other Services       Precautions / Restrictions Precautions Precautions: Fall Precaution Comments: vertigo hx Restrictions Weight Bearing Restrictions: No    Mobility  Bed Mobility Overal bed mobility: Needs Assistance Bed Mobility: Supine to Sit     Supine to sit: Min assist;Mod assist     General bed mobility comments: mainly help to sit up under trunk then some pivoting help to side of bed  Transfers Overall transfer level: Needs assistance Equipment used: Rolling walker (2 wheeled) Transfers: Sit to/from Stand Sit to Stand: +2 physical assistance;+2 safety/equipment;Mod assist         General transfer comment: powered up with PT and aide, then steadied him at walker  Ambulation/Gait Ambulation/Gait assistance: +2 physical assistance;+2 safety/equipment;Min assist Ambulation Distance (Feet): 20 Feet Assistive device: Rolling walker (2 wheeled);2 person hand held assist Gait Pattern/deviations: Step-through pattern;Step-to pattern;Wide base of support;Shuffle;Decreased stride length Gait velocity: reduced Gait velocity interpretation: Below normal speed  for age/gender General Gait Details: pt initiated the process once standing despite cues to step to chair   Stairs            Wheelchair Mobility    Modified Rankin (Stroke Patients Only)       Balance Overall balance assessment: Needs assistance Sitting-balance support: Feet supported;Single extremity supported Sitting balance-Leahy Scale: Fair (with RUE on bed)     Standing balance support: Bilateral upper extremity supported Standing balance-Leahy Scale: Fair Standing balance comment: UE's to control dynamic balance                            Cognition Arousal/Alertness: Awake/alert;Lethargic Behavior During Therapy: Impulsive Overall Cognitive Status: Impaired/Different from baseline Area of Impairment: Orientation;Attention;Memory;Following commands;Safety/judgement;Awareness;Problem solving                 Orientation Level: Place;Time;Situation Current Attention Level: Selective Memory: Decreased short-term memory Following Commands: Follows one step commands with increased time Safety/Judgement: Decreased awareness of deficits;Decreased awareness of safety Awareness: Intellectual Problem Solving: Slow processing;Difficulty sequencing;Requires verbal cues;Requires tactile cues General Comments: pt is initiating attempts at gait once up      Exercises      General Comments        Pertinent Vitals/Pain Pain Assessment: No/denies pain    Home Living                      Prior Function            PT Goals (current goals can now be found in the care plan section) Progress towards PT goals: Progressing toward goals    Frequency  Min 3X/week      PT Plan Current plan remains appropriate    Co-evaluation              AM-PAC PT "6 Clicks" Daily Activity  Outcome Measure  Difficulty turning over in bed (including adjusting bedclothes, sheets and blankets)?: Total Difficulty moving from lying on back to  sitting on the side of the bed? : Total Difficulty sitting down on and standing up from a chair with arms (e.g., wheelchair, bedside commode, etc,.)?: Total Help needed moving to and from a bed to chair (including a wheelchair)?: A Little Help needed walking in hospital room?: A Lot Help needed climbing 3-5 steps with a railing? : Total 6 Click Score: 9    End of Session Equipment Utilized During Treatment: Gait belt Activity Tolerance: Patient tolerated treatment well Patient left: in chair;with call bell/phone within reach;with chair alarm set;with family/visitor present Nurse Communication: Mobility status PT Visit Diagnosis: Dizziness and giddiness (R42);Unsteadiness on feet (R26.81);Muscle weakness (generalized) (M62.81);Difficulty in walking, not elsewhere classified (R26.2)     Time: 1104-1130 PT Time Calculation (min) (ACUTE ONLY): 26 min  Charges:  $Gait Training: 8-22 mins $Therapeutic Activity: 8-22 mins                    G Codes:  Functional Assessment Tool Used: AM-PAC 6 Clicks Basic Mobility   Ramond Dial 11/20/2016, 12:12 PM   Mee Hives, PT MS Acute Rehab Dept. Number: [place] and [place]

## 2016-11-20 NOTE — Progress Notes (Signed)
Attempted to call [facility] SNF to give report, but no answer.   Pt was discharged and transferred via [place]. IV was self-removed by pt; telemetry removed.  Pt in stable condition, and asleep at time of transfer.

## 2016-11-20 NOTE — Consult Note (Signed)
[place] Nurse wound consult note Reason for Consult: sacral pressure injury Wound type: no pressure injury identified at the time of my assessment Pressure Injury POA:No Measurement:NA  Re consult if needed, will not follow at this time. Thanks  Xiara Knisley [employer], RN,CWOCN, CNS, [place] [phone])

## 2016-11-20 NOTE — Progress Notes (Signed)
Patient less combative this am. IV access was reestablished and dose of antibiotic was given. Patient continues to pull at tubes. Hand mitts in place. No complaints at this time. Patient is alert and oriented x1.

## 2016-11-21 DIAGNOSIS — G9341 Metabolic encephalopathy: Secondary | ICD-10-CM | POA: Diagnosis not present

## 2016-11-21 DIAGNOSIS — I471 Supraventricular tachycardia: Secondary | ICD-10-CM | POA: Diagnosis not present

## 2016-11-21 DIAGNOSIS — N390 Urinary tract infection, site not specified: Secondary | ICD-10-CM | POA: Diagnosis not present

## 2016-11-21 DIAGNOSIS — I951 Orthostatic hypotension: Secondary | ICD-10-CM | POA: Diagnosis not present

## 2016-11-22 DIAGNOSIS — D696 Thrombocytopenia, unspecified: Secondary | ICD-10-CM | POA: Diagnosis not present

## 2016-11-22 DIAGNOSIS — N390 Urinary tract infection, site not specified: Secondary | ICD-10-CM | POA: Diagnosis not present

## 2016-11-22 DIAGNOSIS — I350 Nonrheumatic aortic (valve) stenosis: Secondary | ICD-10-CM | POA: Diagnosis not present

## 2016-11-22 DIAGNOSIS — N179 Acute kidney failure, unspecified: Secondary | ICD-10-CM | POA: Diagnosis not present

## 2016-11-22 DIAGNOSIS — I509 Heart failure, unspecified: Secondary | ICD-10-CM | POA: Diagnosis not present

## 2016-11-22 DIAGNOSIS — A419 Sepsis, unspecified organism: Secondary | ICD-10-CM | POA: Diagnosis not present

## 2016-11-22 DIAGNOSIS — I10 Essential (primary) hypertension: Secondary | ICD-10-CM | POA: Diagnosis not present

## 2016-11-22 DIAGNOSIS — M1990 Unspecified osteoarthritis, unspecified site: Secondary | ICD-10-CM | POA: Diagnosis not present

## 2016-11-27 ENCOUNTER — Other Ambulatory Visit: Payer: Self-pay | Admitting: *Deleted

## 2016-11-27 NOTE — Patient Outreach (Signed)
[facility] [facility]) Care Management  11/27/2016  LUISANTONIO ADINOLFI [birth year] [phone]  Met with patient at bedside.  Patient reports that he lives with his spouse, he still drives and manages his medications.  RNCM left a [facility] packet for patient to review with his wife.  Patient will review and consider program.  RNCM discussed phone call follow up and patient agrees that phone calls would be fine if indicated.   Royetta Crochet. Laymond Purser, RN, BSN, [place] [phone]) Business Cell  [phone]) Toll Free Office

## 2016-11-29 ENCOUNTER — Telehealth: Payer: Self-pay | Admitting: Internal Medicine

## 2016-11-29 DIAGNOSIS — I5022 Chronic systolic (congestive) heart failure: Secondary | ICD-10-CM | POA: Diagnosis not present

## 2016-11-29 DIAGNOSIS — N179 Acute kidney failure, unspecified: Secondary | ICD-10-CM | POA: Diagnosis not present

## 2016-11-29 DIAGNOSIS — M150 Primary generalized (osteo)arthritis: Secondary | ICD-10-CM | POA: Diagnosis not present

## 2016-11-29 DIAGNOSIS — I110 Hypertensive heart disease with heart failure: Secondary | ICD-10-CM | POA: Diagnosis not present

## 2016-11-29 DIAGNOSIS — I2510 Atherosclerotic heart disease of native coronary artery without angina pectoris: Secondary | ICD-10-CM | POA: Diagnosis not present

## 2016-11-29 DIAGNOSIS — R531 Weakness: Secondary | ICD-10-CM | POA: Diagnosis not present

## 2016-11-29 NOTE — Telephone Encounter (Signed)
Please advise 

## 2016-11-29 NOTE — Telephone Encounter (Signed)
Sreejes a PT with [name] called to let Panosh know he saw the patient today and wants to see him 2x a week for 4 weeks, then 1 x a week for 2 weeks.  There is OT coming on Monday.  They are needing verbal authorization for this.  Please call [phone].

## 2016-12-02 ENCOUNTER — Telehealth: Payer: Self-pay | Admitting: Internal Medicine

## 2016-12-02 NOTE — Telephone Encounter (Signed)
Don with [facility] would like Dr Regis Bill to know pt has declined need for additional OT services. Home exercise program in place.

## 2016-12-02 NOTE — Telephone Encounter (Signed)
FYI

## 2016-12-03 NOTE — Telephone Encounter (Signed)
Sreejes with [name] is calling to get verbal orders for PT and Home Health nurse care evaluation.  Dr. Regis Bill - Please advise. Thanks!

## 2016-12-03 NOTE — Telephone Encounter (Signed)
Cannot order until I see him  By rules of face to face .  He Has appt next week and I can approve the orders then

## 2016-12-04 NOTE — Telephone Encounter (Signed)
Spoke with [facility] and advised.

## 2016-12-10 ENCOUNTER — Ambulatory Visit (INDEPENDENT_AMBULATORY_CARE_PROVIDER_SITE_OTHER): Payer: Medicare HMO | Admitting: Internal Medicine

## 2016-12-10 ENCOUNTER — Encounter: Payer: Self-pay | Admitting: Internal Medicine

## 2016-12-10 VITALS — BP 110/60 | HR 64 | Temp 97.5°F | Wt 155.6 lb

## 2016-12-10 DIAGNOSIS — Z79899 Other long term (current) drug therapy: Secondary | ICD-10-CM | POA: Diagnosis not present

## 2016-12-10 DIAGNOSIS — N179 Acute kidney failure, unspecified: Secondary | ICD-10-CM | POA: Diagnosis not present

## 2016-12-10 DIAGNOSIS — D696 Thrombocytopenia, unspecified: Secondary | ICD-10-CM

## 2016-12-10 DIAGNOSIS — G609 Hereditary and idiopathic neuropathy, unspecified: Secondary | ICD-10-CM

## 2016-12-10 DIAGNOSIS — G2581 Restless legs syndrome: Secondary | ICD-10-CM | POA: Diagnosis not present

## 2016-12-10 DIAGNOSIS — I959 Hypotension, unspecified: Secondary | ICD-10-CM

## 2016-12-10 DIAGNOSIS — Z09 Encounter for follow-up examination after completed treatment for conditions other than malignant neoplasm: Secondary | ICD-10-CM

## 2016-12-10 DIAGNOSIS — Z23 Encounter for immunization: Secondary | ICD-10-CM | POA: Diagnosis not present

## 2016-12-10 DIAGNOSIS — R54 Age-related physical debility: Secondary | ICD-10-CM | POA: Diagnosis not present

## 2016-12-10 LAB — CBC WITH DIFFERENTIAL/PLATELET
Basophils Absolute: 0.1 10*3/uL (ref 0.0–0.1)
Basophils Relative: 0.8 % (ref 0.0–3.0)
EOS ABS: 0.2 10*3/uL (ref 0.0–0.7)
Eosinophils Relative: 2.8 % (ref 0.0–5.0)
HCT: 36.4 % — ABNORMAL LOW (ref 39.0–52.0)
HEMOGLOBIN: 11.6 g/dL — AB (ref 13.0–17.0)
Lymphocytes Relative: 19.3 % (ref 12.0–46.0)
Lymphs Abs: 1.6 10*3/uL (ref 0.7–4.0)
MCHC: 32 g/dL (ref 30.0–36.0)
MCV: 90.6 fl (ref 78.0–100.0)
MONO ABS: 0.5 10*3/uL (ref 0.1–1.0)
Monocytes Relative: 6.5 % (ref 3.0–12.0)
Neutro Abs: 6 10*3/uL (ref 1.4–7.7)
Neutrophils Relative %: 70.6 % (ref 43.0–77.0)
Platelets: 283 10*3/uL (ref 150.0–400.0)
RBC: 4.02 Mil/uL — AB (ref 4.22–5.81)
RDW: 16.1 % — AB (ref 11.5–15.5)
WBC: 8.5 10*3/uL (ref 4.0–10.5)

## 2016-12-10 LAB — BASIC METABOLIC PANEL
BUN: 13 mg/dL (ref 6–23)
CO2: 29 mEq/L (ref 19–32)
CREATININE: 1.23 mg/dL (ref 0.40–1.50)
Calcium: 9 mg/dL (ref 8.4–10.5)
Chloride: 105 mEq/L (ref 96–112)
GFR: 58.84 mL/min — AB (ref >60.00)
Glucose, Bld: 94 mg/dL (ref 70–99)
Potassium: 4 mEq/L (ref 3.5–5.1)
Sodium: 141 mEq/L (ref 135–145)

## 2016-12-10 NOTE — Patient Instructions (Addendum)
Glad you are doing better. Please make appointment with your cardiologist. Attend to good nutrition.Okay to take the gabapentin at night if it helps to sleep with your burning feet but be cautious because of side effects. Risk   Okay to stay off the Lasix at this time however check your weight daily and if having any increase over a few days or swelling contact the office or cardiology. Depending on results and how you were doing follow-up in 2 months.  Wt Readings from Last 3 Encounters:  12/10/16 155 lb 9.6 oz (70.6 kg)  11/20/16 169 lb 15.6 oz (77.1 kg)  08/13/16 163 lb (73.9 kg)   BP Readings from Last 3 Encounters:  12/10/16 110/60  11/20/16 (!) 165/64  08/13/16 137/78

## 2016-12-10 NOTE — Progress Notes (Signed)
Chief Complaint  Patient presents with  . Post-op Follow-up    HPI: Austin Rose 83 y.o. come in for   Post hosp and rehab   For uti  E coli sepsis  and aki abd  and   encephalopathy  among others    See dc summary below   I have not recrods from rehab   Was tehr a week and family wife and son  Austin Rose him home early because they felt they can do a better job taking care of him at home. When he left the hospital he had swollen legs especially on the left that was very problematic. Instead time given taking care of his legs elevating massage moisturizing had been on Lasix every day 20 mg the wife stopped it after weaning over the last 4-5 days because he was having to go to the bathroom a lot and not making it on time causing incontinence and difficulties. Since they stopped the Lasix things a bit better. Denies chest pain shortness of breath. Bleeding recnet falls  Uses walker Has underlying coronary disease had an elevated troponin during the sepsis episode question demand ischemia.No blood follow-up with done in the hospital. Is not on narcotics. Is on Mitodrine , simvastatin, metoprolol, and and is begun back on nightly gabapentin 300 mg because otherwise he can't sleep because of burning feet. He is also on 2500 of B12 oral. He no longer using  Narcotic medications    Admit date: 11/11/2016 Discharge date: 11/20/2016  Time spent:  35 minutes  Recommendations for Outpatient Follow-up:  1. Follow PCP in 1-2 weeks 2. Obtain BMP in 1 week on 11/27/2016   Discharge Diagnoses:  Principal Problem:   Sepsis ([place]) Active Problems:   Acute kidney injury ([place])   CAD (coronary artery disease)   Arthritis   Elevated troponin   Thrombocytopenia, idiopathic (HCC)   Orthostatic hypotension   Acute metabolic encephalopathy   Aortic stenosis, moderate w/ mild pulmonary HTN   Hyperlipemia   Pressure injury of skin   Atrial tachycardia ([place])   Discharge Condition: Stable  Diet  recommendation: Heart healthy diet ROS: See pertinent positives and negatives per HPI.  Past Medical History:  Diagnosis Date  . Arthritis   . CAD (coronary artery disease)   . Cancer ([place])   . History of stress test 09/20/2008  . Hx of echocardiogram 07/20/2010   The cavity size was normal, there is mild aortic stenosis,the aortic root was normal is size, the ascending aorta was normal in size.  [name] Hx of varicella   . Hyperlipemia   . Hypertension   . Orthostatic hypotension    [facility]    Family History  Problem Relation Age of Onset  . Other Mother 61       brain aneursym  . Anuerysm Mother   . Heart attack Father     Social History   Social History  . Marital status: Married    Spouse name: [name]  . Number of children: [name]  . Years of education: [name]   Social History Main Topics  . Smoking status: Former Research scientist (life sciences)  . Smokeless tobacco: Current User    Types: Chew    Last attempt to quit: 04/15/1978  . Alcohol use No  . Drug use: No  . Sexual activity: No   Other Topics Concern  . 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   2 use tobacco no significant alcohol   No ets   Has fa   Son moved in to help with caretaking                 Outpatient Medications Prior to Visit  Medication Sig Dispense Refill  . aspirin 325 MG tablet Take 1 tablet (325 mg total) by mouth daily.    . Cyanocobalamin (B-12 PO) Take 1 tablet by mouth 2 (two) times daily.     . metoprolol tartrate (LOPRESSOR) 25 MG tablet Take 1 tablet (25 mg total) by mouth 2 (two) times daily. 60 tablet 0  . midodrine (PROAMATINE) 5 MG tablet Take 5 mg by mouth 3 (three) times daily with meals. 1 tab  8AM, 1 PM and 1 at 6 PM- Pt gets thru the [place]     . simvastatin (ZOCOR) 20 MG tablet Take 0.5 tablets by mouth daily.    . furosemide (LASIX) 20 MG tablet Take 1  tablet (20 mg total) by mouth daily. (Patient not taking: Reported on 12/10/2016) 30 tablet 11  . guaiFENesin-dextromethorphan (ROBITUSSIN DM) 100-10 MG/5ML syrup Take 5 mLs by mouth every 4 (four) hours as needed for cough. (Patient not taking: Reported on 12/10/2016) 118 mL 0   No facility-administered medications prior to visit.      EXAM:  BP 110/60 (BP Location: Right Arm, Patient Position: Sitting, Cuff Size: Normal)   Pulse 64   Temp (!) 97.5 F (36.4 C) (Oral)   Wt 155 lb 9.6 oz (70.6 kg)   BMI 26.71 kg/m   Body mass index is 26.71 kg/m.  GENERAL: vitals reviewed and listed above, alert, oriented, appears well hydrated and in no acute distress independent walking with walker  HEENT: atraumatic, conjunctiva  clear, no obvious abnormalities on inspection of external nose and ears OP : no lesion edema or exudate  No teeth in  NECK: no obvious masses on inspection palpation  LUNGS: clear to auscultation bilaterally, no wheezes, rales or rhonchi, good air movement CV: HRRR, no clubbing cyanosis left foot mild  peripheral edema nl cap refill  Fading skin redness  And scaling left medical foot  MS: moves all extremities without noticeable focal  Abnormality otherwise  PSYCH: pleasant and cooperative, no obvious depression or anxiety  Memory not tested  Skin on petechia  Scratches  And spots  In various stages of healing Lab Results  Component Value Date   WBC 8.5 12/10/2016   HGB 11.6 (L) 12/10/2016   HCT 36.4 (L) 12/10/2016   PLT 283.0 12/10/2016   GLUCOSE 94 12/10/2016   CHOL 88 04/18/2016   TRIG 41 04/18/2016   HDL 37 (L) 04/18/2016   LDLCALC 43 04/18/2016   ALT 33 11/14/2016   AST 58 (H) 11/14/2016   NA 141 12/10/2016   K 4.0 12/10/2016   CL 105 12/10/2016   CREATININE 1.23 12/10/2016   BUN 13 12/10/2016   CO2 29 12/10/2016   TSH 3.232 11/12/2016   PSA 0.49 Test Methodology: Hy[employer]ch PSA 07/18/2010   INR 1.04 07/16/2016   HGBA1C 5.1 04/18/2016   BP Readings  from Last 3 Encounters:  12/10/16 110/60  11/20/16 (!) 165/64  08/13/16 137/78   Wt Readings from Last 3 Encounters:  12/10/16 155 lb 9.6 oz (70.6 kg)  11/20/16 169 lb 15.6 oz (77.1 kg)  08/13/16 163 lb (73.9 kg)    ASSESSMENT AND PLAN:  Discussed the following assessment and plan:  Hypotension, unspecified hypotension type - Plan: Basic metabolic panel, CBC with Differential/Platelet  Hospital discharge follow-up - Plan: Basic metabolic panel, CBC with Differential/Platelet  Medication management - Plan: Basic metabolic panel, CBC with Differential/Platelet  Restless legs - from neuropathy? - Plan: Basic metabolic panel, CBC with Differential/Platelet  Thrombocytopenia (HCC) - Plan: Basic metabolic panel, CBC with Differential/Platelet  Acute kidney injury ([place]) - fu lab needed  - Plan: Basic metabolic panel, CBC with Differential/Platelet  Needs flu shot - Plan: Flu vaccine HIGH DOSE PF  Advanced age  Hereditary and idiopathic peripheral neuropathy Wife and son in visit today wife doesn't feel that home health is needed at this time they're doing the exercises that physical therapy has discussed. He states he is eating pretty well warned about increasing edema or weight changes may need to go back on the Lasix. We'll have them make appointment with her cardiologist suspect he had demand ischemic difficulty when he was in the hospital for sepsis.Warned about side effects of gabapentin but states he cannot sleep without it Fortunately he is not taking narcotics anymore.He is taking Midrin as he has for years for orthostatic dizziness. Lab today plan follow-up in 2 months or earlier if needed. Total visit 40 mins > 50% spent counseling and coordinating care as indicated in above note and in instructions to patient .  Ok to not order hh for now but  If needed  As fu needed. Continue exercise nutrition attention etc.   -Patient advised to return or notify health care team  if  new  concerns arise.  Patient Instructions   Glad you are doing better. Please make appointment with your cardiologist. Attend to good nutrition.Okay to take the gabapentin at night if it helps to sleep with your burning feet but be cautious because of side effects. Risk   Okay to stay off the Lasix at this time however check your weight daily and if having any increase over a few days or swelling contact the office or cardiology. Depending on results and how you were doing follow-up in 2 months.  Wt Readings from Last 3 Encounters:  12/10/16 155 lb 9.6 oz (70.6 kg)  11/20/16 169 lb 15.6 oz (77.1 kg)  08/13/16 163 lb (73.9 kg)   BP Readings from Last 3 Encounters:  12/10/16 110/60  11/20/16 (!) 165/64  08/13/16 137/78       Wanda K. Panosh M.D.

## 2016-12-11 NOTE — Telephone Encounter (Signed)
LMTCB for pt/wife

## 2016-12-11 NOTE — Telephone Encounter (Signed)
Patient and wife caretaker  seen yesterday said they don't really need physical therapy at home because they can do it themselves  however you may want to contact them. I'm okay with reordering it if patient and wife wish to do this.

## 2016-12-11 NOTE — Telephone Encounter (Signed)
Sreejes with [name] is calling to get approval for orders for PT 2xwk/4wks. He is supposed to see pt today and would like a call back ASAP.  Dr. Regis Bill - Please advise if ok to approve orders. Thanks!

## 2016-12-12 NOTE — Telephone Encounter (Signed)
LMTCB

## 2016-12-18 NOTE — Telephone Encounter (Signed)
Unable to reach pt/wife. Closing message as they advised Dr.Panosh they did not want in-home PT.

## 2017-01-26 NOTE — Progress Notes (Signed)
Chief Complaint  Patient presents with  . Follow-up    Pt doing well off Lasix, no noted swelling or extreme weight gain.    HPI: Austin Rose 83 y.o. come in for Chronic disease management  With son   Patient gets   Care at the [facility] and labs   And no records available  So  Problematic continuity of care    From history only He has had spepsis and se of the chronic  narcotics he has taken for over 30  Years from prev pcp team .   Discussed staying off meds and med monitoring to avoid over or underdosing Trying gabapentin for nocturnal burning feet [facility]  Every 6-12 mos.  Glasses and buggy . From them.  Eating  On his own schedule  no fall son along caretaking  as well with  His mom .  nocturia is some better but still has night incontinence and sometimes  watches tv at night.   Has sore spot on back side  Using donut but not comfortable all the time . No fever weeping.  Sleep      Nocturia   And    sitt  With TV.   ROS: See pertinent positives and negatives per HPI. No recent fall   Past Medical History:  Diagnosis Date  . Arthritis   . CAD (coronary artery disease)   . Cancer ([place])   . History of stress test 09/20/2008  . Hx of echocardiogram 07/20/2010   The cavity size was normal, there is mild aortic stenosis,the aortic root was normal is size, the ascending aorta was normal in size.  [name] Hx of varicella   . Hyperlipemia   . Hypertension   . Orthostatic hypotension    [facility]    Family History  Problem Relation Age of Onset  . Other Mother 60       brain aneursym  . Anuerysm Mother   . Heart attack Father     Social History   Social History  . Marital status: Married    Spouse name: [name]  . Number of children: [name]  . Years of education: [name]   Social History Main Topics  . Smoking status: Former Research scientist (life sciences)  . Smokeless tobacco: Current User    Types: Chew    Last attempt to quit: 04/15/1978  . Alcohol use No  . Drug use: No  . Sexual activity: No    Other Topics Concern  . 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   2 use tobacco no significant alcohol   No ets   Has fa   Son moved in to help with caretaking                 Outpatient Medications Prior to Visit  Medication Sig Dispense Refill  . aspirin 325 MG tablet Take 1 tablet (325 mg total) by mouth daily.    [name] gabapentin (NEURONTIN) 300 MG capsule Take 300 mg by mouth 3 (three) times daily.    . metoprolol tartrate (LOPRESSOR) 25 MG tablet Take 1 tablet (25 mg total) by mouth 2 (two) times daily. 60 tablet 0  . midodrine (PROAMATINE) 5 MG tablet Take 5 mg by mouth 3 (three) times daily with meals.  1 tab  8AM, 1 PM and 1 at 6 PM- Pt gets thru the [place]     . simvastatin (ZOCOR) 20 MG tablet Take 0.5 tablets by mouth daily.    . Cyanocobalamin (B-12 PO) Take 1 tablet by mouth 2 (two) times daily.     . furosemide (LASIX) 20 MG tablet Take 1 tablet (20 mg total) by mouth daily. (Patient not taking: Reported on 12/10/2016) 30 tablet 11  . guaiFENesin-dextromethorphan (ROBITUSSIN DM) 100-10 MG/5ML syrup Take 5 mLs by mouth every 4 (four) hours as needed for cough. (Patient not taking: Reported on 12/10/2016) 118 mL 0   No facility-administered medications prior to visit.      EXAM:  BP 122/84 (BP Location: Left Arm, Patient Position: Sitting, Cuff Size: Normal)   Pulse 64   Temp 97.6 F (36.4 C) (Oral)   Wt 158 lb 9.6 oz (71.9 kg)   BMI 27.22 kg/m   Body mass index is 27.22 kg/m.  GENERAL: vitals reviewed and listed above, alert, oriented, appears well hydrated and in no acute distress  Using cane  HEENT: atraumatic, conjunctiva  clear, no obvious abnormalities on inspection of external nose and ears  NECK: no obvious masses on inspection palpation  LUNGS: clear to auscultation bilaterally, no wheezes, rales or rhonchi,  CV: HRRR,  no clubbing cyanosis or  peripheral edema nl cap refill  MS: moves all extremities without noticeable focal  Abnormality  1+ edema   Skin back side  Intergluteal crease with small fissure  Superficial no ulcer  Noted   No weeping  Or dc  No sacra redness otherwise  PSYCH: pleasant and cooperative,  Lab Results  Component Value Date   WBC 8.5 12/10/2016   HGB 11.6 (L) 12/10/2016   HCT 36.4 (L) 12/10/2016   PLT 283.0 12/10/2016   GLUCOSE 94 12/10/2016   CHOL 88 04/18/2016   TRIG 41 04/18/2016   HDL 37 (L) 04/18/2016   LDLCALC 43 04/18/2016   ALT 33 11/14/2016   AST 58 (H) 11/14/2016   NA 141 12/10/2016   K 4.0 12/10/2016   CL 105 12/10/2016   CREATININE 1.23 12/10/2016   BUN 13 12/10/2016   CO2 29 12/10/2016   TSH 3.232 11/12/2016   PSA 0.49 Test Methodology: Hybritech PSA 07/18/2010   INR 1.04 07/16/2016   HGBA1C 5.1 04/18/2016   BP Readings from Last 3 Encounters:  01/27/17 122/84  12/10/16 110/60  11/20/16 (!) 165/64   Wt Readings from Last 3 Encounters:  01/27/17 158 lb 9.6 oz (71.9 kg)  12/10/16 155 lb 9.6 oz (70.6 kg)  11/20/16 169 lb 15.6 oz (77.1 kg)     ASSESSMENT AND PLAN:  Discussed the following assessment and plan:  Nocturia  Medication management  Skin soreness - pressure plus vx poss  moisture  intertrigenous  area of pain  disc keep dry and  local skin care proticetion    Enuresis, nocturnal only  Hereditary and idiopathic peripheral neuropathy  Advanced age  Skin management  And fu  If  persistent or progressive  Ok to stay off lasix  Good nutrition local care   Fu  In  4 mos or as needed not getting better   -Patient advised to return or notify health care team  if  new concerns arise.  Patient Instructions  Glad you are doing better  Can try   topical barriers .  No new medications.     At this time  .  ROV in  4 months    Pressure Injury A pressure injury, sometimes called a bedsore, is an injury to the skin and underlying tissue  caused by pressure. Pressure on blood vessels causes decreased blood flow to the skin, which can eventually cause the skin tissue to die and break down into a wound. Pressure injuries usually occur:  Over bony parts of the body such as the tailbone, shoulders, elbows, hips, and heels.  Under medical devices such as respiratory equipment, stockings, tubes, and splints.  Pressure injuries start as reddened areas on the skin and can lead to pain, muscle damage, and infection. Pressure injuries can vary in severity. What are the causes? Pressure injuries are caused by a lack of blood supply to an area of skin. They can occur from intense pressure over a short period of time or from less intense pressure over a long period of time. What increases the risk? This condition is more likely to develop in people who:  Are in the hospital or an extended care facility.  Are bedridden or in a wheelchair.  Have an injury or disease that keeps them from: ? Moving normally. ? Feeling pain or pressure.  Have a condition that: ? Makes them sleepy or less alert. ? Causes poor blood flow.  Need to wear a medical device.  Have poor control of their bladder or bowel functions (incontinence).  Have poor nutrition (malnutrition).  Are of certain ethnicities. People of African American and Latino or Hispanic descent are at higher risk compared to other ethnic groups.  If you are at risk for pressure ulcers, your health care provider may recommend certain types of bedding to help prevent them. These may include foam or gel mattresses covered with one of the following:  A sheepskin blanket.  A pad that is filled with gel, air, water, or foam.  What are the signs or symptoms? The main symptom is a blister or change in skin color that opens into a wound. Other symptoms include:  Red or dark areas of skin that do not turn white or pale when pressed with a finger.  Pain, warmth, or change of skin  texture.  How is this diagnosed? This condition is diagnosed with a medical history and physical exam. You may also have tests, including:  Blood tests to check for infection or signs of poor nutrition.  Imaging studies to check for damage to the deep tissues under your skin.  Blood flow studies.  Your pressure injury will be staged to determine its severity. Staging is an assessment of:  The depth of the pressure injury.  Which tissues are exposed because of the pressure injury.  The causes of the pressure injury.  How is this treated? The main focus of treatment is to help your injury heal. This may be done by:  Relieving or redistributing pressure on your skin. This includes: ? Frequently changing your position. ? Eliminating or minimizing positions that caused the wound or that can make the wound worse. ? Using specific bed mattresses and chair cushions. ? Refitting, resizing, or replacing any medical devices, or padding the skin under them. ? Using creams or powders to prevent rubbing (friction) on the skin.  Keeping your skin clean and dry. This may include using a skin cleanser or skin protectant as told by your health care provider. This may be a lotion, ointment, or spray.  Cleaning your injury and removing any dead tissue from the wound (debridement).  Placing a bandage (dressing) over your  injury.  Preventing or treating infection. This may include antibiotic, antimicrobial, or antiseptic medicines.  Treatment may also include medicine for pain. Sometimes surgery is needed to close the wound with a flap of healthy skin or a piece of skin from another area of your body (graft). You may need surgery if other treatments are not working or if your injury is very deep. Follow these instructions at home: Wound care  Follow instructions from your health care provider about: ? How to take care of your wound. ? When and how you should change your dressing. ? When you  should remove your dressing. If your dressing is dry and stuck when you try to remove it, moisten or wet the dressing with saline or water so that it can be removed without harming your skin or wound tissue.  Check your wound every day for signs of infection. Have a caregiver do this for you if you are not able. Watch for: ? More redness, swelling, or pain. ? More fluid, blood, or pus. ? A bad smell. Skin Care  Keep your skin clean and dry. Gently pat your skin dry.  Do not rub or massage your skin.  Use a skin protectant only as told by your health care provider.  Check your skin every day for any changes in color or any new blisters or sores (ulcers). Have a caregiver do this for you if you are not able. Medicines  Take over-the-counter and prescription medicines only as told by your health care provider.  If you were prescribed an antibiotic medicine, take it or apply it as told by your health care provider. Do not stop taking or using the antibiotic even if your condition improves. Reducing and Redistributing Pressure  Do not lie or sit in one position for a long time. Move or change position every two hours or as told by your health care provider.  Use pillows or cushions to reduce pressure. Ask your health care provider to recommend cushions or pads for you.  Use medical devices that do not rub your skin. Tell your health care provider if one of your medical devices is causing a pressure injury to develop. General instructions   Eat a healthy diet that includes lots of protein. Ask your health care provider for diet advice.  Drink enough fluid to keep your urine clear or pale yellow.  Be as active as you can every day. Ask your health care provider to suggest safe exercises or activities.  Do not abuse drugs or alcohol.  Keep all follow-up visits as told by your health care provider. This is important.  Do not smoke. Contact a health care provider if:   You have  chills or fever.  Your pain medicine is not helping.  You have any changes in skin color.  You have new blisters or sores.  You develop warmth, redness, or swelling near a pressure injury.  You have a bad odor or pus coming from your pressure injury.  You lose control of your bowels or bladder.  You develop new symptoms.  Your wound does not improve after 1-2 weeks of treatment.  You develop a new medical condition, such as diabetes, peripheral vascular disease, or conditions that affect your defense (immune) system. This information is not intended to replace advice given to you by your health care provider. Make sure you discuss any questions you have with your health care provider. Document Released: 04/01/2005 Document Revised: 09/04/2015 Document Reviewed: 08/10/2014 Elsevier Interactive Patient  Education  2018 [place] Panosh M.D.

## 2017-01-27 ENCOUNTER — Ambulatory Visit (INDEPENDENT_AMBULATORY_CARE_PROVIDER_SITE_OTHER): Payer: Medicare HMO | Admitting: Internal Medicine

## 2017-01-27 ENCOUNTER — Encounter: Payer: Self-pay | Admitting: Internal Medicine

## 2017-01-27 VITALS — BP 122/84 | HR 64 | Temp 97.6°F | Wt 158.6 lb

## 2017-01-27 DIAGNOSIS — R208 Other disturbances of skin sensation: Secondary | ICD-10-CM

## 2017-01-27 DIAGNOSIS — R54 Age-related physical debility: Secondary | ICD-10-CM

## 2017-01-27 DIAGNOSIS — R351 Nocturia: Secondary | ICD-10-CM

## 2017-01-27 DIAGNOSIS — G609 Hereditary and idiopathic neuropathy, unspecified: Secondary | ICD-10-CM

## 2017-01-27 DIAGNOSIS — N3944 Nocturnal enuresis: Secondary | ICD-10-CM

## 2017-01-27 DIAGNOSIS — Z79899 Other long term (current) drug therapy: Secondary | ICD-10-CM

## 2017-01-27 MED ORDER — ZOSTER VAC RECOMB ADJUVANTED 50 MCG/0.5ML IM SUSR: 0.5000 mL | Freq: Once | INTRAMUSCULAR | 1 refills | Status: AC

## 2017-01-27 NOTE — Patient Instructions (Signed)
Glad you are doing better  Can try   topical barriers .  No new medications.     At this time  .  ROV in 4 months    Pressure Injury A pressure injury, sometimes called a bedsore, is an injury to the skin and underlying tissue caused by pressure. Pressure on blood vessels causes decreased blood flow to the skin, which can eventually cause the skin tissue to die and break down into a wound. Pressure injuries usually occur:  Over bony parts of the body such as the tailbone, shoulders, elbows, hips, and heels.  Under medical devices such as respiratory equipment, stockings, tubes, and splints.  Pressure injuries start as reddened areas on the skin and can lead to pain, muscle damage, and infection. Pressure injuries can vary in severity. What are the causes? Pressure injuries are caused by a lack of blood supply to an area of skin. They can occur from intense pressure over a short period of time or from less intense pressure over a long period of time. What increases the risk? This condition is more likely to develop in people who:  Are in the hospital or an extended care facility.  Are bedridden or in a wheelchair.  Have an injury or disease that keeps them from: ? Moving normally. ? Feeling pain or pressure.  Have a condition that: ? Makes them sleepy or less alert. ? Causes poor blood flow.  Need to wear a medical device.  Have poor control of their bladder or bowel functions (incontinence).  Have poor nutrition (malnutrition).  Are of certain ethnicities. People of African American and Latino or Hispanic descent are at higher risk compared to other ethnic groups.  If you are at risk for pressure ulcers, your health care provider may recommend certain types of bedding to help prevent them. These may include foam or gel mattresses covered with one of the following:  A sheepskin blanket.  A pad that is filled with gel, air, water, or foam.  What are the signs or  symptoms? The main symptom is a blister or change in skin color that opens into a wound. Other symptoms include:  Red or dark areas of skin that do not turn white or pale when pressed with a finger.  Pain, warmth, or change of skin texture.  How is this diagnosed? This condition is diagnosed with a medical history and physical exam. You may also have tests, including:  Blood tests to check for infection or signs of poor nutrition.  Imaging studies to check for damage to the deep tissues under your skin.  Blood flow studies.  Your pressure injury will be staged to determine its severity. Staging is an assessment of:  The depth of the pressure injury.  Which tissues are exposed because of the pressure injury.  The causes of the pressure injury.  How is this treated? The main focus of treatment is to help your injury heal. This may be done by:  Relieving or redistributing pressure on your skin. This includes: ? Frequently changing your position. ? Eliminating or minimizing positions that caused the wound or that can make the wound worse. ? Using specific bed mattresses and chair cushions. ? Refitting, resizing, or replacing any medical devices, or padding the skin under them. ? Using creams or powders to prevent rubbing (friction) on the skin.  Keeping your skin clean and dry. This may include using a skin cleanser or skin protectant as told by your health care provider.  This may be a lotion, ointment, or spray.  Cleaning your injury and removing any dead tissue from the wound (debridement).  Placing a bandage (dressing) over your injury.  Preventing or treating infection. This may include antibiotic, antimicrobial, or antiseptic medicines.  Treatment may also include medicine for pain. Sometimes surgery is needed to close the wound with a flap of healthy skin or a piece of skin from another area of your body (graft). You may need surgery if other treatments are not working or  if your injury is very deep. Follow these instructions at home: Wound care  Follow instructions from your health care provider about: ? How to take care of your wound. ? When and how you should change your dressing. ? When you should remove your dressing. If your dressing is dry and stuck when you try to remove it, moisten or wet the dressing with saline or water so that it can be removed without harming your skin or wound tissue.  Check your wound every day for signs of infection. Have a caregiver do this for you if you are not able. Watch for: ? More redness, swelling, or pain. ? More fluid, blood, or pus. ? A bad smell. Skin Care  Keep your skin clean and dry. Gently pat your skin dry.  Do not rub or massage your skin.  Use a skin protectant only as told by your health care provider.  Check your skin every day for any changes in color or any new blisters or sores (ulcers). Have a caregiver do this for you if you are not able. Medicines  Take over-the-counter and prescription medicines only as told by your health care provider.  If you were prescribed an antibiotic medicine, take it or apply it as told by your health care provider. Do not stop taking or using the antibiotic even if your condition improves. Reducing and Redistributing Pressure  Do not lie or sit in one position for a long time. Move or change position every two hours or as told by your health care provider.  Use pillows or cushions to reduce pressure. Ask your health care provider to recommend cushions or pads for you.  Use medical devices that do not rub your skin. Tell your health care provider if one of your medical devices is causing a pressure injury to develop. General instructions   Eat a healthy diet that includes lots of protein. Ask your health care provider for diet advice.  Drink enough fluid to keep your urine clear or pale yellow.  Be as active as you can every day. Ask your health care  provider to suggest safe exercises or activities.  Do not abuse drugs or alcohol.  Keep all follow-up visits as told by your health care provider. This is important.  Do not smoke. Contact a health care provider if:   You have chills or fever.  Your pain medicine is not helping.  You have any changes in skin color.  You have new blisters or sores.  You develop warmth, redness, or swelling near a pressure injury.  You have a bad odor or pus coming from your pressure injury.  You lose control of your bowels or bladder.  You develop new symptoms.  Your wound does not improve after 1-2 weeks of treatment.  You develop a new medical condition, such as diabetes, peripheral vascular disease, or conditions that affect your defense (immune) system. This information is not intended to replace advice given to you by your  health care provider. Make sure you discuss any questions you have with your health care provider. Document Released: 04/01/2005 Document Revised: 09/04/2015 Document Reviewed: 08/10/2014 Elsevier Interactive Patient Education  [employer].

## 2017-07-22 NOTE — Progress Notes (Signed)
Chief Complaint  Patient presents with  . Follow-up    Pt states he is still having noctuira and sometimes wets the bed  in his sleep. Pt has absorbant pads in his bed to catch his urine when this happens. Pt states that his leg swelling is still present.     HPI: Austin Rose 84 y.o. come in for Chronic disease management  Last visit  10 18  Here with  Daughter . Doing pretty well so far   Rudene Anda this weekend to be 89   ambularoy  Slowly and with cane     No using   Teeth .   Not fitting right.  So eating softs foods   And soups.  Has a good appetite when goes out for fish   Seen at [facility] in feb   Blood work and x ray chest  And   No results .   Nutrition    Hearing problem  Not using  Aids  Needs bateries  Will get some Wife pushing water for hydration    Has nocturia  No change wet some nights    Uno uti?   Had UA at the [place]  .    ROS: See pertinent positives and negatives per HPI. No change in neuropathy No se noted of  meds still taking some  bgaba b hx   Past Medical History:  Diagnosis Date  . Acute metabolic encephalopathy 6/43/3295  . Arthritis   . CAD (coronary artery disease)   . Cancer ([place])   . Confusion 07/16/2016  . History of stress test 09/20/2008  . Hx of echocardiogram 07/20/2010   The cavity size was normal, there is mild aortic stenosis,the aortic root was normal is size, the ascending aorta was normal in size.  [name] Hx of varicella   . Hyperlipemia   . Hypertension   . Orthostatic hypotension    [facility]    Family History  Problem Relation Age of Onset  . Other Mother 28       brain aneursym  . Anuerysm Mother   . Heart attack Father     Social History   Socioeconomic History  . Marital status: Married    Spouse name: Not on file  . Number of children: Not on file  . Years of education: Not on file  . Highest education level: Not on file  Occupational History  . Not on file  Social Needs  . Financial resource strain: Not on  file  . Food insecurity:    Worry: Not on file    Inability: Not on file  . Transportation needs:    Medical: Not on file    Non-medical: Not on file  Tobacco Use  . Smoking status: Former Research scientist (life sciences)  . Smokeless tobacco: Current User    Types: Chew    Last attempt to quit: 04/15/1978  Substance and Sexual Activity  . Alcohol use: No  . Drug use: No  . Sexual activity: Never  Lifestyle  . Physical activity:    Days per week: Not on file    Minutes per session: Not on file  . Stress: Not on file  Relationships  . Social connections:    Talks on phone: Not on file    Gets together: Not on file    Attends religious service: Not on file    Active member of club or organization: Not on file    Attends meetings of clubs or organizations:  Not on file    Relationship status: Not on file  Other Topics Concern  . 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   2 use tobacco no significant alcohol   No ets   Has fa   Son moved in to help with caretaking              Outpatient Medications Prior to Visit  Medication Sig Dispense Refill  . acetaminophen (TYLENOL) 500 MG tablet Take 500 mg by mouth every 6 (six) hours as needed.    [name] aspirin 325 MG tablet Take 1 tablet (325 mg total) by mouth daily.    [name] gabapentin (NEURONTIN) 300 MG capsule Take 300 mg by mouth 3 (three) times daily.    . metoprolol tartrate (LOPRESSOR) 25 MG tablet Take 1 tablet (25 mg total) by mouth 2 (two) times daily. 60 tablet 0  . midodrine (PROAMATINE) 5 MG tablet Take 5 mg by mouth 3 (three) times daily with meals. 1 tab  8AM, 1 PM and 1 at 6 PM- Pt gets thru the [place]     . simvastatin (ZOCOR) 20 MG tablet Take 0.5 tablets by mouth daily.     No facility-administered medications prior to visit.      EXAM:  BP 138/68 (BP Location: Left Arm, Patient Position: Sitting,  Cuff Size: Normal)   Pulse 64   Temp 98 F (36.7 C) (Oral)   Wt 161 lb 3.2 oz (73.1 kg)   BMI 27.67 kg/m   Body mass index is 27.67 kg/m.  GENERAL: vitals reviewed and listed above, alert, oriented, appears well hydrated and in no acute distress interactive slightly ehard of hearing  HEENT: atraumatic, conjunctiva  clear, no obvious abnormalities on inspection of external nose and ears OP : no lesion edema or exudate  No teeth NECK: no obvious masses on inspection palpation  LUNGS: clear to auscultation bilaterally, no wheezes, rales or rhonchi, g CV: HRRR, no clubbing cyanosis slight to 1 +  peripheral edema nl cap refill  MS: moves all extremities without noticeable focal  Abnormality flat footed gait  Able  to arise from chair independently but slowly  PSYCH: pleasant and cooperative, Lab Results  Component Value Date   WBC 8.5 12/10/2016   HGB 11.6 (L) 12/10/2016   HCT 36.4 (L) 12/10/2016   PLT 283.0 12/10/2016   GLUCOSE 94 12/10/2016   CHOL 88 04/18/2016   TRIG 41 04/18/2016   HDL 37 (L) 04/18/2016   LDLCALC 43 04/18/2016   ALT 33 11/14/2016   AST 58 (H) 11/14/2016   NA 141 12/10/2016   K 4.0 12/10/2016   CL 105 12/10/2016   CREATININE 1.23 12/10/2016   BUN 13 12/10/2016   CO2 29 12/10/2016   TSH 3.232 11/12/2016   PSA 0.49 Test Methodology: [employer] PSA 07/18/2010   INR 1.04 07/16/2016   HGBA1C 5.1 04/18/2016   BP Readings from Last 3 Encounters:  07/25/17 138/68  01/27/17 122/84  12/10/16 110/60   Wt Readings from Last 3 Encounters:  07/25/17 161 lb 3.2 oz (73.1 kg)  01/27/17 158 lb 9.6 oz (71.9 kg)  12/10/16 155 lb 9.6 oz (70.6 kg)     ASSESSMENT AND PLAN:  Discussed the following assessment and plan:  Hereditary and idiopathic peripheral  neuropathy  Medication management  Multiple lacunar infarcts [facility])  Advanced age  Nocturia  Hearing decreased, unspecified laterality Nutrition and advance directed  Disc   Booklet for advance directive  hc poa and living will for home and wife  To daughter  Daughter could sing up for [facility] my chart   And or get lab results from the [place]  Otherwise since he is doing well  rov in 6 months  Or as needed  Disc  Nocturia and  Fall avoidance   Also  Avoiding cns  meds   -Patient advised to return or notify health care team  if  new concerns arise. Total visit 39mins > 50% spent counseling and coordinating care as indicated in above note and in instructions to patient .     Patient Instructions  Glad you are doing well.  Consider    urology  I f needed. But    Attend to fall prevention.   See if can get on to my chart  .     [facility] and   Get [place] copy of blood or any test results .   congratulations on 89  !    Plan ROV  in 6 months or as needed    Standley Brooking. Patsie Mccardle M.D.

## 2017-07-25 ENCOUNTER — Encounter: Payer: Self-pay | Admitting: Internal Medicine

## 2017-07-25 ENCOUNTER — Ambulatory Visit (INDEPENDENT_AMBULATORY_CARE_PROVIDER_SITE_OTHER): Payer: Medicare HMO | Admitting: Internal Medicine

## 2017-07-25 VITALS — BP 138/68 | HR 64 | Temp 98.0°F | Wt 161.2 lb

## 2017-07-25 DIAGNOSIS — R54 Age-related physical debility: Secondary | ICD-10-CM | POA: Insufficient documentation

## 2017-07-25 DIAGNOSIS — I6381 Other cerebral infarction due to occlusion or stenosis of small artery: Secondary | ICD-10-CM

## 2017-07-25 DIAGNOSIS — R351 Nocturia: Secondary | ICD-10-CM | POA: Diagnosis not present

## 2017-07-25 DIAGNOSIS — Z79899 Other long term (current) drug therapy: Secondary | ICD-10-CM | POA: Diagnosis not present

## 2017-07-25 DIAGNOSIS — G609 Hereditary and idiopathic neuropathy, unspecified: Secondary | ICD-10-CM

## 2017-07-25 DIAGNOSIS — H9190 Unspecified hearing loss, unspecified ear: Secondary | ICD-10-CM | POA: Diagnosis not present

## 2017-07-25 NOTE — Patient Instructions (Addendum)
Glad you are doing well.  Consider    urology  I f needed. But    Attend to fall prevention.   See if can get on to my chart  .     [facility] and   Get [place] copy of blood or any test results .   congratulations on 89  !    Plan ROV  in 6 months or as needed

## 2017-09-25 ENCOUNTER — Ambulatory Visit: Payer: Self-pay | Admitting: *Deleted

## 2017-09-25 NOTE — Telephone Encounter (Signed)
Patient's wife called with patient having a cough with chest congestion 3-4 days now. Denies fever/SOB/CP. The cough is non productive but he has a throaty rattle with cough. Taking mucinex for 2 days with slight improvement today. Stated his breathing sounds normal, no wheeze. Advice care reviewed including continue mucinex, encourage fluids, turn on humidifier. urgent s/sx discussed that would require immediate treatment. Appointment made for tomorrow morning.   Answer Assessment - Initial Assessment Questions 1. ONSET: "When did the cough begin?"      3-4 days ago 2. SEVERITY: "How bad is the cough today?"      Rattle cough in chest. 3. RESPIRATORY DISTRESS: "Describe your breathing."      normal 4. FEVER: "Do you have a fever?" If so, ask: "What is your temperature, how was it measured, and when did it start?"     no 5. HEMOPTYSIS: "Are you coughing up any blood?" If so ask: "How much?" (flecks, streaks, tablespoons, etc.)     no 6. TREATMENT: "What have you done so far to treat the cough?" (e.g., meds, fluids, humidifier)     Will start humidifier today. Taking mucinex for 2 days.  7. CARDIAC HISTORY: "Do you have any history of heart disease?" (e.g., heart attack, congestive heart failure)      Heart surgery 8. LUNG HISTORY: "Do you have any history of lung disease?"  (e.g., pulmonary embolus, asthma, emphysema)     no 9. PE RISK FACTORS: "Do you have a history of blood clots?" (or: recent major surgery, recent prolonged travel, bedridden )    no 10. OTHER SYMPTOMS: "Do you have any other symptoms? (e.g., runny nose, wheezing, chest pain)       Was sneezing for 3 days but today is better. 11. PREGNANCY: "Is there any chance you are pregnant?" "When was your last menstrual period?"       na 12. TRAVEL: "Have you traveled out of the country in the last month?" (e.g., travel history, exposures)       no  Protocols used: COUGH - ACUTE NON-PRODUCTIVE-A-AH

## 2017-09-26 ENCOUNTER — Encounter: Payer: Self-pay | Admitting: Internal Medicine

## 2017-09-26 ENCOUNTER — Ambulatory Visit (INDEPENDENT_AMBULATORY_CARE_PROVIDER_SITE_OTHER): Payer: Medicare HMO | Admitting: Internal Medicine

## 2017-09-26 ENCOUNTER — Ambulatory Visit (INDEPENDENT_AMBULATORY_CARE_PROVIDER_SITE_OTHER)
Admission: RE | Admit: 2017-09-26 | Discharge: 2017-09-26 | Disposition: A | Discharge status: Home / Self Care | Payer: Medicare HMO | Source: Ambulatory Visit | Attending: Internal Medicine | Admitting: Internal Medicine

## 2017-09-26 VITALS — BP 110/64 | HR 62 | Temp 97.5°F | Ht 64.0 in | Wt 154.2 lb

## 2017-09-26 DIAGNOSIS — R05 Cough: Secondary | ICD-10-CM | POA: Diagnosis not present

## 2017-09-26 DIAGNOSIS — I2510 Atherosclerotic heart disease of native coronary artery without angina pectoris: Secondary | ICD-10-CM

## 2017-09-26 DIAGNOSIS — R058 Other specified cough: Secondary | ICD-10-CM

## 2017-09-26 DIAGNOSIS — R54 Age-related physical debility: Secondary | ICD-10-CM

## 2017-09-26 DIAGNOSIS — I2583 Coronary atherosclerosis due to lipid rich plaque: Secondary | ICD-10-CM | POA: Diagnosis not present

## 2017-09-26 MED ORDER — DOXYCYCLINE HYCLATE 100 MG PO TABS: 100.0000 mg | ORAL_TABLET | Freq: Two times a day (BID) | ORAL | 0 refills | Status: AC

## 2017-09-26 NOTE — Progress Notes (Signed)
Chief Complaint  Patient presents with  . Cough    patient complains of a cough with green sputum x3 days, denies shortness of breath, a fever or chills, used Mucinex Sinus Max with some relief  . Anorexia    lack of appetite x3 days  . Neck Pain    xrecently, worse this AM when standing up, denies an injury    HPI: Austin Rose 84 y.o. come in for  Acute problem   Here with  Daughter   For 3 days of cough nose draining and some face pain   And cough green phlegme no blood    No sob but anorexia and dec intake   Daughter made him take water   Ate fish sandwich yesterday  Was using  mucinex with tylneol and phenylephrin   Last does yesterday   Awake with  Neck pain ( hx of same   Arthritis)  No falling   Vomiting  Fever chills      Feeling bad and disoriented  Some face pain    Better now  ROS: See pertinent positives and negatives per HPI. No cp sob changes per patient   Bleeding   Others sick   Past Medical History:  Diagnosis Date  . Acute metabolic encephalopathy 09/19/3708  . Arthritis   . CAD (coronary artery disease)   . Cancer ([place])   . Confusion 07/16/2016  . History of stress test 09/20/2008  . Hx of echocardiogram 07/20/2010   The cavity size was normal, there is mild aortic stenosis,the aortic root was normal is size, the ascending aorta was normal in size.  [name] Hx of varicella   . Hyperlipemia   . Hypertension   . Orthostatic hypotension    [facility]    Family History  Problem Relation Age of Onset  . Other Mother 15       brain aneursym  . Anuerysm Mother   . Heart attack Father     Social History   Socioeconomic History  . Marital status: Married    Spouse name: Not on file  . Number of children: Not on file  . Years of education: Not on file  . Highest education level: Not on file  Occupational History  . Not on file  Social Needs  . Financial resource strain: Not on file  . Food insecurity:    Worry: Not on file    Inability: Not  on file  . Transportation needs:    Medical: Not on file    Non-medical: Not on file  Tobacco Use  . Smoking status: Former Research scientist (life sciences)  . Smokeless tobacco: Current User    Types: Chew    Last attempt to quit: 04/15/1978  Substance and Sexual Activity  . Alcohol use: No  . Drug use: No  . Sexual activity: Never  Lifestyle  . Physical activity:    Days per week: Not on file    Minutes per session: Not on file  . Stress: Not on file  Relationships  . Social connections:    Talks on phone: Not on file    Gets together: Not on file    Attends religious service: Not on file    Active member of club or organization: Not on file    Attends meetings of clubs or organizations: Not on file    Relationship status: Not on file  Other Topics Concern  . 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   2 use tobacco no significant alcohol   No ets   Has fa   Son moved in to help with caretaking              Outpatient Medications Prior to Visit  Medication Sig Dispense Refill  . acetaminophen (TYLENOL) 500 MG tablet Take 500 mg by mouth every 6 (six) hours as needed.    [name] aspirin 325 MG tablet Take 1 tablet (325 mg total) by mouth daily.    [name] gabapentin (NEURONTIN) 300 MG capsule Take 300 mg by mouth 3 (three) times daily.    . metoprolol tartrate (LOPRESSOR) 25 MG tablet Take 1 tablet (25 mg total) by mouth 2 (two) times daily. 60 tablet 0  . midodrine (PROAMATINE) 5 MG tablet Take 5 mg by mouth 3 (three) times daily with meals. 1 tab  8AM, 1 PM and 1 at 6 PM- Pt gets thru the [place]     . simvastatin (ZOCOR) 20 MG tablet Take 0.5 tablets by mouth daily.     No facility-administered medications prior to visit.      EXAM:  BP 110/64 (BP Location: Left Arm, Patient Position: Sitting, Cuff Size: Normal)   Pulse 62   Temp (!) 97.5 F (36.4 C) (Oral)    Ht 5\' 4"  (1.626 m)   Wt 154 lb 3.2 oz (69.9 kg)   BMI 26.47 kg/m   Body mass index is 26.47 kg/m.  GENERAL: vitals reviewed and listed above, alert, non toxic   Mild congestion   Pleasant  No cough  During exam   and in no acute distress HEENT: atraumatic, conjunctiva  clear, no obvious abnormalities on inspection of external nose and ears nares mild congestion face nontender  OP : no lesion edema or exudate  Edentulous  NECK: no obvious masses on inspection palpation  LUNGS: no rales rhonchi  ? If dec bs right lower middle area ?  No retractions  CV: HRRR, repeat 62 ocass  Skipped beat  1-2/6 sem non radiation  no clubbing cyanosis or  peripheral edema nl cap refill  MS: moves all extremities neuropathic gait  Uses cane  PSYCH: pleasant and cooperative,   BP Readings from Last 3 Encounters:  09/26/17 110/64  07/25/17 138/68  01/27/17 122/84    ASSESSMENT AND PLAN:  Discussed the following assessment and plan:  Respiratory tract congestion with cough - ? sinusitis  consdier pneumonia - Plan: DG Chest 2 View  Coronary artery disease due to lipid rich plaque  Advanced age resp infection with anorexia  High risk   But looks  stable at this time  X the initial high pulse  That is normal on exam.  Chest x ray  And  Antibiotic today    Expectant management. Low threshold for revevalulation  No decongestant   Ok for plain mucinex if needed   Repeat pulse is normal  Apical on exam   fyi can take pcn when last in hospital last year when had urosepsis -Patient advised to return or notify health care team  if  new concerns arise.  Patient Instructions  Respiratory infection   R/o pneumonia   Repeat  Pulse  62  .  I will be starting  an antibiotic depending on  he x ray  result    Would  Use plain mucinex   Avoid decongestants for now  Cause possible side effects .   Stay hydrated   Expect  impmrovement in the next 3-4 days   Seek care if   Alarming symptoms     Standley Brooking.  Yoona Ishii M.D.

## 2017-09-26 NOTE — Patient Instructions (Addendum)
Respiratory infection   R/o pneumonia   Repeat  Pulse  62  .  I will be starting  an antibiotic depending on  he x ray result    Would  Use plain mucinex   Avoid decongestants for now  Cause possible side effects .   Stay hydrated   Expect  impmrovement in the next 3-4 days   Seek care if   Alarming symptoms

## 2017-11-10 ENCOUNTER — Emergency Department (HOSPITAL_COMMUNITY): Payer: Medicare HMO

## 2017-11-10 ENCOUNTER — Other Ambulatory Visit: Payer: Self-pay

## 2017-11-10 ENCOUNTER — Encounter (HOSPITAL_COMMUNITY): Payer: Self-pay

## 2017-11-10 ENCOUNTER — Emergency Department (HOSPITAL_COMMUNITY)
Admission: EM | Admit: 2017-11-10 | Discharge: 2017-11-10 | Disposition: A | Discharge status: Home / Self Care | Payer: Medicare HMO | Attending: Emergency Medicine | Admitting: Emergency Medicine

## 2017-11-10 DIAGNOSIS — Y999 Unspecified external cause status: Secondary | ICD-10-CM | POA: Diagnosis not present

## 2017-11-10 DIAGNOSIS — Y929 Unspecified place or not applicable: Secondary | ICD-10-CM | POA: Insufficient documentation

## 2017-11-10 DIAGNOSIS — Y939 Activity, unspecified: Secondary | ICD-10-CM | POA: Diagnosis not present

## 2017-11-10 DIAGNOSIS — M25562 Pain in left knee: Secondary | ICD-10-CM | POA: Insufficient documentation

## 2017-11-10 DIAGNOSIS — S0990XA Unspecified injury of head, initial encounter: Secondary | ICD-10-CM | POA: Diagnosis not present

## 2017-11-10 DIAGNOSIS — W19XXXA Unspecified fall, initial encounter: Secondary | ICD-10-CM | POA: Diagnosis not present

## 2017-11-10 DIAGNOSIS — Z23 Encounter for immunization: Secondary | ICD-10-CM | POA: Insufficient documentation

## 2017-11-10 DIAGNOSIS — S8992XA Unspecified injury of left lower leg, initial encounter: Secondary | ICD-10-CM | POA: Diagnosis not present

## 2017-11-10 DIAGNOSIS — M7989 Other specified soft tissue disorders: Secondary | ICD-10-CM | POA: Diagnosis not present

## 2017-11-10 DIAGNOSIS — S199XXA Unspecified injury of neck, initial encounter: Secondary | ICD-10-CM | POA: Diagnosis not present

## 2017-11-10 DIAGNOSIS — M542 Cervicalgia: Secondary | ICD-10-CM | POA: Insufficient documentation

## 2017-11-10 DIAGNOSIS — S0101XA Laceration without foreign body of scalp, initial encounter: Secondary | ICD-10-CM | POA: Insufficient documentation

## 2017-11-10 MED ORDER — TETANUS-DIPHTH-ACELL PERTUSSIS 5-2.5-18.5 LF-MCG/0.5 IM SUSP
0.5000 mL | Freq: Once | INTRAMUSCULAR | Status: AC
  Administered 2017-11-10: 0.5 mL via INTRAMUSCULAR
  Filled 2017-11-10: qty 0.5

## 2017-11-10 NOTE — ED Provider Notes (Signed)
Patient placed in Quick Look pathway, seen and evaluated   Chief Complaint: fall, head injury  HPI:   Presents for evaluation after mechanical fall, striking the back of his head where he sustained a laceration, there is posterior neck pain, patient also reports pain and swelling to the left knee.  Family reports history of increased falls over the past year.  No loss of consciousness, no nausea, vomiting, vision changes, numbness or weakness.  ROS: + Head injury, neck pain, laceration, knee pain. -Chest pain, shortness of breath, abdominal pain  Physical Exam:   Gen: No distress  Neuro: Awake and Alert  Skin: Warm    Focused Exam: Laceration across the back of the head with surrounding hematoma, normal neurologic exam, tenderness and swelling over the left knee, + midline C-spine tenderness  Initiation of care has begun. The patient has been counseled on the process, plan, and necessity for staying for the completion/evaluation, and the remainder of the medical screening examination   Janet Berlin 11/10/17 Harlan, Kevin, MD 11/10/17 [id number]

## 2017-11-10 NOTE — ED Notes (Signed)
Pt st's he fell on his front porch and hit his head on a pole.  Pt denies any pain and st's he is ready to go home.  Pt has small lac to back of head.  No bleeding at this time.  Pt denies LOC

## 2017-11-10 NOTE — ED Triage Notes (Signed)
Patient here from home for a fall with a lac to posterior head and knee pain.  A&Ox4, unknown tetanus.

## 2017-11-10 NOTE — Discharge Instructions (Addendum)
Staple removal in 10 days.

## 2017-11-13 ENCOUNTER — Encounter (HOSPITAL_COMMUNITY): Payer: Self-pay | Admitting: Emergency Medicine

## 2017-11-13 ENCOUNTER — Emergency Department (HOSPITAL_COMMUNITY)
Admission: EM | Admit: 2017-11-13 | Discharge: 2017-12-14 | Disposition: E | Payer: Medicare HMO | Attending: Emergency Medicine | Admitting: Emergency Medicine

## 2017-11-13 DIAGNOSIS — Z859 Personal history of malignant neoplasm, unspecified: Secondary | ICD-10-CM | POA: Diagnosis not present

## 2017-11-13 DIAGNOSIS — Z8673 Personal history of transient ischemic attack (TIA), and cerebral infarction without residual deficits: Secondary | ICD-10-CM | POA: Diagnosis not present

## 2017-11-13 DIAGNOSIS — Z978 Presence of other specified devices: Secondary | ICD-10-CM | POA: Diagnosis not present

## 2017-11-13 DIAGNOSIS — Z79899 Other long term (current) drug therapy: Secondary | ICD-10-CM | POA: Insufficient documentation

## 2017-11-13 DIAGNOSIS — Z7982 Long term (current) use of aspirin: Secondary | ICD-10-CM | POA: Insufficient documentation

## 2017-11-13 DIAGNOSIS — I10 Essential (primary) hypertension: Secondary | ICD-10-CM | POA: Insufficient documentation

## 2017-11-13 DIAGNOSIS — R1013 Epigastric pain: Secondary | ICD-10-CM

## 2017-11-13 DIAGNOSIS — I469 Cardiac arrest, cause unspecified: Secondary | ICD-10-CM | POA: Diagnosis not present

## 2017-11-13 DIAGNOSIS — R1012 Left upper quadrant pain: Secondary | ICD-10-CM | POA: Diagnosis not present

## 2017-11-13 DIAGNOSIS — R109 Unspecified abdominal pain: Secondary | ICD-10-CM | POA: Diagnosis not present

## 2017-11-13 DIAGNOSIS — J9601 Acute respiratory failure with hypoxia: Secondary | ICD-10-CM | POA: Diagnosis not present

## 2017-11-13 DIAGNOSIS — I2510 Atherosclerotic heart disease of native coronary artery without angina pectoris: Secondary | ICD-10-CM | POA: Insufficient documentation

## 2017-11-13 DIAGNOSIS — Z87891 Personal history of nicotine dependence: Secondary | ICD-10-CM | POA: Insufficient documentation

## 2017-11-13 DIAGNOSIS — R1011 Right upper quadrant pain: Secondary | ICD-10-CM | POA: Diagnosis not present

## 2017-11-13 DIAGNOSIS — R0600 Dyspnea, unspecified: Secondary | ICD-10-CM | POA: Diagnosis not present

## 2017-11-13 DIAGNOSIS — Z951 Presence of aortocoronary bypass graft: Secondary | ICD-10-CM | POA: Diagnosis not present

## 2017-11-13 LAB — CBC
HCT: 31.1 % — ABNORMAL LOW (ref 39.0–52.0)
Hemoglobin: 9.5 g/dL — ABNORMAL LOW (ref 13.0–17.0)
MCH: 28.1 pg (ref 26.0–34.0)
MCHC: 30.5 g/dL (ref 30.0–36.0)
MCV: 92 fL (ref 78.0–100.0)
Platelets: 178 K/uL (ref 150–400)
RBC: 3.38 MIL/uL — ABNORMAL LOW (ref 4.22–5.81)
RDW: 15 % (ref 11.5–15.5)
WBC: 11.4 K/uL — ABNORMAL HIGH (ref 4.0–10.5)

## 2017-11-13 LAB — I-STAT TROPONIN, ED: Troponin i, poc: 0.12 ng/mL (ref 0.00–0.08)

## 2017-11-13 NOTE — ED Notes (Signed)
Bobby-RN @ [facility] notified of elevated I-stat trop of 0.12

## 2017-11-13 NOTE — ED Triage Notes (Signed)
Patient reports generalized abdominal pain for 2 days with diarrhea x2 today , no emesis , no fever or chills .

## 2017-11-13 NOTE — ED Notes (Addendum)
Dr. Hillard Danker notified on pt.'s elevated Troponin result , will move to next available bed ASAP , charge nurse notified .

## 2017-11-14 ENCOUNTER — Emergency Department (HOSPITAL_COMMUNITY): Payer: Medicare HMO

## 2017-11-14 ENCOUNTER — Telehealth: Payer: Self-pay | Admitting: Internal Medicine

## 2017-11-14 DIAGNOSIS — I469 Cardiac arrest, cause unspecified: Secondary | ICD-10-CM

## 2017-11-14 DIAGNOSIS — J9601 Acute respiratory failure with hypoxia: Secondary | ICD-10-CM | POA: Diagnosis not present

## 2017-11-14 DIAGNOSIS — R0600 Dyspnea, unspecified: Secondary | ICD-10-CM | POA: Diagnosis not present

## 2017-11-14 DIAGNOSIS — Z978 Presence of other specified devices: Secondary | ICD-10-CM

## 2017-11-14 DIAGNOSIS — R109 Unspecified abdominal pain: Secondary | ICD-10-CM | POA: Diagnosis not present

## 2017-11-14 LAB — COMPREHENSIVE METABOLIC PANEL
ALT: 164 U/L — ABNORMAL HIGH (ref 0–44)
AST: 196 U/L — ABNORMAL HIGH (ref 15–41)
Albumin: 2.9 g/dL — ABNORMAL LOW (ref 3.5–5.0)
Alkaline Phosphatase: 416 U/L — ABNORMAL HIGH (ref 38–126)
Anion gap: 11 (ref 5–15)
BUN: 22 mg/dL (ref 8–23)
CHLORIDE: 105 mmol/L (ref 98–111)
CO2: 21 mmol/L — AB (ref 22–32)
Calcium: 8.5 mg/dL — ABNORMAL LOW (ref 8.9–10.3)
Creatinine, Ser: 1.45 mg/dL — ABNORMAL HIGH (ref 0.61–1.24)
GFR calc non Af Amer: 41 mL/min — ABNORMAL LOW (ref >60)
GFR, EST AFRICAN AMERICAN: 47 mL/min — AB (ref >60)
Glucose, Bld: 138 mg/dL — ABNORMAL HIGH (ref 70–99)
Potassium: 3.6 mmol/L (ref 3.5–5.1)
SODIUM: 137 mmol/L (ref 135–145)
Total Bilirubin: 3.3 mg/dL — ABNORMAL HIGH (ref 0.3–1.2)
Total Protein: 5.7 g/dL — ABNORMAL LOW (ref 6.5–8.1)

## 2017-11-14 LAB — I-STAT TROPONIN, ED: Troponin i, poc: 0.1 ng/mL (ref 0.00–0.08)

## 2017-11-14 LAB — LIPASE, BLOOD: LIPASE: 35 U/L (ref 11–51)

## 2017-11-14 MED ORDER — EPINEPHRINE PF 1 MG/10ML IJ SOSY
PREFILLED_SYRINGE | INTRAMUSCULAR | Status: AC | PRN
  Administered 2017-11-14 (×2): 0.1 mg via INTRAVENOUS

## 2017-11-14 MED ORDER — MORPHINE SULFATE (PF) 4 MG/ML IV SOLN: 2.0000 mg | INTRAVENOUS | Status: DC | PRN

## 2017-11-14 MED ORDER — IPRATROPIUM-ALBUTEROL 0.5-2.5 (3) MG/3ML IN SOLN
3.0000 mL | Freq: Once | RESPIRATORY_TRACT | Status: AC
  Administered 2017-11-14: 3 mL via RESPIRATORY_TRACT

## 2017-11-14 MED ORDER — EPINEPHRINE PF 1 MG/10ML IJ SOSY
PREFILLED_SYRINGE | INTRAMUSCULAR | Status: AC | PRN
  Administered 2017-11-14: 0.1 mg via INTRAVENOUS

## 2017-11-14 MED ORDER — NOREPINEPHRINE 4 MG/250ML-% IV SOLN
0.0000 ug/min | Freq: Once | INTRAVENOUS | Status: AC
  Administered 2017-11-14: 5 ug/min via INTRAVENOUS
  Filled 2017-11-14: qty 250

## 2017-11-14 MED ORDER — ATROPINE SULFATE 1 MG/ML IJ SOLN
INTRAMUSCULAR | Status: AC | PRN
  Administered 2017-11-14: 1 mg via INTRAVENOUS

## 2017-11-14 MED ORDER — SODIUM CHLORIDE 0.9 % IV BOLUS
500.0000 mL | Freq: Once | INTRAVENOUS | Status: AC
  Administered 2017-11-14: 500 mL via INTRAVENOUS

## 2017-11-14 MED ORDER — IPRATROPIUM-ALBUTEROL 0.5-2.5 (3) MG/3ML IN SOLN
RESPIRATORY_TRACT | Status: AC
  Filled 2017-11-14: qty 3

## 2017-11-14 MED ORDER — IOHEXOL 300 MG/ML  SOLN
100.0000 mL | Freq: Once | INTRAMUSCULAR | Status: AC | PRN
  Administered 2017-11-14: 100 mL via INTRAVENOUS

## 2017-11-14 MED FILL — Medication: Qty: 1 | Status: AC

## 2017-11-21 ENCOUNTER — Telehealth: Payer: Self-pay | Admitting: Internal Medicine

## 2017-11-21 ENCOUNTER — Ambulatory Visit: Payer: Medicare HMO | Admitting: Internal Medicine

## 2017-11-21 NOTE — Telephone Encounter (Signed)
Signed.

## 2017-11-21 NOTE — Telephone Encounter (Signed)
Completed and given to [facility] up front.  Called [facility], made them aware DC is ready for pick up.  Nothing further needed.

## 2017-11-21 NOTE — Telephone Encounter (Signed)
Death Certificate received - given to Dr Regis Bill to sign.

## 2017-12-14 NOTE — Code Documentation (Signed)
Patient time of death occurred at 73.

## 2017-12-14 NOTE — Progress Notes (Signed)
   11/27/2017 0600  Clinical Encounter Type  Visited With Patient;Patient and family together  Visit Type Initial  Referral From Nurse  Consult/Referral To Chaplain  Spiritual Encounters  Spiritual Needs Emotional;Grief support    This was a page for a 84 yr old male caucasian. Pt passed. Family present and tearful. Chaplain provided pt placement card and compassionate presence.  Rachal Dvorsky a Medical sales representative, [employer]

## 2017-12-14 NOTE — Telephone Encounter (Signed)
Called by Dr. Stark Jock this am pt went to [facility] ED for abdominal pain then had respiratory failure 11/27/2017 CT negative, trop 0.12 then 0.10 EKG with lateral ischemic changes full code attempted critical care consulted and pt expired   Will need death certificate filled out by PCP   [place]

## 2017-12-14 NOTE — ED Notes (Signed)
Patients family placed in Consultation A.

## 2017-12-14 NOTE — ED Notes (Signed)
Unresponsive episode lasting approx 30 seconds, apneic but no loss of pulses while in CT scanner. MD delo at bedside

## 2017-12-14 NOTE — ED Notes (Signed)
Dr. Delo at bedside. 

## 2017-12-14 NOTE — Code Documentation (Addendum)
CPR stopped; Pulse found by doppler

## 2017-12-14 NOTE — ED Notes (Signed)
Dr.Delo made aware of pt's sudden onset of increased SOB  IV attempted x2 without success

## 2017-12-14 NOTE — Code Documentation (Addendum)
Pt extubated and Levo D/C'd

## 2017-12-14 NOTE — ED Provider Notes (Signed)
[place] EMERGENCY DEPARTMENT Provider Note   CSN: [id number] Arrival date & time: 11/29/2017  2302     History   Chief Complaint Chief Complaint  Patient presents with  . Abdominal Pain    HPI Austin Rose is a 84 y.o. male.  Patient is a 84 year old male with past medical history of coronary artery disease with CABG many years ago, hypertension.  He presents today for evaluation of abdominal pain.  This started earlier this evening.  He reports pain across his upper abdomen with several episodes of loose stool.  All has been nonbloody.  He denies any nausea or vomiting.  He denies any ill contacts.  The history is provided by the patient.  Abdominal Pain   This is a new problem. Episode onset: This evening. The problem occurs constantly. The problem has been gradually worsening. The pain is associated with an unknown factor. The pain is located in the epigastric region, RUQ and LUQ. The quality of the pain is cramping. The pain is moderate. Pertinent negatives include fever, flatus and constipation. Nothing aggravates the symptoms. Nothing relieves the symptoms.    Past Medical History:  Diagnosis Date  . Acute metabolic encephalopathy 2/97/9892  . Arthritis   . CAD (coronary artery disease)   . Cancer ([place])   . Confusion 07/16/2016  . History of stress test 09/20/2008  . Hx of echocardiogram 07/20/2010   The cavity size was normal, there is mild aortic stenosis,the aortic root was normal is size, the ascending aorta was normal in size.  [name] Hx of varicella   . Hyperlipemia   . Hypertension   . Orthostatic hypotension    [facility]    Patient Active Problem List   Diagnosis Date Noted  . Advanced age 78/03/2018  . Atrial tachycardia ([place])   . Pressure injury of skin 11/12/2016  . Sepsis ([place]) 11/11/2016  . Acute kidney injury ([place]) 11/11/2016  . Hypertension 11/11/2016  . CAD (coronary artery disease) 11/11/2016  . Arthritis 11/11/2016    . Elevated troponin 11/11/2016  . Thrombocytopenia, idiopathic ([place]) 11/11/2016  . Orthostatic hypotension 11/11/2016  . Aortic stenosis, moderate w/ mild pulmonary HTN 11/11/2016  . Hyperlipemia 11/11/2016  . Sepsis secondary to UTI ([place])   . Cyanocobalamin deficiency 07/16/2016  . Coronary artery disease involving native coronary artery of native heart without angina pectoris   . Arterial hypotension   . History of cerebral infarction 06/04/2016  . Hearing decreased, unspecified laterality 06/04/2016  . Restless legs 06/04/2016  . Multiple lacunar infarcts ([place]) 04/30/2016  . Calculus of gallbladder without cholecystitis without obstruction 04/30/2016  . Thrombocytopenia ([place]) 04/30/2016  . Expressive aphasia 04/18/2016  . CAD (coronary artery disease) of artery bypass graft 04/18/2016  . TIA (transient ischemic attack) 04/17/2016  . Senile ecchymosis 09/20/2014  . Hyperlipidemia 06/07/2014  . Primary osteoarthritis involving multiple joints 01/20/2014  . Coronary artery disease due to lipid rich plaque 01/20/2014  . Age factor 01/20/2014  . Medication side effect 01/20/2014  . Smokeless tobacco use within past 30 days 06/03/2013  . Skin abnormalities 05/18/2013  . Pain management 05/18/2013  . Abnormal TSH 05/18/2013  . Unspecified vitamin D deficiency 05/18/2013  . Sleep difficulties 11/06/2011  . High risk medication use 04/10/2011  . Chronic orthostatic hypotension 05/16/2010  . Labyrinthitis 05/16/2010  . DEGENERATIVE JOINT DISEASE, SHOULDER 07/20/2008  . ARTHRITIS, KNEES, BILATERAL 07/20/2008  . FROZEN RIGHT SHOULDER 07/20/2008  . Hypothyroidism 11/11/2007  . Hereditary and idiopathic  peripheral neuropathy 11/11/2007  . CONSTIPATION, CHRONIC 11/11/2007  . OSTEOPENIA 11/11/2007  . GASTRIC ULCER, HX OF 11/11/2007  . HYPERLIPIDEMIA 03/31/2007  . Essential hypertension 03/31/2007  . Coronary atherosclerosis 03/31/2007  . IBS 03/31/2007    Past Surgical History:   Procedure Laterality Date  . BACK SURGERY     15s1Repeat Foraminotomy  . cataract surgery     [place]   . CORONARY ARTERY BYPASS GRAFT  04/1999   Dr Roxan Hockey, showing 4 vessel disease and underwent multivessel CABG aththat time.  . LUMBAR DISC SURGERY     L4-5  decompression  . microdisscectomy     bilateral  . OTHER SURGICAL HISTORY     central decompression        Home Medications    Prior to Admission medications   Medication Sig Start Date End Date Taking? Authorizing Provider  acetaminophen (TYLENOL) 500 MG tablet Take 500 mg by mouth every 6 (six) hours as needed.    [provider]  aspirin 325 MG tablet Take 1 tablet (325 mg total) by mouth daily. 04/20/16   Theodis Blaze, MD  doxycycline (VIBRA-TABS) 100 MG tablet Take 1 tablet (100 mg total) by mouth 2 (two) times daily. 09/26/17   Panosh, Standley Brooking, MD  gabapentin (NEURONTIN) 300 MG capsule Take 300 mg by mouth 3 (three) times daily.    [provider]  metoprolol tartrate (LOPRESSOR) 25 MG tablet Take 1 tablet (25 mg total) by mouth 2 (two) times daily. 11/19/16   Hosie Poisson, MD  midodrine (PROAMATINE) 5 MG tablet Take 5 mg by mouth 3 (three) times daily with meals. 1 tab  8AM, 1 PM and 1 at 6 PM- Pt gets thru the [facility]  08/16/10   [provider]  simvastatin (ZOCOR) 20 MG tablet Take 0.5 tablets by mouth daily.    [provider]    Family History Family History  Problem Relation Age of Onset  . Other Mother 12       brain aneursym  . Anuerysm Mother   . Heart attack Father     Social History Social History   Tobacco Use  . Smoking status: Former Research scientist (life sciences)  . Smokeless tobacco: Current User    Types: Chew    Last attempt to quit: 04/15/1978  Substance Use Topics  . Alcohol use: No  . Drug use: No     Allergies   Fentanyl; Gabapentin; Norco [hydrocodone-acetaminophen]; Tramadol; and Penicillins   Review of Systems Review of Systems  Constitutional: Negative for fever.   Gastrointestinal: Positive for abdominal pain. Negative for constipation and flatus.  All other systems reviewed and are negative.    Physical Exam Updated Vital Signs BP 127/63   Pulse (!) 106   Temp (!) 97.5 F (36.4 C) (Oral)   Resp 20   SpO2 93%   Physical Exam  Constitutional: He is oriented to person, place, and time. He appears well-developed and well-nourished. No distress.  HENT:  Head: Normocephalic and atraumatic.  Mouth/Throat: Oropharynx is clear and moist.  Neck: Normal range of motion. Neck supple.  Cardiovascular: Normal rate and regular rhythm. Exam reveals no friction rub.  No murmur heard. Pulmonary/Chest: Effort normal and breath sounds normal. No respiratory distress. He has no wheezes. He has no rales.  Abdominal: Soft. Bowel sounds are normal. He exhibits no distension. There is tenderness in the right upper quadrant, epigastric area and left upper quadrant. There is no rigidity, no rebound and no guarding.  Musculoskeletal:  Normal range of motion. He exhibits no edema.  Neurological: He is alert and oriented to person, place, and time. Coordination normal.  Skin: Skin is warm and dry. He is not diaphoretic.  Nursing note and vitals reviewed.    ED Treatments / Results  Labs (all labs ordered are listed, but only abnormal results are displayed) Labs Reviewed  COMPREHENSIVE METABOLIC PANEL - Abnormal; Notable for the following components:      Result Value   CO2 21 (*)    Glucose, Bld 138 (*)    Creatinine, Ser 1.45 (*)    Calcium 8.5 (*)    Total Protein 5.7 (*)    Albumin 2.9 (*)    AST 196 (*)    ALT 164 (*)    Alkaline Phosphatase 416 (*)    Total Bilirubin 3.3 (*)    GFR calc non Af Amer 41 (*)    GFR calc Af Amer 47 (*)    All other components within normal limits  CBC - Abnormal; Notable for the following components:   WBC 11.4 (*)    RBC 3.38 (*)    Hemoglobin 9.5 (*)    HCT 31.1 (*)    All other components within normal limits   I-STAT TROPONIN, ED - Abnormal; Notable for the following components:   Troponin i, poc 0.12 (*)    All other components within normal limits  LIPASE, BLOOD  URINALYSIS, ROUTINE W REFLEX MICROSCOPIC    EKG None  Radiology No results found.  Procedures Procedures (including critical care time)  Medications Ordered in ED Medications  sodium chloride 0.9 % bolus 500 mL (has no administration in time range)     Initial Impression / Assessment and Plan / ED Course  I have reviewed the triage vital signs and the nursing notes.  Pertinent labs & imaging results that were available during my care of the patient were reviewed by me and considered in my medical decision making (see chart for details).  Patient is a 84 year old male presenting with complaints of abdominal pain.  He was initially hemodynamically stable and afebrile.  Laboratory studies revealed a mild elevation of his white count and liver function tests.  Due to his advanced age, the decision was made to obtain a CT scan of the abdomen and pelvis.  Shortly before this test was to be performed, he did experience one episode of dyspnea.  He was given an albuterol treatment as he appeared to be wheezing on exam.  He was then sent for his CT scan.  An EKG was repeated and showed what appeared to be ischemic changes in the lateral leads.  This finding was discussed with cardiology (Dr. Paticia Stack), who recommended proceeding with a medical work-up for a possible cause of what may be demand ischemia.  Shortly after returning from radiology, I was called to the patient's room due to unresponsiveness.  He was not responding and did not appear to be breathing.  On the cardiac monitor, he was found to be severely bradycardic and a CODE BLUE was called.  CPR was initiated and respirations were administered by bag-valve-mask.  The appropriate cardiac medications were given.  On 2 separate occasions, the patient did have return of spontaneous  circulation with palpable pulses, measurable blood pressure, however he did not regain consciousness.  An endotracheal tube was placed using the glide scope.  Tube placement was confirmed with direct visualization, end-tidal CO2, and direct auscultation over the lungs and stomach.  No sedation was  necessary for this procedure due to the severity of the patient's illness.  There is no gag reflex and the tube was easily placed.  CPR was continued, however with no significant improvement in patient's status.  I discussed the care with PCCM who was also consulted on the patient.  After lengthy discussion with the family, the decision was made to make patient comfort measures and he passed away shortly thereafter.  I notify the on-call provider for [place] primary care who will forward this information onto Dr. Regis Bill, the patient's primary doctor.  CRITICAL CARE Performed by: Veryl Speak Total critical care time: 70 minutes Critical care time was exclusive of separately billable procedures and treating other patients. Critical care was necessary to treat or prevent imminent or life-threatening deterioration. Critical care was time spent personally by me on the following activities: development of treatment plan with patient and/or surrogate as well as nursing, discussions with consultants, evaluation of patient's response to treatment, examination of patient, obtaining history from patient or surrogate, ordering and performing treatments and interventions, ordering and review of laboratory studies, ordering and review of radiographic studies, pulse oximetry and re-evaluation of patient's condition.   Final Clinical Impressions(s) / ED Diagnoses   Final diagnoses:  None    ED Discharge Orders    None       Veryl Speak, MD 12-02-2017 [phone]

## 2017-12-14 NOTE — Code Documentation (Addendum)
Pt  unresponsive, CPR was initiated.

## 2017-12-14 NOTE — Code Documentation (Addendum)
CPR stopped, pulse present

## 2017-12-14 NOTE — ED Notes (Signed)
Austin Rose b/p  89

## 2017-12-14 NOTE — Code Documentation (Signed)
Pt PEA, CC MD at Bedside. CC MD, consulted Pt's family in regards to care. Family decided to make Pt DNR and to removed medical assistance.

## 2017-12-14 NOTE — ED Notes (Addendum)
Pt went unresponsive in CT Room for 30 sec. Rapid Response was called. MD to beside

## 2017-12-14 NOTE — Procedures (Addendum)
Extubation Procedure Note  Patient Details:   Name: Austin Rose DOB: September 30, 1925 MRN: [id number]   Airway Documentation:    Vent end date: 2017/11/20 Vent end time: 0349   Evaluation  O2 sats: currently acceptable Complications: No apparent complications Patient did tolerate procedure well. Bilateral Breath Sounds: Rhonchi, Diminished   No Removed per MD Milinda Cave November 20, 2017, 3:52 AM

## 2017-12-14 NOTE — Code Documentation (Addendum)
Pt intubated w/ breath sounds present bilat

## 2017-12-14 NOTE — Code Documentation (Signed)
CPR restarted.

## 2017-12-14 NOTE — Consult Note (Signed)
[facility] PCCM PROGRESS NOTE  Austin Rose  [id number] 12-01-2017   S: 84 year old male with PMH as below, which is significant for CAD s/p CABG 20 years ago. Was in ED 2 days ago, after falling and striking his head. CT was negative at that time and he was discharged. He again presented to [facility] on 8/1 with complaints of abdominal pain and non-bloody diarrhea. During the workup he suffered cardiac arrest with approximately 30 minutes of off and on ACLS. He was hypotensive and started on pressors. PCCM asked to eval for admission.  O: BP (!) 61/48   Pulse (!) 30   Temp (!) 97.5 F (36.4 C) (Oral)   Resp (!) 24   Ht 5\' 8"  (1.727 m)   SpO2 (!) 0%   BMI 23.45 kg/m   General:  Elderly male on vent Neuro:  Comatose HEENT:  [place]/AT, PERRL, no JVD Cardiovascular:  Brady, irregular, no MRG Lungs:  Clear Abdomen:  Soft, non-tender, non-distended Musculoskeletal:  No acute deformity Skin:  Grossly intact  A/P:  Cardiac arrest: etiology not fully apparent, however, he did have some ST segment changes on EKG with mildly elevated troponin. Arrest off and on for approximately 30 mins duration per ER RN. Upon PCCM arrival to ED patient unresponsive on pressors. Heart rhythm was erratic with rates from 30s to 90s. In light of the events thus far, Dr. Gilford Raid discussed goals of care with his family. They decided to pursue a transition to comfort care. The patient was given small bolus dose of morphine and was extubated. Time of death documented at 4.  Georgann Housekeeper, ACNP [place] Pulmon[facility]/Critical Care Pager [phone] or [phone]

## 2017-12-14 NOTE — Code Documentation (Addendum)
Family at beside. Family given emotional support. 

## 2017-12-14 NOTE — ED Notes (Signed)
Dr. Stark Jock made aware of Pt's SBP in low 80s. MD en route to bedside

## 2017-12-14 NOTE — Code Documentation (Addendum)
CPR paused. Pulse not found; CPR resumed

## 2017-12-14 NOTE — Code Documentation (Signed)
CPR paused for pulse check. Pulse not found, CPR resumed

## 2017-12-14 NOTE — Code Documentation (Addendum)
Pt unresponsive w/o pulse, CPR initiated

## 2017-12-14 NOTE — Code Documentation (Signed)
CPR paused for pulse check

## 2017-12-14 NOTE — Code Documentation (Addendum)
Organ procurement team notified.

## 2017-12-14 DEATH — deceased

## 2018-08-16 IMAGING — DX DG KNEE COMPLETE 4+V*L*
4 series · 4 of 4 positions shown · non-contrast
Comparison: None

CLINICAL DATA: LEFT knee pain and swelling post fall

EXAM:
LEFT KNEE - COMPLETE 4+ VIEW

[knee ap]
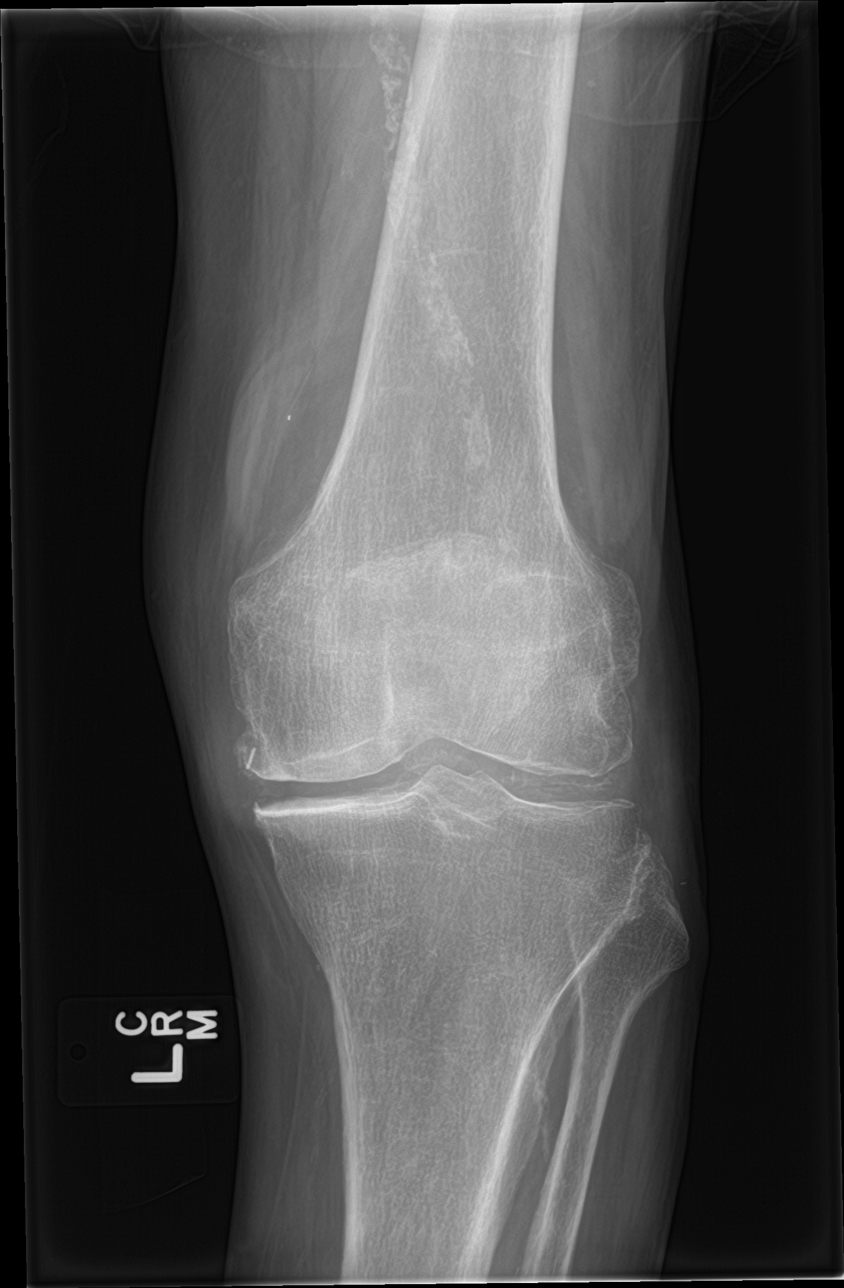

[knee lat]
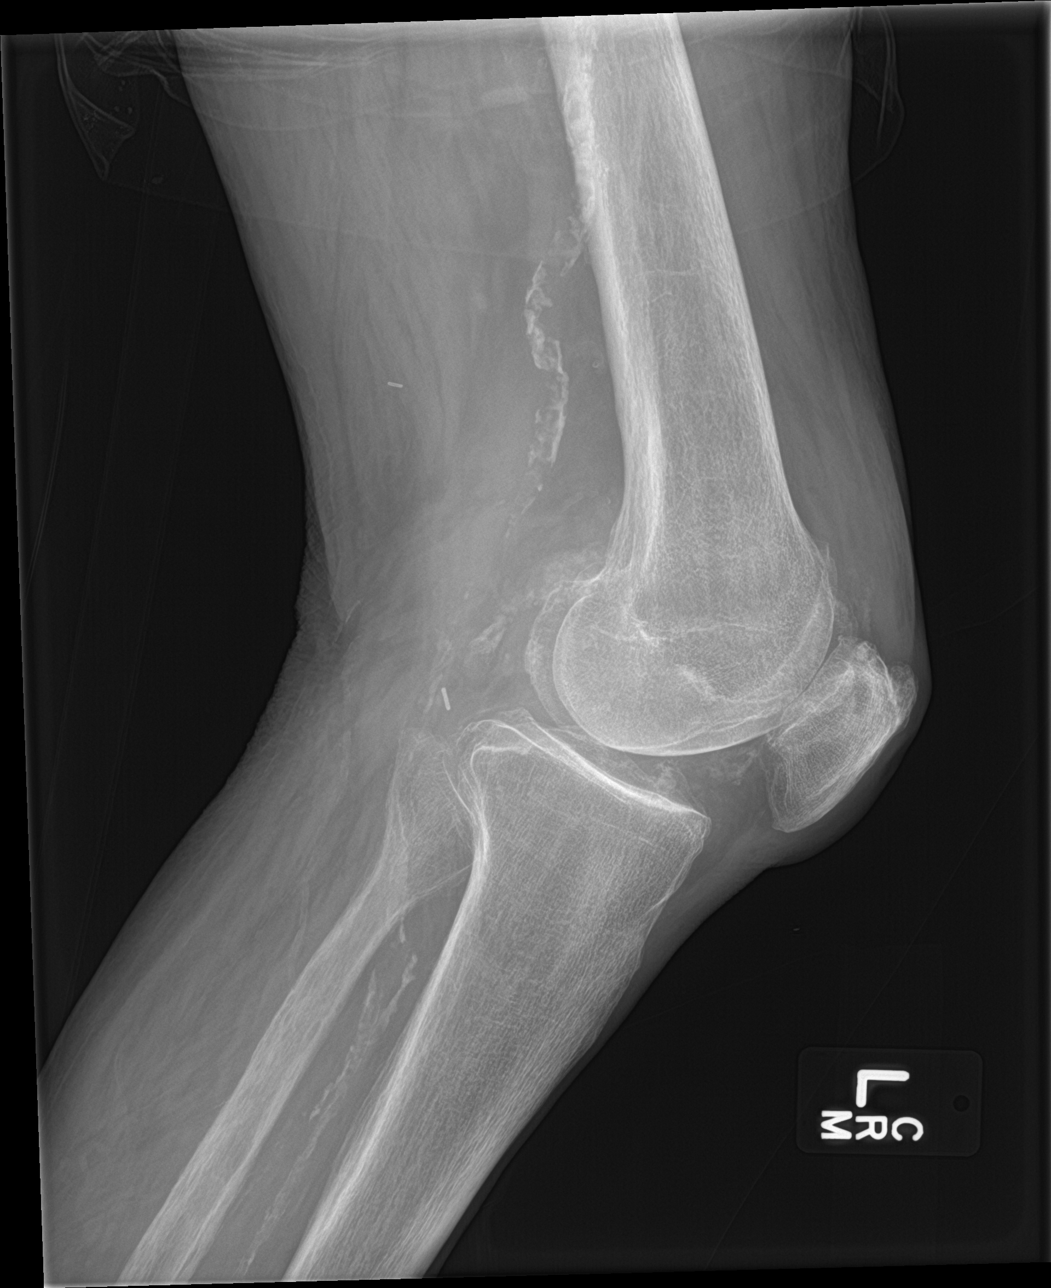

[knee obl (1 of 2)]
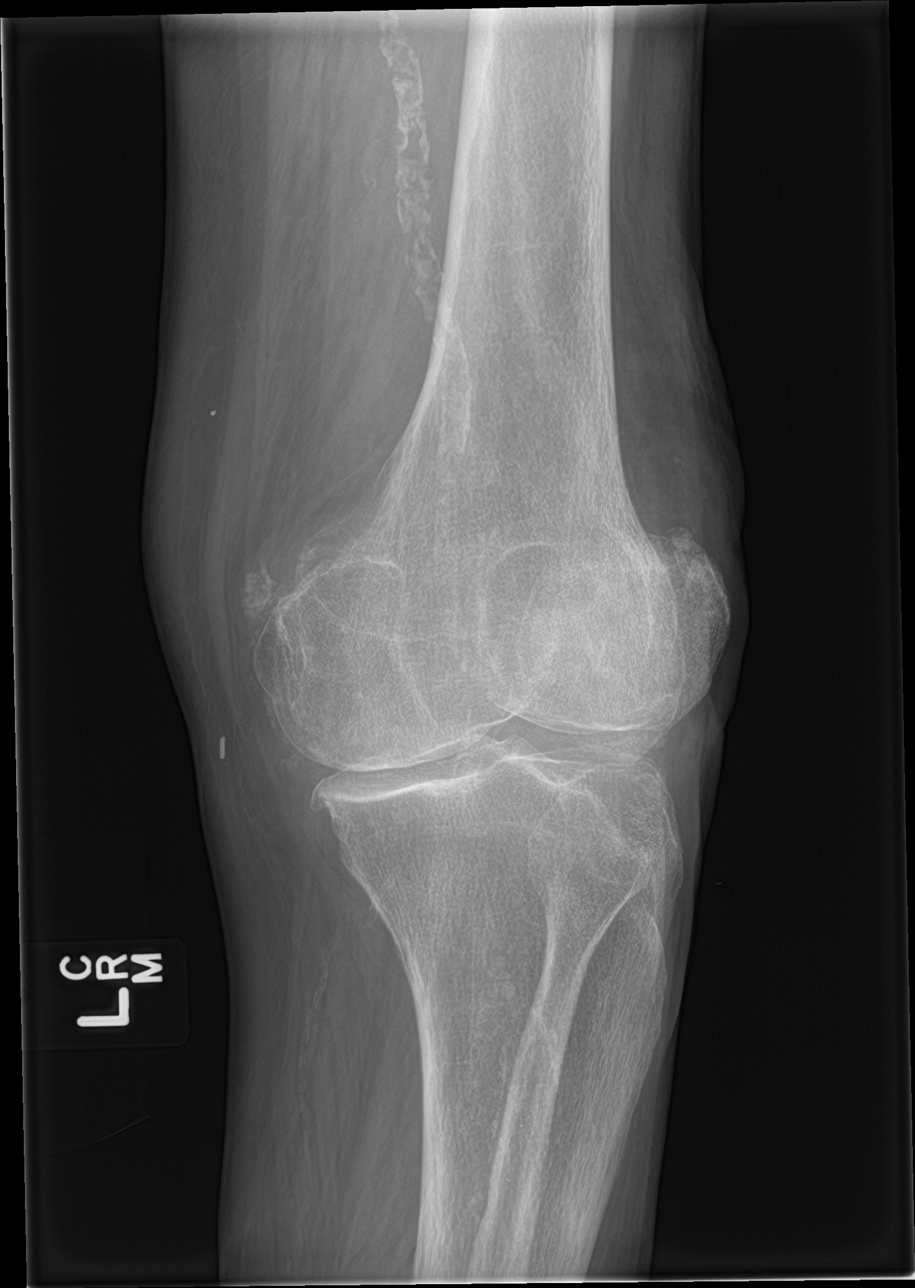

[knee obl (2 of 2)]
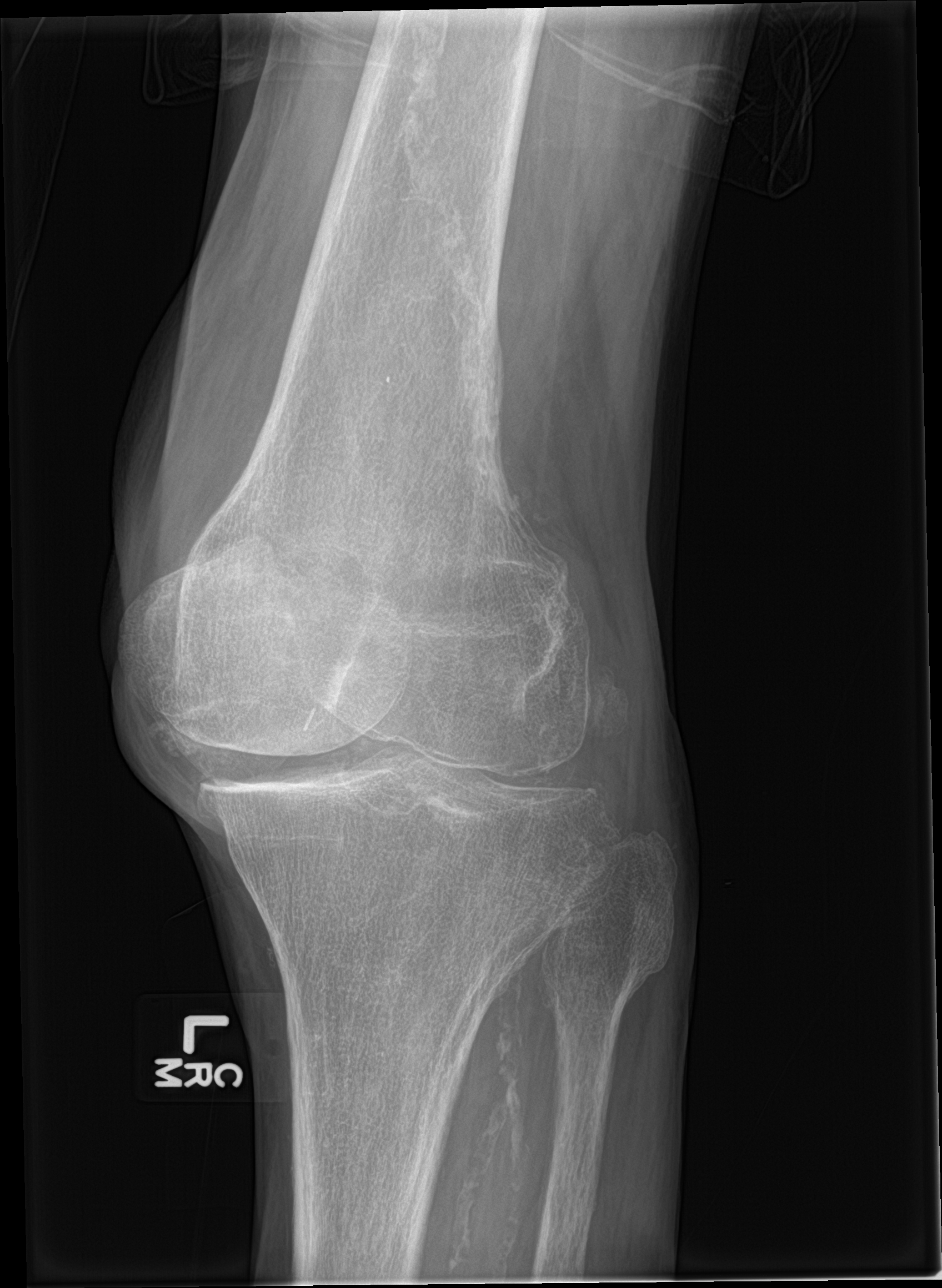

[4 of 4 positions shown; findings below may reference images not displayed]

FINDINGS: Osseous demineralization.

Tricompartmental degenerative changes with joint space narrowing and
spur formation.

Scattered chondrocalcinosis question CPPD.

Mild soft tissue swelling peripatellar and suprapatellar.

Low lying patella raising question of quadriceps tendon injury.

Atherosclerotic calcifications of the superficial femoral artery
into trifurcation vessels.
IMPRESSION: Degenerative changes and question CPPD LEFT knee.

Low lying patella raising question of quadriceps tendon injury;
consider MR assessment.

Extensive atherosclerotic calcifications.

## 2018-08-20 IMAGING — DX DG CHEST 1V PORT
1 series · 1 of 1 positions shown · non-contrast
Comparison: 09/26/2017

CLINICAL DATA: Dyspnea

EXAM:
PORTABLE CHEST 1 VIEW

[chest]
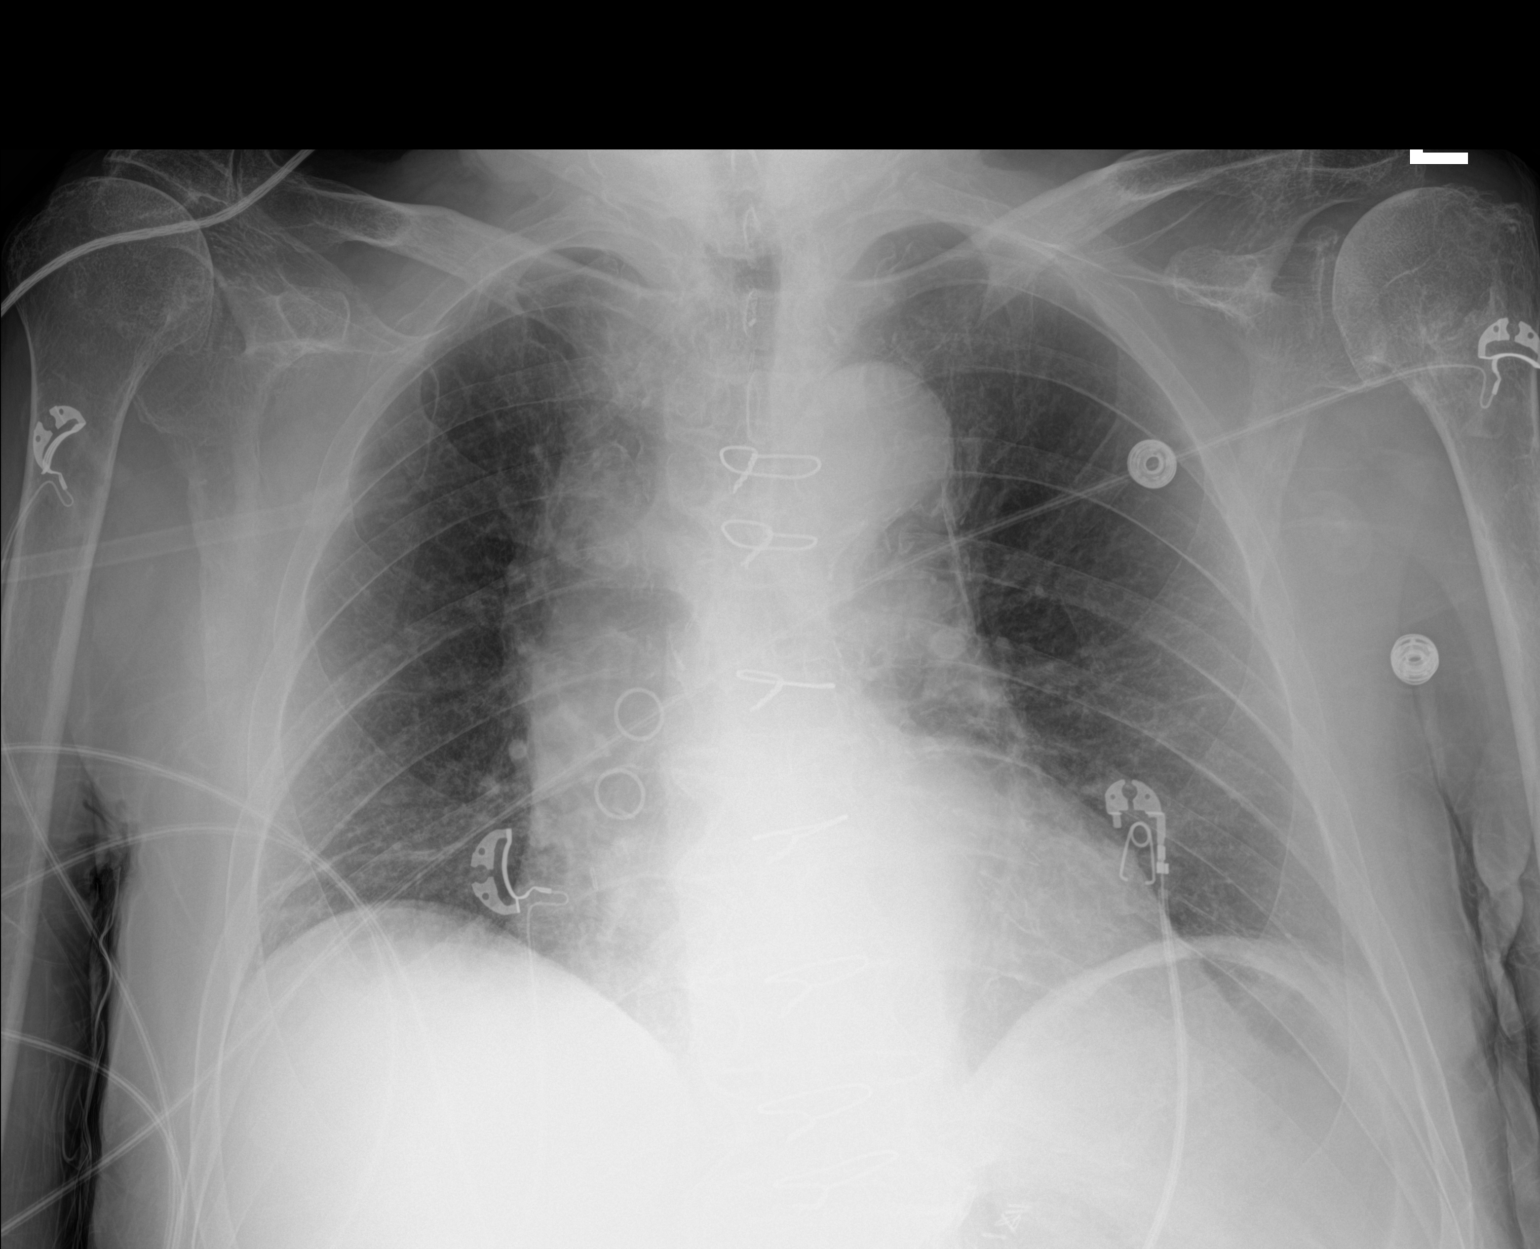

[1 of 1 positions shown; findings below may reference images not displayed]

FINDINGS: Mild cardiomegaly with tortuous atherosclerotic aorta. Status post
CABG. No acute pulmonary consolidation, effusion or pneumothorax. No
overt pulmonary edema. Degenerative changes are present about the AC
and glenohumeral joints bilaterally.
IMPRESSION: Cardiomegaly with aortic atherosclerosis. Status post CABG. No
active pulmonary disease.

## 2021-12-14 DEATH — deceased
